# Patient Record
Sex: Female | Born: 1935 | Race: White | Hispanic: No | Marital: Married | Smoking: Former smoker
Health system: Southern US, Community
[De-identification: ages and names within clinical notes are randomized; demographics above are authoritative.]

## PROBLEM LIST (undated history)

## (undated) DIAGNOSIS — M5137 Other intervertebral disc degeneration, lumbosacral region: Secondary | ICD-10-CM

## (undated) DIAGNOSIS — M810 Age-related osteoporosis without current pathological fracture: Secondary | ICD-10-CM

## (undated) DIAGNOSIS — K31819 Angiodysplasia of stomach and duodenum without bleeding: Secondary | ICD-10-CM

## (undated) DIAGNOSIS — J449 Chronic obstructive pulmonary disease, unspecified: Secondary | ICD-10-CM

## (undated) DIAGNOSIS — I82409 Acute embolism and thrombosis of unspecified deep veins of unspecified lower extremity: Secondary | ICD-10-CM

## (undated) DIAGNOSIS — Z8542 Personal history of malignant neoplasm of other parts of uterus: Secondary | ICD-10-CM

## (undated) DIAGNOSIS — E785 Hyperlipidemia, unspecified: Secondary | ICD-10-CM

## (undated) DIAGNOSIS — J45909 Unspecified asthma, uncomplicated: Secondary | ICD-10-CM

## (undated) DIAGNOSIS — N6019 Diffuse cystic mastopathy of unspecified breast: Secondary | ICD-10-CM

## (undated) DIAGNOSIS — I10 Essential (primary) hypertension: Secondary | ICD-10-CM

## (undated) HISTORY — DX: Unspecified asthma, uncomplicated: J45.909

## (undated) HISTORY — DX: Personal history of malignant neoplasm of other parts of uterus: Z85.42

## (undated) HISTORY — DX: Other intervertebral disc degeneration, lumbosacral region: M51.37

## (undated) HISTORY — DX: Acute embolism and thrombosis of unspecified deep veins of unspecified lower extremity: I82.409

## (undated) HISTORY — DX: Angiodysplasia of stomach and duodenum without bleeding: K31.819

## (undated) HISTORY — PX: APPENDECTOMY: SHX54

## (undated) HISTORY — DX: Hyperlipidemia, unspecified: E78.5

## (undated) HISTORY — PX: CHOLECYSTECTOMY: SHX55

## (undated) HISTORY — DX: Age-related osteoporosis without current pathological fracture: M81.0

## (undated) HISTORY — DX: Chronic obstructive pulmonary disease, unspecified: J44.9

## (undated) HISTORY — DX: Diffuse cystic mastopathy of unspecified breast: N60.19

## (undated) HISTORY — DX: Essential (primary) hypertension: I10

---

## 2003-07-15 DIAGNOSIS — I82409 Acute embolism and thrombosis of unspecified deep veins of unspecified lower extremity: Secondary | ICD-10-CM

## 2003-07-15 HISTORY — DX: Acute embolism and thrombosis of unspecified deep veins of unspecified lower extremity: I82.409

## 2004-04-13 ENCOUNTER — Ambulatory Visit: Payer: Self-pay | Admitting: Internal Medicine

## 2004-04-15 ENCOUNTER — Ambulatory Visit: Payer: Self-pay | Admitting: Gastroenterology

## 2004-05-14 ENCOUNTER — Ambulatory Visit: Payer: Self-pay | Admitting: Internal Medicine

## 2004-06-13 ENCOUNTER — Ambulatory Visit: Payer: Self-pay | Admitting: Internal Medicine

## 2004-07-14 ENCOUNTER — Ambulatory Visit: Payer: Self-pay | Admitting: Internal Medicine

## 2004-08-14 ENCOUNTER — Ambulatory Visit: Payer: Self-pay | Admitting: Internal Medicine

## 2004-09-11 ENCOUNTER — Ambulatory Visit: Payer: Self-pay | Admitting: Internal Medicine

## 2004-10-12 ENCOUNTER — Ambulatory Visit: Payer: Self-pay | Admitting: Internal Medicine

## 2004-11-11 ENCOUNTER — Ambulatory Visit: Payer: Self-pay | Admitting: Internal Medicine

## 2004-12-02 ENCOUNTER — Ambulatory Visit: Payer: Self-pay | Admitting: Pain Medicine

## 2004-12-04 ENCOUNTER — Ambulatory Visit: Payer: Self-pay | Admitting: Pain Medicine

## 2004-12-12 ENCOUNTER — Ambulatory Visit: Payer: Self-pay | Admitting: Internal Medicine

## 2004-12-23 ENCOUNTER — Ambulatory Visit: Payer: Self-pay | Admitting: Pain Medicine

## 2004-12-24 ENCOUNTER — Ambulatory Visit: Payer: Self-pay | Admitting: Pain Medicine

## 2005-01-08 ENCOUNTER — Ambulatory Visit: Payer: Self-pay | Admitting: Pain Medicine

## 2005-01-11 ENCOUNTER — Ambulatory Visit: Payer: Self-pay | Admitting: Internal Medicine

## 2005-01-16 ENCOUNTER — Ambulatory Visit: Payer: Self-pay | Admitting: Pain Medicine

## 2005-01-30 ENCOUNTER — Ambulatory Visit: Payer: Self-pay | Admitting: Pain Medicine

## 2005-02-11 ENCOUNTER — Ambulatory Visit: Payer: Self-pay | Admitting: Internal Medicine

## 2005-02-18 ENCOUNTER — Ambulatory Visit: Payer: Self-pay | Admitting: Physician Assistant

## 2005-03-14 ENCOUNTER — Ambulatory Visit: Payer: Self-pay | Admitting: Internal Medicine

## 2005-04-13 ENCOUNTER — Ambulatory Visit: Payer: Self-pay | Admitting: Internal Medicine

## 2005-04-24 ENCOUNTER — Ambulatory Visit: Payer: Self-pay | Admitting: Internal Medicine

## 2005-05-14 ENCOUNTER — Ambulatory Visit: Payer: Self-pay | Admitting: Physician Assistant

## 2005-05-20 ENCOUNTER — Ambulatory Visit: Payer: Self-pay | Admitting: Internal Medicine

## 2005-05-26 ENCOUNTER — Ambulatory Visit: Payer: Self-pay | Admitting: Physician Assistant

## 2005-05-27 ENCOUNTER — Ambulatory Visit: Payer: Self-pay | Admitting: Pain Medicine

## 2005-06-12 ENCOUNTER — Ambulatory Visit: Payer: Self-pay | Admitting: Physician Assistant

## 2005-06-13 ENCOUNTER — Ambulatory Visit: Payer: Self-pay | Admitting: Internal Medicine

## 2005-07-14 ENCOUNTER — Ambulatory Visit: Payer: Self-pay | Admitting: Internal Medicine

## 2005-08-07 ENCOUNTER — Ambulatory Visit: Payer: Self-pay | Admitting: Internal Medicine

## 2005-08-14 ENCOUNTER — Ambulatory Visit: Payer: Self-pay | Admitting: Internal Medicine

## 2005-08-19 ENCOUNTER — Ambulatory Visit: Payer: Self-pay | Admitting: Internal Medicine

## 2005-09-11 ENCOUNTER — Ambulatory Visit: Payer: Self-pay | Admitting: Internal Medicine

## 2005-11-04 ENCOUNTER — Ambulatory Visit: Payer: Self-pay | Admitting: Internal Medicine

## 2005-11-11 ENCOUNTER — Ambulatory Visit: Payer: Self-pay | Admitting: Internal Medicine

## 2005-12-12 ENCOUNTER — Ambulatory Visit: Payer: Self-pay | Admitting: Internal Medicine

## 2006-01-11 ENCOUNTER — Ambulatory Visit: Payer: Self-pay | Admitting: Internal Medicine

## 2006-02-11 ENCOUNTER — Ambulatory Visit: Payer: Self-pay | Admitting: Internal Medicine

## 2006-03-14 ENCOUNTER — Ambulatory Visit: Payer: Self-pay | Admitting: Internal Medicine

## 2006-04-13 ENCOUNTER — Ambulatory Visit: Payer: Self-pay | Admitting: Internal Medicine

## 2006-04-30 ENCOUNTER — Ambulatory Visit: Payer: Self-pay | Admitting: Internal Medicine

## 2006-05-14 ENCOUNTER — Ambulatory Visit: Payer: Self-pay | Admitting: Internal Medicine

## 2006-07-21 ENCOUNTER — Ambulatory Visit: Payer: Self-pay | Admitting: Internal Medicine

## 2006-08-14 ENCOUNTER — Ambulatory Visit: Payer: Self-pay | Admitting: Internal Medicine

## 2006-10-20 ENCOUNTER — Ambulatory Visit: Payer: Self-pay | Admitting: Internal Medicine

## 2006-11-12 ENCOUNTER — Ambulatory Visit: Payer: Self-pay | Admitting: Internal Medicine

## 2006-12-02 ENCOUNTER — Ambulatory Visit: Payer: Self-pay | Admitting: Pain Medicine

## 2006-12-15 ENCOUNTER — Ambulatory Visit: Payer: Self-pay | Admitting: Pain Medicine

## 2006-12-29 ENCOUNTER — Ambulatory Visit: Payer: Self-pay | Admitting: Physician Assistant

## 2007-01-21 ENCOUNTER — Ambulatory Visit: Payer: Self-pay | Admitting: Internal Medicine

## 2007-02-12 ENCOUNTER — Ambulatory Visit: Payer: Self-pay | Admitting: Internal Medicine

## 2007-04-14 ENCOUNTER — Ambulatory Visit: Payer: Self-pay | Admitting: Internal Medicine

## 2007-04-20 ENCOUNTER — Ambulatory Visit: Payer: Self-pay | Admitting: Internal Medicine

## 2007-05-06 ENCOUNTER — Ambulatory Visit: Payer: Self-pay | Admitting: Internal Medicine

## 2007-05-15 ENCOUNTER — Ambulatory Visit: Payer: Self-pay | Admitting: Internal Medicine

## 2007-08-24 ENCOUNTER — Ambulatory Visit: Payer: Self-pay | Admitting: Oncology

## 2007-09-12 ENCOUNTER — Ambulatory Visit: Payer: Self-pay | Admitting: Oncology

## 2007-12-14 ENCOUNTER — Ambulatory Visit: Payer: Self-pay | Admitting: Internal Medicine

## 2008-01-12 ENCOUNTER — Ambulatory Visit: Payer: Self-pay | Admitting: Internal Medicine

## 2008-04-11 ENCOUNTER — Ambulatory Visit: Payer: Self-pay | Admitting: Internal Medicine

## 2008-04-13 ENCOUNTER — Ambulatory Visit: Payer: Self-pay | Admitting: Internal Medicine

## 2008-05-15 ENCOUNTER — Ambulatory Visit: Payer: Self-pay | Admitting: Internal Medicine

## 2008-05-16 ENCOUNTER — Inpatient Hospital Stay: Payer: Self-pay | Admitting: Surgery

## 2008-09-18 ENCOUNTER — Inpatient Hospital Stay: Payer: Self-pay | Admitting: Internal Medicine

## 2008-10-11 ENCOUNTER — Ambulatory Visit: Payer: Self-pay | Admitting: Internal Medicine

## 2008-10-12 ENCOUNTER — Ambulatory Visit: Payer: Self-pay | Admitting: Internal Medicine

## 2008-10-19 ENCOUNTER — Ambulatory Visit: Payer: Self-pay | Admitting: Internal Medicine

## 2008-11-11 ENCOUNTER — Ambulatory Visit: Payer: Self-pay | Admitting: Internal Medicine

## 2008-12-12 ENCOUNTER — Ambulatory Visit: Payer: Self-pay | Admitting: Internal Medicine

## 2008-12-19 ENCOUNTER — Ambulatory Visit: Payer: Self-pay | Admitting: Internal Medicine

## 2009-01-11 ENCOUNTER — Ambulatory Visit: Payer: Self-pay | Admitting: Internal Medicine

## 2009-02-11 ENCOUNTER — Ambulatory Visit: Payer: Self-pay | Admitting: Internal Medicine

## 2009-03-14 ENCOUNTER — Ambulatory Visit: Payer: Self-pay | Admitting: Internal Medicine

## 2009-04-13 ENCOUNTER — Ambulatory Visit: Payer: Self-pay | Admitting: Internal Medicine

## 2009-05-14 ENCOUNTER — Ambulatory Visit: Payer: Self-pay | Admitting: Internal Medicine

## 2009-06-13 ENCOUNTER — Ambulatory Visit: Payer: Self-pay | Admitting: Internal Medicine

## 2009-07-14 ENCOUNTER — Ambulatory Visit: Payer: Self-pay | Admitting: Internal Medicine

## 2009-08-06 ENCOUNTER — Ambulatory Visit: Payer: Self-pay | Admitting: Internal Medicine

## 2009-08-14 ENCOUNTER — Ambulatory Visit: Payer: Self-pay | Admitting: Internal Medicine

## 2009-09-11 ENCOUNTER — Ambulatory Visit: Payer: Self-pay | Admitting: Internal Medicine

## 2009-10-08 ENCOUNTER — Ambulatory Visit: Payer: Self-pay | Admitting: Internal Medicine

## 2009-10-12 ENCOUNTER — Ambulatory Visit: Payer: Self-pay | Admitting: Internal Medicine

## 2009-11-11 ENCOUNTER — Ambulatory Visit: Payer: Self-pay | Admitting: Internal Medicine

## 2009-11-29 ENCOUNTER — Ambulatory Visit: Payer: Self-pay | Admitting: Internal Medicine

## 2009-12-12 ENCOUNTER — Ambulatory Visit: Payer: Self-pay | Admitting: Internal Medicine

## 2010-01-11 ENCOUNTER — Ambulatory Visit: Payer: Self-pay | Admitting: Internal Medicine

## 2010-02-11 ENCOUNTER — Ambulatory Visit: Payer: Self-pay | Admitting: Internal Medicine

## 2010-03-14 ENCOUNTER — Ambulatory Visit: Payer: Self-pay | Admitting: Internal Medicine

## 2010-04-13 ENCOUNTER — Ambulatory Visit: Payer: Self-pay | Admitting: Internal Medicine

## 2010-05-14 ENCOUNTER — Ambulatory Visit: Payer: Self-pay | Admitting: Internal Medicine

## 2010-06-13 ENCOUNTER — Ambulatory Visit: Payer: Self-pay | Admitting: Internal Medicine

## 2010-07-14 ENCOUNTER — Ambulatory Visit: Payer: Self-pay | Admitting: Internal Medicine

## 2010-08-14 ENCOUNTER — Ambulatory Visit: Payer: Self-pay | Admitting: Internal Medicine

## 2010-09-12 ENCOUNTER — Ambulatory Visit: Payer: Self-pay | Admitting: Internal Medicine

## 2010-10-13 ENCOUNTER — Ambulatory Visit: Payer: Self-pay | Admitting: Internal Medicine

## 2010-11-12 ENCOUNTER — Ambulatory Visit: Payer: Self-pay | Admitting: Internal Medicine

## 2010-12-13 ENCOUNTER — Ambulatory Visit: Payer: Self-pay | Admitting: Internal Medicine

## 2011-01-12 ENCOUNTER — Ambulatory Visit: Payer: Self-pay | Admitting: Internal Medicine

## 2011-01-23 ENCOUNTER — Ambulatory Visit: Payer: Self-pay | Admitting: Internal Medicine

## 2011-02-12 ENCOUNTER — Ambulatory Visit: Payer: Self-pay | Admitting: Internal Medicine

## 2011-03-06 ENCOUNTER — Ambulatory Visit: Payer: Self-pay | Admitting: Otolaryngology

## 2011-03-15 ENCOUNTER — Ambulatory Visit: Payer: Self-pay | Admitting: Internal Medicine

## 2011-05-20 ENCOUNTER — Ambulatory Visit: Payer: Self-pay | Admitting: Internal Medicine

## 2011-05-21 ENCOUNTER — Ambulatory Visit: Payer: Self-pay | Admitting: Internal Medicine

## 2011-05-30 ENCOUNTER — Inpatient Hospital Stay: Payer: Self-pay | Admitting: Internal Medicine

## 2011-06-14 ENCOUNTER — Ambulatory Visit: Payer: Self-pay | Admitting: Internal Medicine

## 2011-08-12 ENCOUNTER — Ambulatory Visit: Payer: Self-pay | Admitting: Internal Medicine

## 2011-08-12 LAB — CBC CANCER CENTER
Basophil #: 0 x10 3/mm (ref 0.0–0.1)
Eosinophil %: 1.8 %
Eosinophil: 1 %
HCT: 32.9 % — ABNORMAL LOW (ref 35.0–47.0)
Lymphocyte %: 23.3 %
Monocyte %: 35.9 %
Monocytes: 30 %
Neutrophil #: 3.4 x10 3/mm (ref 1.4–6.5)
Platelet: 186 x10 3/mm (ref 150–440)
RBC: 3.78 10*6/uL — ABNORMAL LOW (ref 3.80–5.20)
RDW: 15.9 % — ABNORMAL HIGH (ref 11.5–14.5)
Segmented Neutrophils: 44 %
WBC: 8.9 x10 3/mm (ref 3.6–11.0)

## 2011-08-12 LAB — IRON AND TIBC
Iron Bind.Cap.(Total): 396 ug/dL (ref 250–450)
Iron Saturation: 16 %
Iron: 62 ug/dL (ref 50–170)

## 2011-08-15 ENCOUNTER — Ambulatory Visit: Payer: Self-pay | Admitting: Internal Medicine

## 2011-08-22 ENCOUNTER — Ambulatory Visit: Payer: Self-pay | Admitting: Internal Medicine

## 2011-09-30 ENCOUNTER — Ambulatory Visit: Payer: Self-pay | Admitting: Internal Medicine

## 2011-09-30 LAB — CANCER CENTER HEMOGLOBIN: HGB: 10.5 g/dL — ABNORMAL LOW (ref 12.0–16.0)

## 2011-10-13 ENCOUNTER — Ambulatory Visit: Payer: Self-pay | Admitting: Internal Medicine

## 2011-11-04 LAB — IRON AND TIBC
Iron Bind.Cap.(Total): 435 ug/dL (ref 250–450)
Iron: 27 ug/dL — ABNORMAL LOW (ref 50–170)
Unbound Iron-Bind.Cap.: 408 ug/dL

## 2011-11-04 LAB — CANCER CENTER HEMOGLOBIN: HGB: 10.2 g/dL — ABNORMAL LOW (ref 12.0–16.0)

## 2011-11-04 LAB — FERRITIN: Ferritin (ARMC): 11 ng/mL (ref 8–388)

## 2011-11-12 ENCOUNTER — Ambulatory Visit: Payer: Self-pay | Admitting: Internal Medicine

## 2011-12-13 ENCOUNTER — Ambulatory Visit: Payer: Self-pay | Admitting: Internal Medicine

## 2011-12-16 LAB — CANCER CENTER HEMOGLOBIN: HGB: 10.6 g/dL — ABNORMAL LOW (ref 12.0–16.0)

## 2012-01-12 ENCOUNTER — Ambulatory Visit: Payer: Self-pay | Admitting: Internal Medicine

## 2012-01-27 LAB — CBC CANCER CENTER
Eosinophil: 2 %
HCT: 34.2 % — ABNORMAL LOW (ref 35.0–47.0)
HGB: 10.9 g/dL — ABNORMAL LOW (ref 12.0–16.0)
Lymphocytes: 18 %
MCHC: 31.9 g/dL — ABNORMAL LOW (ref 32.0–36.0)
Monocytes: 35 %
RBC: 3.97 10*6/uL (ref 3.80–5.20)
RDW: 16.9 % — ABNORMAL HIGH (ref 11.5–14.5)
Segmented Neutrophils: 45 %
WBC: 7.4 x10 3/mm (ref 3.6–11.0)

## 2012-01-27 LAB — IRON AND TIBC
Iron Saturation: 24 %
Iron: 76 ug/dL (ref 50–170)
Unbound Iron-Bind.Cap.: 247 ug/dL

## 2012-01-27 LAB — RETICULOCYTES: Absolute Retic Count: 0.0662 10*6/uL (ref 0.024–0.084)

## 2012-01-27 LAB — FERRITIN: Ferritin (ARMC): 116 ng/mL (ref 8–388)

## 2012-02-12 ENCOUNTER — Ambulatory Visit: Payer: Self-pay | Admitting: Internal Medicine

## 2012-02-24 LAB — CANCER CENTER HEMOGLOBIN: HGB: 10.7 g/dL — ABNORMAL LOW (ref 12.0–16.0)

## 2012-03-14 ENCOUNTER — Ambulatory Visit: Payer: Self-pay | Admitting: Internal Medicine

## 2012-03-23 LAB — CANCER CENTER HEMOGLOBIN: HGB: 10.9 g/dL — ABNORMAL LOW (ref 12.0–16.0)

## 2012-04-13 ENCOUNTER — Ambulatory Visit: Payer: Self-pay | Admitting: Internal Medicine

## 2012-04-20 LAB — IRON AND TIBC
Iron Saturation: 15 %
Unbound Iron-Bind.Cap.: 304 ug/dL

## 2012-04-20 LAB — CANCER CENTER HEMOGLOBIN: HGB: 11 g/dL — ABNORMAL LOW (ref 12.0–16.0)

## 2012-05-13 ENCOUNTER — Ambulatory Visit: Payer: Self-pay | Admitting: Internal Medicine

## 2012-05-14 ENCOUNTER — Ambulatory Visit: Payer: Self-pay | Admitting: Internal Medicine

## 2012-06-13 ENCOUNTER — Ambulatory Visit: Payer: Self-pay | Admitting: Internal Medicine

## 2012-06-15 LAB — CANCER CENTER HEMOGLOBIN: HGB: 10.9 g/dL — ABNORMAL LOW (ref 12.0–16.0)

## 2012-07-13 LAB — IRON AND TIBC
Iron Bind.Cap.(Total): 302 ug/dL (ref 250–450)
Iron: 76 ug/dL (ref 50–170)
Unbound Iron-Bind.Cap.: 226 ug/dL

## 2012-07-13 LAB — RETICULOCYTES
Absolute Retic Count: 0.0452 10*6/uL (ref 0.023–0.096)
Reticulocyte: 1.16 % (ref 0.5–2.2)

## 2012-07-13 LAB — CBC CANCER CENTER
Comment - H1-Com1: NORMAL
Eosinophil: 1 %
HCT: 32.5 % — ABNORMAL LOW (ref 35.0–47.0)
HGB: 10.9 g/dL — ABNORMAL LOW (ref 12.0–16.0)
MCH: 28 pg (ref 26.0–34.0)
MCHC: 33.6 g/dL (ref 32.0–36.0)
MCV: 83 fL (ref 80–100)
Monocytes: 27 %
Platelet: 185 x10 3/mm (ref 150–440)
RBC: 3.9 10*6/uL (ref 3.80–5.20)
Variant Lymphocyte: 7 %

## 2012-07-14 ENCOUNTER — Ambulatory Visit: Payer: Self-pay | Admitting: Internal Medicine

## 2012-08-16 ENCOUNTER — Ambulatory Visit: Payer: Self-pay | Admitting: Internal Medicine

## 2012-08-17 LAB — CANCER CENTER HEMOGLOBIN: HGB: 11.1 g/dL — ABNORMAL LOW (ref 12.0–16.0)

## 2012-09-07 LAB — CANCER CENTER HEMOGLOBIN: HGB: 11.8 g/dL — ABNORMAL LOW (ref 12.0–16.0)

## 2012-09-11 ENCOUNTER — Ambulatory Visit: Payer: Self-pay | Admitting: Internal Medicine

## 2012-10-05 LAB — CANCER CENTER HEMOGLOBIN: HGB: 11.4 g/dL — ABNORMAL LOW (ref 12.0–16.0)

## 2012-10-12 ENCOUNTER — Ambulatory Visit: Payer: Self-pay | Admitting: Internal Medicine

## 2012-11-02 LAB — CANCER CENTER HEMOGLOBIN: HGB: 10.9 g/dL — ABNORMAL LOW (ref 12.0–16.0)

## 2012-11-11 ENCOUNTER — Ambulatory Visit: Payer: Self-pay | Admitting: Internal Medicine

## 2012-11-30 LAB — IRON AND TIBC
Iron Bind.Cap.(Total): 335 ug/dL (ref 250–450)
Iron Saturation: 20 %
Iron: 66 ug/dL (ref 50–170)
Unbound Iron-Bind.Cap.: 269 ug/dL

## 2012-11-30 LAB — CANCER CENTER HEMOGLOBIN: HGB: 11 g/dL — ABNORMAL LOW (ref 12.0–16.0)

## 2012-11-30 LAB — FERRITIN: Ferritin (ARMC): 83 ng/mL (ref 8–388)

## 2012-12-12 ENCOUNTER — Ambulatory Visit: Payer: Self-pay | Admitting: Internal Medicine

## 2012-12-19 ENCOUNTER — Emergency Department: Payer: Self-pay | Admitting: Emergency Medicine

## 2012-12-19 LAB — CBC WITH DIFFERENTIAL/PLATELET
Basophil %: 0.4 %
Eosinophil #: 0.1 10*3/uL (ref 0.0–0.7)
HCT: 30.5 % — ABNORMAL LOW (ref 35.0–47.0)
HGB: 9.9 g/dL — ABNORMAL LOW (ref 12.0–16.0)
Lymphocyte #: 1.4 10*3/uL (ref 1.0–3.6)
Lymphocyte %: 11.7 %
MCH: 27 pg (ref 26.0–34.0)
MCHC: 32.5 g/dL (ref 32.0–36.0)
Monocyte #: 4.4 x10 3/mm — ABNORMAL HIGH (ref 0.2–0.9)
Monocyte %: 37.7 %
RBC: 3.66 10*6/uL — ABNORMAL LOW (ref 3.80–5.20)

## 2012-12-19 LAB — BASIC METABOLIC PANEL
Anion Gap: 7 (ref 7–16)
BUN: 16 mg/dL (ref 7–18)
Calcium, Total: 8.8 mg/dL (ref 8.5–10.1)
Glucose: 116 mg/dL — ABNORMAL HIGH (ref 65–99)
Potassium: 4 mmol/L (ref 3.5–5.1)
Sodium: 139 mmol/L (ref 136–145)

## 2012-12-19 LAB — URIC ACID: Uric Acid: 5.6 mg/dL (ref 2.6–6.0)

## 2012-12-19 LAB — SYNOVIAL FLUID, CRYSTAL: Crystals, Joint Fluid: NONE SEEN

## 2012-12-23 LAB — BODY FLUID CULTURE

## 2013-01-04 LAB — CBC CANCER CENTER
Eosinophil #: 0.1 x10 3/mm (ref 0.0–0.7)
Eosinophil %: 0.9 %
HCT: 30.4 % — ABNORMAL LOW (ref 35.0–47.0)
HGB: 10.3 g/dL — ABNORMAL LOW (ref 12.0–16.0)
MCH: 28.7 pg (ref 26.0–34.0)
MCHC: 33.8 g/dL (ref 32.0–36.0)
MCV: 85 fL (ref 80–100)
Monocyte %: 41.1 %
Neutrophil #: 3.4 x10 3/mm (ref 1.4–6.5)
Platelet: 163 x10 3/mm (ref 150–440)
RBC: 3.59 10*6/uL — ABNORMAL LOW (ref 3.80–5.20)
WBC: 9.1 x10 3/mm (ref 3.6–11.0)

## 2013-01-04 LAB — RETICULOCYTES: Absolute Retic Count: 0.0736 10*6/uL

## 2013-01-11 ENCOUNTER — Ambulatory Visit: Payer: Self-pay | Admitting: Internal Medicine

## 2013-02-01 LAB — CANCER CENTER HEMOGLOBIN: HGB: 10.5 g/dL — ABNORMAL LOW (ref 12.0–16.0)

## 2013-02-11 ENCOUNTER — Ambulatory Visit: Payer: Self-pay | Admitting: Internal Medicine

## 2013-03-14 ENCOUNTER — Ambulatory Visit: Payer: Self-pay | Admitting: Internal Medicine

## 2013-03-29 LAB — IRON AND TIBC
Iron Bind.Cap.(Total): 324 ug/dL (ref 250–450)
Iron Saturation: 12 %
Iron: 39 ug/dL — ABNORMAL LOW (ref 50–170)

## 2013-03-29 LAB — CANCER CENTER HEMOGLOBIN: HGB: 11.5 g/dL — ABNORMAL LOW (ref 12.0–16.0)

## 2013-03-29 LAB — FERRITIN: Ferritin (ARMC): 70 ng/mL (ref 8–388)

## 2013-04-13 ENCOUNTER — Ambulatory Visit: Payer: Self-pay | Admitting: Internal Medicine

## 2013-04-26 LAB — CANCER CENTER HEMOGLOBIN: HGB: 12 g/dL (ref 12.0–16.0)

## 2013-05-12 IMAGING — MG MM CAD SCREENING MAMMO
1 series · 6 of 6 positions shown · non-contrast
Comparison: none

REASON FOR EXAM: scr mammo no order
COMMENTS:

[R CC · right · 6 of 6 slices shown]
[im 1/6]
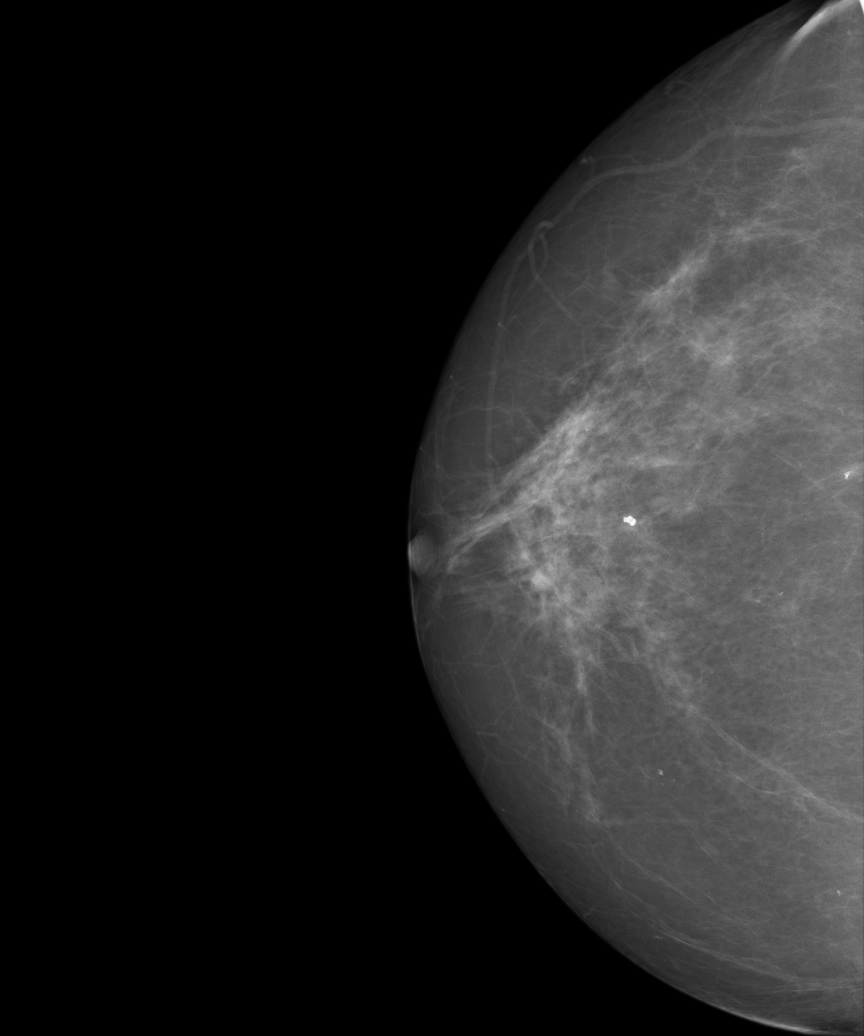
[im 2/6]
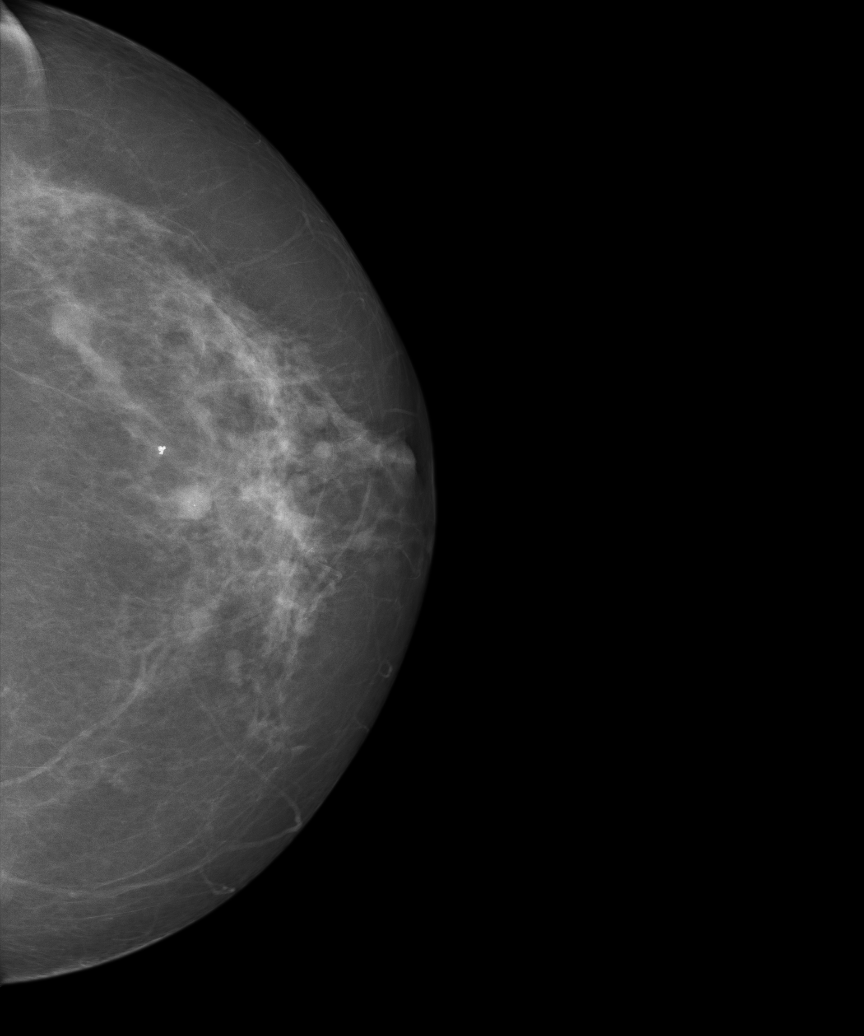
[im 3/6]
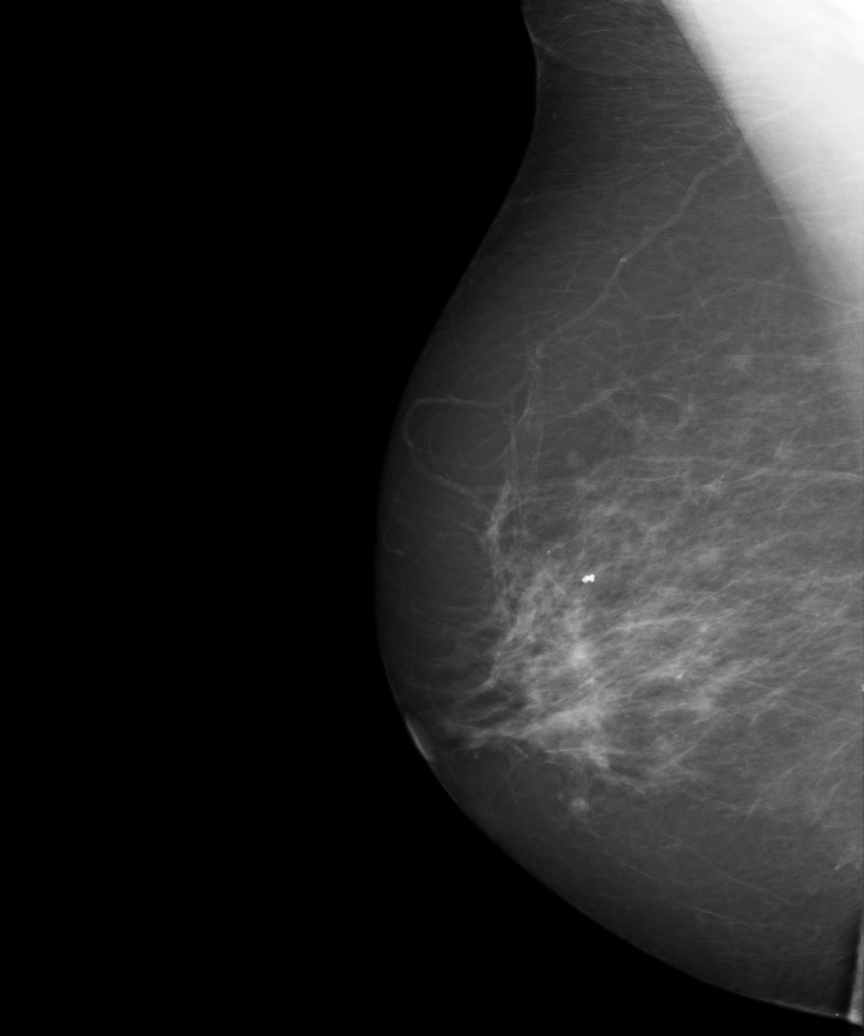
[im 4/6]
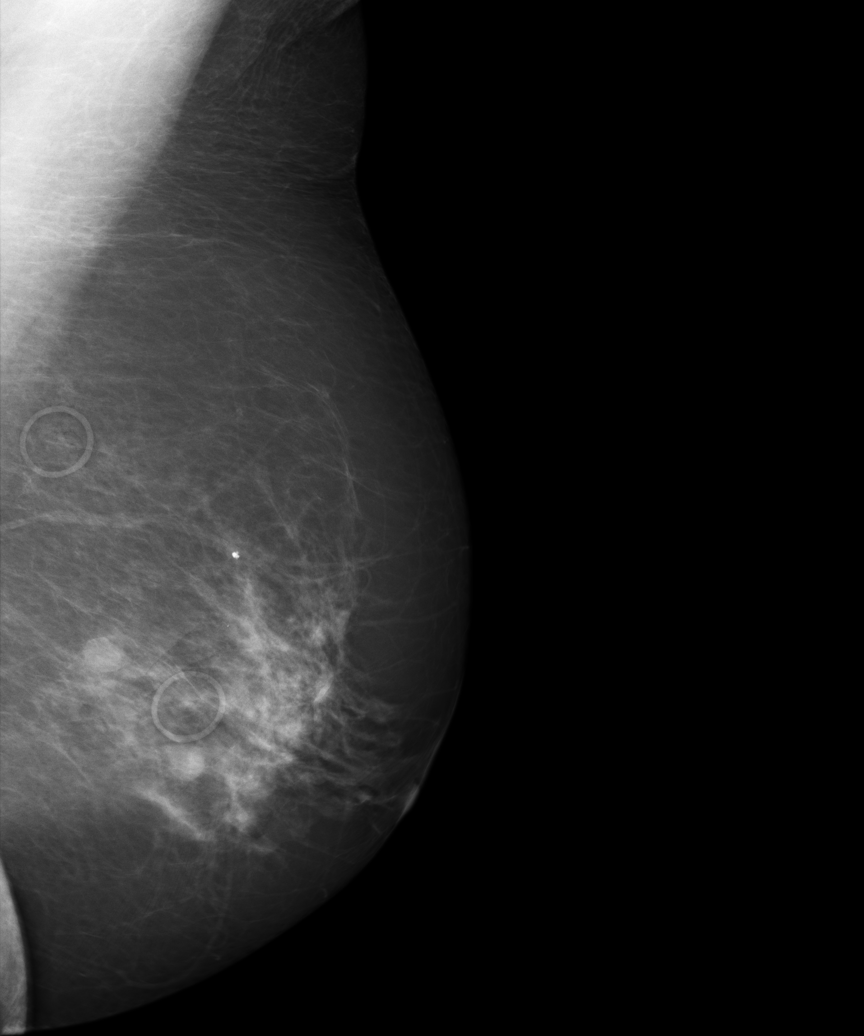
[im 5/6]
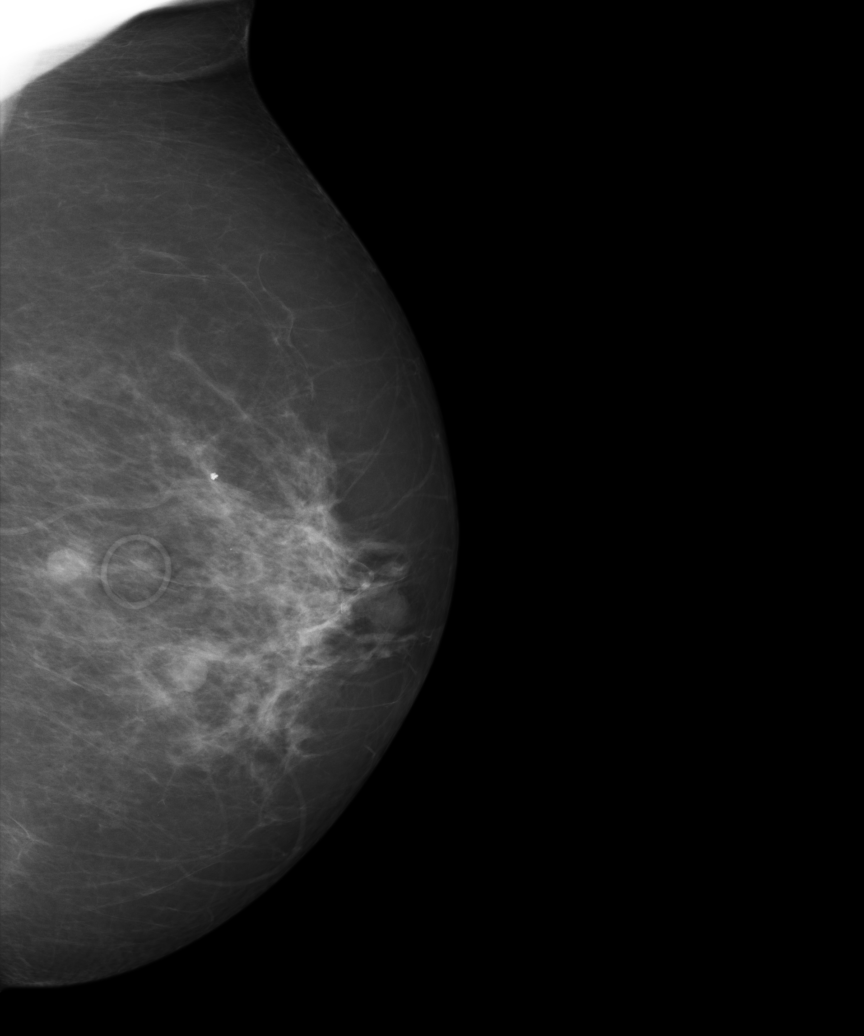
[im 6/6]
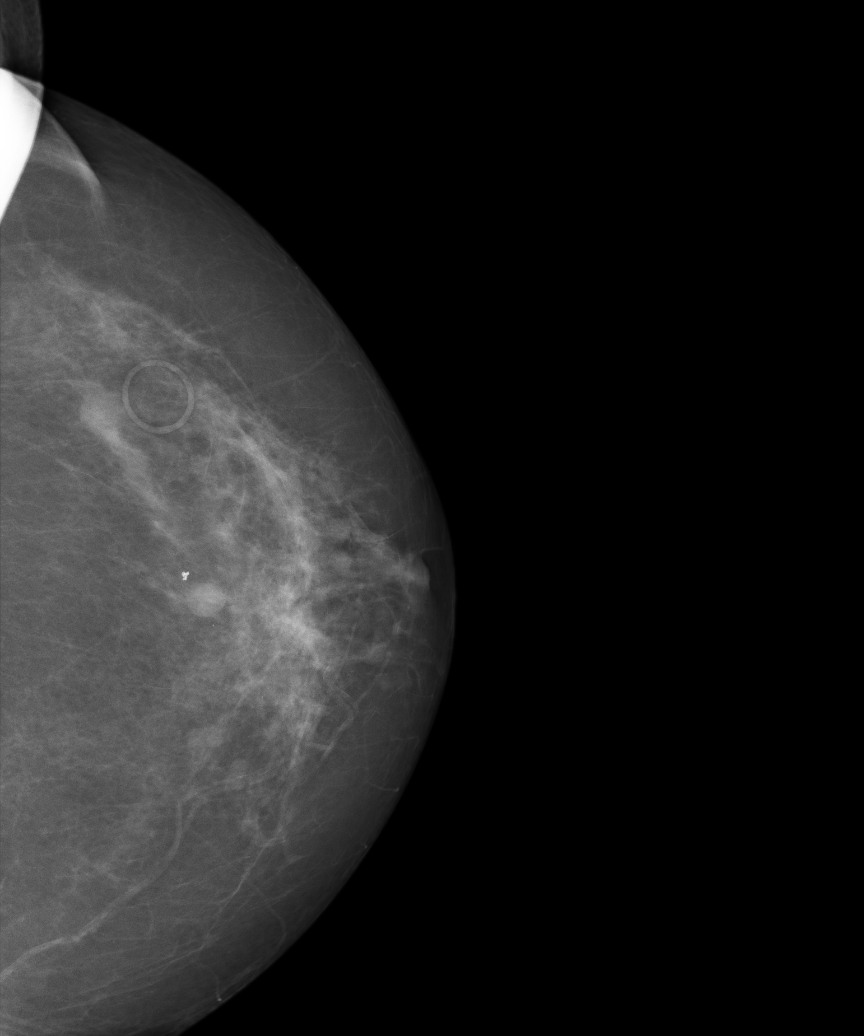

[6 of 6 positions shown; findings below may reference images not displayed]

PROCEDURE:     MAM - MAM DGTL SCRN MAM NO ORDER W/CAD  - May 13, 2012  [DATE]

RESULT:     Comparison is made to prior studies dated 01/23/2011, 11/29/2009.

The breasts are moderately dense demonstrating a heterogeneous parenchymal
pattern with a component of nodularity which, when compared to the previous
studies dating back to 12/08/2002, has a waxing and waning appearance. There
is no radiographic evidence to suggest malignancy. Benign appearing
calcifications are identified within the right and left breast.
IMPRESSION: BI-RADS: Category 2 - Benign Finding

Thank you for this opportunity to contribute to the care of your patient.

A NEGATIVE MAMMOGRAM REPORT DOES NOT PRECLUDE BIOPSY OR OTHER EVALUATION OF
A CLINICALLY PALPABLE OR OTHERWISE SUSPICIOUS MASS OR LESION. BREAST CANCER
MAY NOT BE DETECTED BY MAMMOGRAPHY IN UP TO 10% OF CASES.

## 2013-05-14 ENCOUNTER — Ambulatory Visit: Payer: Self-pay | Admitting: Internal Medicine

## 2013-06-13 ENCOUNTER — Ambulatory Visit: Payer: Self-pay | Admitting: Internal Medicine

## 2013-06-21 LAB — IRON AND TIBC
Iron Bind.Cap.(Total): 282 ug/dL (ref 250–450)
Iron Saturation: 23 %
Iron: 64 ug/dL (ref 50–170)
Unbound Iron-Bind.Cap.: 218 ug/dL

## 2013-06-21 LAB — CBC CANCER CENTER
Basophil %: 0.4 %
Eosinophil #: 0.1 x10 3/mm (ref 0.0–0.7)
Eosinophil %: 0.7 %
HCT: 38.4 % (ref 35.0–47.0)
HGB: 12.8 g/dL (ref 12.0–16.0)
Lymphocyte #: 2.3 x10 3/mm (ref 1.0–3.6)
Lymphocyte %: 25.4 %
MCH: 28.5 pg (ref 26.0–34.0)
MCHC: 33.2 g/dL (ref 32.0–36.0)
MCV: 86 fL (ref 80–100)
Monocyte #: 2.5 x10 3/mm — ABNORMAL HIGH (ref 0.2–0.9)
Neutrophil %: 44.7 %
Platelet: 169 x10 3/mm (ref 150–440)

## 2013-06-21 LAB — RETICULOCYTES: Reticulocyte: 1.21 % (ref 0.4–3.1)

## 2013-07-14 ENCOUNTER — Ambulatory Visit: Payer: Self-pay | Admitting: Internal Medicine

## 2013-08-25 ENCOUNTER — Ambulatory Visit: Payer: Self-pay | Admitting: Internal Medicine

## 2013-10-10 ENCOUNTER — Ambulatory Visit: Payer: Self-pay | Admitting: Internal Medicine

## 2013-10-11 LAB — FERRITIN: Ferritin (ARMC): 179 ng/mL (ref 8–388)

## 2013-10-11 LAB — IRON AND TIBC
IRON SATURATION: 22 %
IRON: 62 ug/dL (ref 50–170)
Iron Bind.Cap.(Total): 285 ug/dL (ref 250–450)
Unbound Iron-Bind.Cap.: 223 ug/dL

## 2013-10-11 LAB — HEMOGLOBIN: HGB: 11.4 g/dL — ABNORMAL LOW (ref 12.0–16.0)

## 2013-10-12 ENCOUNTER — Ambulatory Visit: Payer: Self-pay | Admitting: Internal Medicine

## 2013-11-11 DIAGNOSIS — I82409 Acute embolism and thrombosis of unspecified deep veins of unspecified lower extremity: Secondary | ICD-10-CM | POA: Insufficient documentation

## 2013-11-11 DIAGNOSIS — E785 Hyperlipidemia, unspecified: Secondary | ICD-10-CM | POA: Insufficient documentation

## 2013-11-11 DIAGNOSIS — K31819 Angiodysplasia of stomach and duodenum without bleeding: Secondary | ICD-10-CM | POA: Insufficient documentation

## 2013-11-11 DIAGNOSIS — M810 Age-related osteoporosis without current pathological fracture: Secondary | ICD-10-CM | POA: Insufficient documentation

## 2013-11-11 DIAGNOSIS — I10 Essential (primary) hypertension: Secondary | ICD-10-CM | POA: Insufficient documentation

## 2013-11-11 DIAGNOSIS — J449 Chronic obstructive pulmonary disease, unspecified: Secondary | ICD-10-CM | POA: Insufficient documentation

## 2014-01-31 ENCOUNTER — Ambulatory Visit: Payer: Self-pay | Admitting: Internal Medicine

## 2014-01-31 LAB — IRON AND TIBC
IRON SATURATION: 14 %
IRON: 39 ug/dL — AB (ref 50–170)
Iron Bind.Cap.(Total): 287 ug/dL (ref 250–450)
UNBOUND IRON-BIND. CAP.: 248 ug/dL

## 2014-01-31 LAB — HEMOGLOBIN: HGB: 11.2 g/dL — AB (ref 12.0–16.0)

## 2014-01-31 LAB — FERRITIN: Ferritin (ARMC): 92 ng/mL (ref 8–388)

## 2014-02-11 ENCOUNTER — Ambulatory Visit: Payer: Self-pay | Admitting: Internal Medicine

## 2014-03-14 ENCOUNTER — Ambulatory Visit: Payer: Self-pay | Admitting: Internal Medicine

## 2014-04-13 DIAGNOSIS — M51379 Other intervertebral disc degeneration, lumbosacral region without mention of lumbar back pain or lower extremity pain: Secondary | ICD-10-CM | POA: Insufficient documentation

## 2014-04-13 DIAGNOSIS — M5137 Other intervertebral disc degeneration, lumbosacral region: Secondary | ICD-10-CM | POA: Insufficient documentation

## 2014-05-23 ENCOUNTER — Ambulatory Visit: Payer: Self-pay | Admitting: Internal Medicine

## 2014-05-23 LAB — CBC CANCER CENTER
Bands: 3 %
Basophil: 3 %
HCT: 36.3 % (ref 35.0–47.0)
HGB: 11.8 g/dL — AB (ref 12.0–16.0)
Lymphocytes: 19 %
MCH: 28.5 pg (ref 26.0–34.0)
MCHC: 32.5 g/dL (ref 32.0–36.0)
MCV: 88 fL (ref 80–100)
Monocytes: 46 %
PLATELETS: 141 x10 3/mm — AB (ref 150–440)
RBC: 4.14 10*6/uL (ref 3.80–5.20)
RDW: 15.1 % — ABNORMAL HIGH (ref 11.5–14.5)
Segmented Neutrophils: 26 %
Variant Lymphocyte: 3 %
WBC: 6.9 x10 3/mm (ref 3.6–11.0)

## 2014-05-23 LAB — IRON AND TIBC
IRON BIND. CAP.(TOTAL): 281 ug/dL (ref 250–450)
IRON: 46 ug/dL — AB (ref 50–170)
Iron Saturation: 16 %
Unbound Iron-Bind.Cap.: 235 ug/dL

## 2014-05-23 LAB — RETICULOCYTES
ABSOLUTE RETIC COUNT: 0.0436 10*6/uL (ref 0.019–0.186)
RETICULOCYTE: 1.05 % (ref 0.4–3.1)

## 2014-05-23 LAB — FERRITIN: Ferritin (ARMC): 252 ng/mL (ref 8–388)

## 2014-06-13 ENCOUNTER — Ambulatory Visit: Payer: Self-pay | Admitting: Internal Medicine

## 2014-09-12 ENCOUNTER — Ambulatory Visit
Admit: 2014-09-12 | Disposition: A | Discharge status: Home / Self Care | Payer: Self-pay | Attending: Internal Medicine | Admitting: Internal Medicine

## 2014-09-21 ENCOUNTER — Ambulatory Visit: Payer: Self-pay | Admitting: Internal Medicine

## 2014-10-13 ENCOUNTER — Ambulatory Visit
Admit: 2014-10-13 | Disposition: A | Discharge status: Home / Self Care | Payer: Self-pay | Attending: Internal Medicine | Admitting: Internal Medicine

## 2015-01-01 ENCOUNTER — Other Ambulatory Visit: Payer: Self-pay

## 2015-01-01 DIAGNOSIS — D509 Iron deficiency anemia, unspecified: Secondary | ICD-10-CM

## 2015-01-02 ENCOUNTER — Inpatient Hospital Stay: Payer: Medicare Other

## 2015-01-02 ENCOUNTER — Inpatient Hospital Stay: Payer: Medicare Other | Attending: Internal Medicine

## 2015-01-02 VITALS — BP 136/71 | HR 79 | Temp 97.7°F | Resp 20

## 2015-01-02 DIAGNOSIS — D509 Iron deficiency anemia, unspecified: Secondary | ICD-10-CM | POA: Diagnosis not present

## 2015-01-02 DIAGNOSIS — Z79899 Other long term (current) drug therapy: Secondary | ICD-10-CM | POA: Diagnosis not present

## 2015-01-02 DIAGNOSIS — D500 Iron deficiency anemia secondary to blood loss (chronic): Secondary | ICD-10-CM | POA: Insufficient documentation

## 2015-01-02 LAB — IRON AND TIBC
Iron: 58 ug/dL (ref 28–170)
SATURATION RATIOS: 19 % (ref 10.4–31.8)
TIBC: 311 ug/dL (ref 250–450)
UIBC: 253 ug/dL

## 2015-01-02 LAB — HEMOGLOBIN: Hemoglobin: 10.4 g/dL — ABNORMAL LOW (ref 12.0–16.0)

## 2015-01-02 LAB — FERRITIN: Ferritin: 77 ng/mL (ref 11–307)

## 2015-01-02 LAB — VITAMIN B12: Vitamin B-12: 997 pg/mL — ABNORMAL HIGH (ref 180–914)

## 2015-01-02 LAB — SAMPLE TO BLOOD BANK

## 2015-01-02 LAB — FOLATE: Folate: 7.1 ng/mL (ref >5.9)

## 2015-01-02 MED ORDER — SODIUM CHLORIDE 0.9 % IV SOLN
100.0000 mg | Freq: Once | INTRAVENOUS | Status: AC
  Administered 2015-01-02: 100 mg via INTRAVENOUS
  Filled 2015-01-02: qty 5

## 2015-04-24 ENCOUNTER — Ambulatory Visit: Payer: Self-pay | Admitting: Internal Medicine

## 2015-04-24 ENCOUNTER — Other Ambulatory Visit: Payer: Self-pay

## 2015-04-24 ENCOUNTER — Ambulatory Visit: Payer: Self-pay

## 2015-04-30 ENCOUNTER — Ambulatory Visit: Payer: Self-pay

## 2015-04-30 ENCOUNTER — Ambulatory Visit: Payer: Self-pay | Admitting: Hematology and Oncology

## 2015-04-30 ENCOUNTER — Other Ambulatory Visit: Payer: Self-pay

## 2015-04-30 DIAGNOSIS — D509 Iron deficiency anemia, unspecified: Secondary | ICD-10-CM

## 2015-05-01 ENCOUNTER — Inpatient Hospital Stay: Payer: Medicare Other

## 2015-05-01 ENCOUNTER — Encounter: Payer: Self-pay | Admitting: Hematology and Oncology

## 2015-05-01 ENCOUNTER — Inpatient Hospital Stay: Payer: Medicare Other | Attending: Hematology and Oncology

## 2015-05-01 ENCOUNTER — Inpatient Hospital Stay (HOSPITAL_BASED_OUTPATIENT_CLINIC_OR_DEPARTMENT_OTHER): Payer: Medicare Other | Admitting: Hematology and Oncology

## 2015-05-01 VITALS — BP 153/72 | HR 81 | Temp 98.0°F | Resp 18 | Ht 60.0 in | Wt 138.9 lb

## 2015-05-01 VITALS — BP 151/68 | HR 76 | Resp 18

## 2015-05-01 DIAGNOSIS — Z86718 Personal history of other venous thrombosis and embolism: Secondary | ICD-10-CM | POA: Diagnosis not present

## 2015-05-01 DIAGNOSIS — K31819 Angiodysplasia of stomach and duodenum without bleeding: Secondary | ICD-10-CM

## 2015-05-01 DIAGNOSIS — I10 Essential (primary) hypertension: Secondary | ICD-10-CM | POA: Insufficient documentation

## 2015-05-01 DIAGNOSIS — Z87891 Personal history of nicotine dependence: Secondary | ICD-10-CM | POA: Insufficient documentation

## 2015-05-01 DIAGNOSIS — E785 Hyperlipidemia, unspecified: Secondary | ICD-10-CM | POA: Diagnosis not present

## 2015-05-01 DIAGNOSIS — J449 Chronic obstructive pulmonary disease, unspecified: Secondary | ICD-10-CM | POA: Diagnosis not present

## 2015-05-01 DIAGNOSIS — Z8542 Personal history of malignant neoplasm of other parts of uterus: Secondary | ICD-10-CM | POA: Diagnosis not present

## 2015-05-01 DIAGNOSIS — M1990 Unspecified osteoarthritis, unspecified site: Secondary | ICD-10-CM

## 2015-05-01 DIAGNOSIS — R7989 Other specified abnormal findings of blood chemistry: Secondary | ICD-10-CM | POA: Diagnosis not present

## 2015-05-01 DIAGNOSIS — M810 Age-related osteoporosis without current pathological fracture: Secondary | ICD-10-CM | POA: Insufficient documentation

## 2015-05-01 DIAGNOSIS — D509 Iron deficiency anemia, unspecified: Secondary | ICD-10-CM

## 2015-05-01 DIAGNOSIS — D500 Iron deficiency anemia secondary to blood loss (chronic): Secondary | ICD-10-CM

## 2015-05-01 DIAGNOSIS — Z79899 Other long term (current) drug therapy: Secondary | ICD-10-CM | POA: Insufficient documentation

## 2015-05-01 LAB — CBC WITH DIFFERENTIAL/PLATELET
Band Neutrophils: 3 %
Basophils Absolute: 0.2 10*3/uL — ABNORMAL HIGH (ref 0–0.1)
Basophils Relative: 2 %
Blasts: 0 %
Eosinophils Absolute: 0.2 10*3/uL (ref 0–0.7)
Eosinophils Relative: 2 %
HCT: 32.3 % — ABNORMAL LOW (ref 35.0–47.0)
Hemoglobin: 10.6 g/dL — ABNORMAL LOW (ref 12.0–16.0)
Lymphocytes Relative: 24 %
Lymphs Abs: 1.8 10*3/uL (ref 1.0–3.6)
MCH: 27.4 pg (ref 26.0–34.0)
MCHC: 32.7 g/dL (ref 32.0–36.0)
MCV: 83.6 fL (ref 80.0–100.0)
Metamyelocytes Relative: 0 %
Monocytes Absolute: 2.4 10*3/uL — ABNORMAL HIGH (ref 0.2–0.9)
Monocytes Relative: 32 %
Myelocytes: 0 %
Neutro Abs: 3 10*3/uL (ref 1.4–6.5)
Neutrophils Relative %: 37 %
Other: 0 %
Platelets: 242 10*3/uL (ref 150–440)
Promyelocytes Absolute: 0 %
RBC: 3.86 MIL/uL (ref 3.80–5.20)
RDW: 15.9 % — ABNORMAL HIGH (ref 11.5–14.5)
Smear Review: ADEQUATE
WBC: 7.6 10*3/uL (ref 3.6–11.0)
nRBC: 0 /100 WBC

## 2015-05-01 LAB — IRON AND TIBC
Iron: 42 ug/dL (ref 28–170)
Saturation Ratios: 13 % (ref 10.4–31.8)
TIBC: 330 ug/dL (ref 250–450)
UIBC: 288 ug/dL

## 2015-05-01 LAB — RETICULOCYTES
RBC.: 3.86 MIL/uL (ref 3.80–5.20)
Retic Count, Absolute: 38.6 10*3/uL (ref 19.0–183.0)
Retic Ct Pct: 1 % (ref 0.4–3.1)

## 2015-05-01 LAB — FERRITIN: Ferritin: 60 ng/mL (ref 11–307)

## 2015-05-01 MED ORDER — SODIUM CHLORIDE 0.9 % IV SOLN
INTRAVENOUS | Status: DC
  Administered 2015-05-01: 16:00:00 via INTRAVENOUS
  Filled 2015-05-01: qty 1000

## 2015-05-01 MED ORDER — SODIUM CHLORIDE 0.9 % IV SOLN
100.0000 mg | Freq: Once | INTRAVENOUS | Status: AC
  Administered 2015-05-01: 100 mg via INTRAVENOUS
  Filled 2015-05-01: qty 5

## 2015-05-01 NOTE — Progress Notes (Signed)
[facility] day:  05/01/2015  Chief Complaint: Margaret [name] is a 81 y.o. female with iron deficiency anemia who is seen for reassessment.  HPI:  She notes a history of anemia since her blood clots in 2005.  She notes a hemoglobin of 8 requiring transfusion.  She previously underwent colonoscopy and barium enema.  By report, studies were negative.  EGD revealed ectasias in stomach and duodenum.  She is felt to have anemia secondary to chronic GI blood loss most likely secondary to AV malformations.  She received a course of 1 gm of Venofer in 01/2014 an 02/2014.  She states that she gets a bag of iron for 15 minutes every 3 months.  Notes from Dr. Ma Hillock indicate maintenance low dose Venofer 100 gm IV every 16 weeks.  The patient was last seen by Dr. Ma Hillock on 06/02/2014.  Labs on 01/02/2015 revealed a ferritin was 77.  Iron studies were normal with a saturation of 19% and a TIBC of 311.  Hematocrit was 32.3 with a hemoglobin of 10.6.  She received Venofer 100 mg on 01/02/2015.  Additional labs included a B12 of 997 and a folate of 7.1.  She previously received Procrit 20,000 units every week then every other week to maintain her hemoglobin.  Diet is good.  She eats meat or chicken 3 times a week.  She eats green leafy vegetables.  She denies pica.  She has a history of recurrent right lower extremity deep venous thrombosis in 10/2003 and 072005.  She was on Coumadin.  She has not been on blood thinners for 8 years.  Per Dr Ma Hillock, DVT was unprovoked.  Hypercoagulable work-up noted a mildly elevated anticardiolipin IgM of 32.   Symptomatically, she notes chronic shortness of breath due to her COPD.  She stopped smoking 17 years ago.  She notes a pneumothorax in 05/2009.  She is on oxygen.  Past Medical History  Diagnosis Date  . Disc disease, degenerative, lumbar or lumbosacral   . COPD, severe ([place])   . HTN (hypertension)   . Hyperlipidemia   .  Gastric antral vascular ectasia   . Osteoporosis   . DVT (deep venous thrombosis) ([place]) 2005  . Asthma without status asthmaticus   . History of uterine cancer   . Fibrocystic breast disease     Past Surgical History  Procedure Laterality Date  . Appendectomy    . Cholecystectomy      Family History  Problem Relation Age of Onset  . Lung cancer Father     Social History:  reports that she quit smoking about 26 years ago. Her smoking use included Cigarettes. She quit after 25 years of use. She does not have any smokeless tobacco history on file. Her alcohol and drug histories are not on file.  Patient notes husband is "almost on dialysis".  The patient is alone today.  Allergies:  Allergies  Allergen Reactions  . Codeine   . Penicillins   . Sulfa Antibiotics Rash    Current Medications: Current Outpatient Prescriptions  Medication Sig Dispense Refill  . albuterol (PROAIR HFA) 108 (90 BASE) MCG/ACT inhaler Inhale into the lungs.    . ALPRAZolam (XANAX) 0.5 MG tablet     . clobetasol ointment (TEMOVATE) 0.05 % Apply topically.    Marland Kitchen esomeprazole (NEXIUM) 20 MG capsule Take by mouth.    . fluticasone (FLONASE) 50 MCG/ACT nasal spray Place into the nose.    Marland Kitchen HYDROcodone-acetaminophen (NORCO/VICODIN)  5-325 MG tablet Take by mouth.    . lovastatin (MEVACOR) 40 MG tablet Take by mouth.    . montelukast (SINGULAIR) 10 MG tablet Take by mouth.    . sertraline (ZOLOFT) 50 MG tablet Take by mouth.    . tiotropium (SPIRIVA) 18 MCG inhalation capsule Place into inhaler and inhale.    . traMADol (ULTRAM) 50 MG tablet Take by mouth.     No current facility-administered medications for this visit.   Facility-Administered Medications Ordered in Other Visits  Medication Dose Route Frequency Provider Last Rate Last Dose  . 0.9 %  sodium chloride infusion   Intravenous Continuous Lequita Asal, MD   Stopped at 05/01/15 1625    Review of Systems:  GENERAL:  No energy.  No fevers,  sweats or weight loss. PERFORMANCE STATUS (ECOG):  1-2 HEENT:  No visual changes, runny nose, sore throat, mouth sores or tenderness. Lungs: Chronic shortness of breath.  No cough.  No hemoptysis. Cardiac:  No chest pain, palpitations, orthopnea, or PND. GI:  No nausea, vomiting, diarrhea, constipation, melena or hematochezia. GU:  No urgency, frequency, dysuria, or hematuria. Musculoskeletal:  Osteoporosis.  Arthritis.  No back pain.  No muscle tenderness. Extremities:  No pain or swelling. Skin:  No rashes or skin changes. Neuro:  No headache, numbness or weakness, balance or coordination issues. Endocrine:  No diabetes, thyroid issues, hot flashes or night sweats. Psych:  No mood changes, depression or anxiety. Pain:  No focal pain. Review of systems:  All other systems reviewed and found to be negative.  Physical Exam: Blood pressure 153/72, pulse 81, temperature 98 F (36.7 C), temperature source Tympanic, resp. rate 18, height 5' (1.524 m), weight 138 lb 14.2 oz (63 kg), SpO2 94 %. GENERAL:  Well developed, well nourished, sitting comfortably in the exam room in no acute distress. MENTAL STATUS:  Alert and oriented to person, place and time. HEAD:  [place] styled hair.  Normocephalic, atraumatic, face symmetric, no Cushingoid features. EYES:  Red rimmed glasses.  Brown eyes.  Pupils equal round and reactive to light and accomodation.  No conjunctivitis or scleral icterus. ENT:  [place] in place.  Oropharynx clear without lesion.  Tongue normal. Mucous membranes moist.  RESPIRATORY:  Clear to auscultation without rales, wheezes or rhonchi. CARDIOVASCULAR:  Regular rate and rhythm without murmur, rub or gallop. ABDOMEN:  Wearing a back brace.  Soft, non-tender, with active bowel sounds, and no hepatosplenomegaly.  No masses. SKIN:  No rashes, ulcers or lesions. EXTREMITIES:  Thin arms and legs.  Arthritic changes.  No edema, no skin discoloration or tenderness.  No palpable cords. LYMPH  NODES: No palpable cervical, supraclavicular, axillary or inguinal adenopathy  NEUROLOGICAL: Unremarkable. PSYCH:  Appropriate.  Appointment on 05/01/2015  Component Date Value Ref Range Status  . WBC 05/01/2015 7.6  3.6 - 11.0 K/uL Final  . RBC 05/01/2015 3.86  3.80 - 5.20 MIL/uL Final  . Hemoglobin 05/01/2015 10.6* 12.0 - 16.0 g/dL Final  . HCT 05/01/2015 32.3* 35.0 - 47.0 % Final  . MCV 05/01/2015 83.6  80.0 - 100.0 fL Final  . MCH 05/01/2015 27.4  26.0 - 34.0 pg Final  . MCHC 05/01/2015 32.7  32.0 - 36.0 g/dL Final  . RDW 05/01/2015 15.9* 11.5 - 14.5 % Final  . Platelets 05/01/2015 242  150 - 440 K/uL Final  . Neutrophils Relative % 05/01/2015 37   Final  . Lymphocytes Relative 05/01/2015 24   Final  . Monocytes Relative 05/01/2015 32  Final  . Eosinophils Relative 05/01/2015 2   Final  . Basophils Relative 05/01/2015 2   Final  . Band Neutrophils 05/01/2015 3   Final  . Metamyelocytes Relative 05/01/2015 0   Final  . Myelocytes 05/01/2015 0   Final  . Promyelocytes Absolute 05/01/2015 0   Final  . Blasts 05/01/2015 0   Final  . nRBC 05/01/2015 0  0 /100 WBC Final  . Other 05/01/2015 0   Final  . Neutro Abs 05/01/2015 3.0  1.4 - 6.5 K/uL Final  . Lymphs Abs 05/01/2015 1.8  1.0 - 3.6 K/uL Final  . Monocytes Absolute 05/01/2015 2.4* 0.2 - 0.9 K/uL Final  . Eosinophils Absolute 05/01/2015 0.2  0 - 0.7 K/uL Final  . Basophils Absolute 05/01/2015 0.2* 0 - 0.1 K/uL Final  . Smear Review 05/01/2015 PLATELETS APPEAR ADEQUATE   Final   MORPHOLOGY UNREMARKABLE  . Ferritin 05/01/2015 60  11 - 307 ng/mL Final  . Iron 05/01/2015 42  28 - 170 ug/dL Final  . TIBC 05/01/2015 330  250 - 450 ug/dL Final  . Saturation Ratios 05/01/2015 13  10.4 - 31.8 % Final  . UIBC 05/01/2015 288   Final  . Retic Ct Pct 05/01/2015 1.0  0.4 - 3.1 % Final  . RBC. 05/01/2015 3.86  3.80 - 5.20 MIL/uL Final  . Retic Count, Manual 05/01/2015 38.6  19.0 - 183.0 K/uL Final    Assessment:  Margaret [name] is a  81 y.o. female  with iron deficiency anemia felt secondary to chronic blood loss from AV malformations.  In 2008, she required transfusion for a hemoglobin of 8.  Colonoscopy and barium enema by report were negative.  EGD revealed ectasias in stomach and duodenum.  She received a course of 1 gm of Venofer in 01/2014 an 02/2014.  She receives maintenance low dose Venofer 100 gm IV every 16 weeks (last 01/02/2015).   Labs on 01/02/2015 revealed a ferritin was 77.  Iron studies were normal with a saturation of 19% and a TIBC of 311.  B12 was 997 and folate was 7.1.Hematocrit was 32.3 with a hemoglobin of 10.6.  She previously received Procrit 20,000 units every week then every other week to maintain her hemoglobin.  Diet is good.  She has a history of recurrent right lower extremity deep venous thrombosis in 10/2003 and 01/2004.  She was on Coumadin.  Hypercoagulable work-up noted a mildly elevated anticardiolipin IgM of 32.   Symptomatically, she notes chronic shortness of breath due to her COPD.  She stopped smoking 17 years ago. She is on oxygen.  Plan: 1. Review entire medical history, diagnosis and management of iron deficiency anemia, and history of DVT. 2. Labs today:  CBC with diff, ferritin, iron studies, retic. 3. Venofer 100 mg IV today 4. RTC in 3 months for labs (CBC with diff, ferritin) +/- Venofer. 5. RTC in 6 months for MD assessment, labs (CBC with diff +/- others) +/- Venofer.  Addendum:  Review of CBCs reveals a persistent monocytosis since 2013 (? CMML).  This will be reviewed with patient at next follow-up regarding evaluation.   Lequita Asal, MD  05/01/2015, 4:56 PM

## 2015-05-01 NOTE — Progress Notes (Signed)
Patient is here for follow-up of IDA and venofer treatment. Patient states that her energy level has been very low. She states that if she does house work, she has to do a little at a time and then rest.

## 2015-05-02 ENCOUNTER — Telehealth: Payer: Self-pay

## 2015-05-02 NOTE — Telephone Encounter (Signed)
Called pt per Dr. Mike Gip to make sure she still wanted to change MD's.  No answer left message for pt to return my phone call.

## 2015-05-25 ENCOUNTER — Ambulatory Visit: Payer: Medicare Other | Admitting: Internal Medicine

## 2015-06-01 ENCOUNTER — Inpatient Hospital Stay: Payer: Medicare Other | Attending: Hematology and Oncology | Admitting: Internal Medicine

## 2015-06-01 ENCOUNTER — Encounter: Payer: Self-pay | Admitting: *Deleted

## 2015-06-01 ENCOUNTER — Inpatient Hospital Stay: Payer: Medicare Other | Attending: Internal Medicine | Admitting: Internal Medicine

## 2015-06-01 VITALS — BP 144/77 | HR 103 | Temp 97.6°F | Resp 18 | Ht 60.0 in | Wt 142.2 lb

## 2015-06-01 DIAGNOSIS — M810 Age-related osteoporosis without current pathological fracture: Secondary | ICD-10-CM | POA: Diagnosis not present

## 2015-06-01 DIAGNOSIS — J449 Chronic obstructive pulmonary disease, unspecified: Secondary | ICD-10-CM | POA: Insufficient documentation

## 2015-06-01 DIAGNOSIS — D509 Iron deficiency anemia, unspecified: Secondary | ICD-10-CM | POA: Diagnosis not present

## 2015-06-01 DIAGNOSIS — Z7901 Long term (current) use of anticoagulants: Secondary | ICD-10-CM | POA: Diagnosis not present

## 2015-06-01 DIAGNOSIS — N6019 Diffuse cystic mastopathy of unspecified breast: Secondary | ICD-10-CM | POA: Insufficient documentation

## 2015-06-01 DIAGNOSIS — M5137 Other intervertebral disc degeneration, lumbosacral region: Secondary | ICD-10-CM | POA: Diagnosis not present

## 2015-06-01 DIAGNOSIS — J45902 Unspecified asthma with status asthmaticus: Secondary | ICD-10-CM | POA: Insufficient documentation

## 2015-06-01 DIAGNOSIS — Z87891 Personal history of nicotine dependence: Secondary | ICD-10-CM | POA: Insufficient documentation

## 2015-06-01 DIAGNOSIS — Z79899 Other long term (current) drug therapy: Secondary | ICD-10-CM | POA: Diagnosis not present

## 2015-06-01 DIAGNOSIS — Z801 Family history of malignant neoplasm of trachea, bronchus and lung: Secondary | ICD-10-CM | POA: Diagnosis not present

## 2015-06-01 DIAGNOSIS — I10 Essential (primary) hypertension: Secondary | ICD-10-CM | POA: Diagnosis not present

## 2015-06-01 DIAGNOSIS — Z86718 Personal history of other venous thrombosis and embolism: Secondary | ICD-10-CM | POA: Diagnosis not present

## 2015-06-01 DIAGNOSIS — Z9981 Dependence on supplemental oxygen: Secondary | ICD-10-CM | POA: Diagnosis not present

## 2015-06-01 DIAGNOSIS — J45909 Unspecified asthma, uncomplicated: Secondary | ICD-10-CM | POA: Insufficient documentation

## 2015-06-01 DIAGNOSIS — Z8542 Personal history of malignant neoplasm of other parts of uterus: Secondary | ICD-10-CM | POA: Insufficient documentation

## 2015-06-01 NOTE — Progress Notes (Signed)
Patient is here for follow-up of IDA. She states that her energy level is still low.

## 2015-06-01 NOTE — Progress Notes (Signed)
[facility] OFFICE PROGRESS NOTE  Patient Care Team: Rusty Aus, MD as PCP - General (Internal Medicine)   SUMMARY OF ONCOLOGIC HISTORY:  # 2005 [atleast] ANEMIA- Iron def; ;?  Other reasons; W/Platelet- N; mild monocytosis [~2.5]; NO BMbx;Hx of Procrit in past;  IV iron q 3 M;   # Unprovoked DVT [2005] currently not on anticoagulation; COPD on 02 [place]  INTERVAL HISTORY:  This is my first interaction with the patient since I joined the practice September 2016. I reviewed the patient's prior charts/pertinent labs/imaging in detail; findings are summarized above.   81 year old female patient with above history of chronic anemia likely attributed to iron deficiency/AV malformation is here for follow-up. Patient last received IV iron in October 2016.  Patient denies any blood in stools black stools. Her appetite is fair. No weight loss. She has chronic fatigue- which seems to be getting worse. No abdominal pain constipation or diarrhea.   REVIEW OF SYSTEMS:  A complete 10 point review of system is done which is negative except mentioned above/history of present illness.   PAST MEDICAL HISTORY :  Past Medical History  Diagnosis Date  . Disc disease, degenerative, lumbar or lumbosacral   . COPD, severe ([place])   . HTN (hypertension)   . Hyperlipidemia   . Gastric antral vascular ectasia   . Osteoporosis   . DVT (deep venous thrombosis) ([place]) 2005  . Asthma without status asthmaticus   . History of uterine cancer   . Fibrocystic breast disease     PAST SURGICAL HISTORY :   Past Surgical History  Procedure Laterality Date  . Appendectomy    . Cholecystectomy      FAMILY HISTORY :   Family History  Problem Relation Age of Onset  . Lung cancer Father     SOCIAL HISTORY:   Social History  Substance Use Topics  . Smoking status: Former Smoker -- 25 years    Types: Cigarettes    Quit date: 12/05/1988  . Smokeless tobacco: Not on file  . Alcohol Use: Not on  file    ALLERGIES:  is allergic to codeine; penicillins; and sulfa antibiotics.  MEDICATIONS:  Current Outpatient Prescriptions  Medication Sig Dispense Refill  . albuterol (PROAIR HFA) 108 (90 BASE) MCG/ACT inhaler Inhale into the lungs.    . ALPRAZolam (XANAX) 0.5 MG tablet     . clobetasol ointment (TEMOVATE) 0.05 % Apply topically.    Marland Kitchen esomeprazole (NEXIUM) 20 MG capsule Take by mouth.    . fluticasone (FLONASE) 50 MCG/ACT nasal spray Place into the nose.    Marland Kitchen Fluticasone-Salmeterol (ADVAIR) 250-50 MCG/DOSE AEPB Inhale 1 puff into the lungs 2 (two) times daily.    Marland Kitchen HYDROcodone-acetaminophen (NORCO/VICODIN) 5-325 MG tablet Take by mouth.    . lovastatin (MEVACOR) 40 MG tablet Take by mouth.    . montelukast (SINGULAIR) 10 MG tablet Take by mouth.    . sertraline (ZOLOFT) 50 MG tablet Take by mouth.    . tiotropium (SPIRIVA) 18 MCG inhalation capsule Place into inhaler and inhale.    . traMADol (ULTRAM) 50 MG tablet Take by mouth.     No current facility-administered medications for this visit.    PHYSICAL EXAMINATION: ECOG PERFORMANCE STATUS: 1 - Symptomatic but completely ambulatory  BP 144/77 mmHg  Pulse 103  Temp(Src) 97.6 F (36.4 C) (Tympanic)  Resp 18  Ht 5' (1.524 m)  Wt 142 lb 3.2 oz (64.5 kg)  BMI 27.77 kg/m2  SpO2 93%  Filed Weights   06/01/15 1511  Weight: 142 lb 3.2 oz (64.5 kg)    GENERAL: Elderly Caucasian female patient; walking herself Alert, no distress and comfortable. Alone. 2 L nasal cannula. EYES: no pallor or icterus OROPHARYNX: no thrush or ulceration; dentures. NECK: supple, no masses felt LYMPH:  no palpable lymphadenopathy in the cervical, axillary or inguinal regions LUNGS: Decreased breath sounds bilaterally No wheeze or crackles HEART/CVS: regular rate & rhythm and no murmurs; No lower extremity edema ABDOMEN:abdomen soft, non-tender and normal bowel sounds Musculoskeletal:no cyanosis of digits and no clubbing  PSYCH: alert &  oriented x 3 with fluent speech NEURO: no focal motor/sensory deficits SKIN:  no rashes or significant lesions  LABORATORY DATA:  I have reviewed the data as listed    Component Value Date/Time   NA 139 12/19/2012 0850   K 4.0 12/19/2012 0850   CL 103 12/19/2012 0850   CO2 29 12/19/2012 0850   GLUCOSE 116* 12/19/2012 0850   BUN 16 12/19/2012 0850   CREATININE 1.16 12/19/2012 0850   CALCIUM 8.8 12/19/2012 0850   GFRNONAA 45* 12/19/2012 0850   GFRAA 52* 12/19/2012 0850    No results found for: SPEP, UPEP  Lab Results  Component Value Date   WBC 7.6 05/01/2015   NEUTROABS 3.0 05/01/2015   HGB 10.6* 05/01/2015   HCT 32.3* 05/01/2015   MCV 83.6 05/01/2015   PLT 242 05/01/2015      Chemistry      Component Value Date/Time   NA 139 12/19/2012 0850   K 4.0 12/19/2012 0850   CL 103 12/19/2012 0850   CO2 29 12/19/2012 0850   BUN 16 12/19/2012 0850   CREATININE 1.16 12/19/2012 0850      Component Value Date/Time   CALCIUM 8.8 12/19/2012 0850       RADIOGRAPHIC STUDIES: I have personally reviewed the radiological images as listed and agreed with the findings in the report. No results found.   ASSESSMENT & PLAN:  # Chronic anemia- at least from 2005 as per patient. Iron deficiency Anemia to be due to AV malformation.  patient has been receiving intermittent IV iron infusions most recently every 3 months. Patient's last IV iron infusion was in October 2016.  # Patient's most recent hemoglobin from October is 10.8/ferritin 75; I had  long discussion the patient regarding the possibility of some other cause of her anemia- as her hemoglobin has not corrected with IV iron infusion completely. Discussed about a bone marrow biopsy; discuss the procedure/potential complications including pain in detail. Understandably patient is not taking on having a bone marrow biopsy.  # For now plan CBC in 6 weeks; labs/M.D. Visit/possible IV iron in 3 months.  All questions were answered.  The patient knows to call the clinic with any problems, questions or concerns. No barriers to learning was detected.      Cammie Sickle, MD 06/01/2015 3:29 PM

## 2015-06-04 ENCOUNTER — Other Ambulatory Visit: Payer: Self-pay | Admitting: Internal Medicine

## 2015-06-04 ENCOUNTER — Telehealth: Payer: Self-pay | Admitting: *Deleted

## 2015-06-04 NOTE — Telephone Encounter (Signed)
Patient called to question why she was not scheduled for IV iron in 6 weeks.  I explained to the patient that Dr. Rogue Bussing would like to draw her labs in 6 weeks only. She will be notified at that time whether further IV iron is needed.

## 2015-06-18 ENCOUNTER — Ambulatory Visit: Payer: Medicare Other | Admitting: Internal Medicine

## 2015-07-13 ENCOUNTER — Inpatient Hospital Stay: Payer: Medicare Other | Attending: Internal Medicine

## 2015-07-13 DIAGNOSIS — D509 Iron deficiency anemia, unspecified: Secondary | ICD-10-CM | POA: Diagnosis present

## 2015-07-13 LAB — CBC WITH DIFFERENTIAL/PLATELET
BASOS PCT: 2 %
Band Neutrophils: 3 %
EOS PCT: 0 %
HCT: 32.2 % — ABNORMAL LOW (ref 35.0–47.0)
HEMOGLOBIN: 10.6 g/dL — AB (ref 12.0–16.0)
Lymphocytes Relative: 13 %
MCH: 27.8 pg (ref 26.0–34.0)
MCHC: 33.1 g/dL (ref 32.0–36.0)
MCV: 84 fL (ref 80.0–100.0)
MONOS PCT: 37 %
Neutrophils Relative %: 45 %
PLATELETS: 179 10*3/uL (ref 150–440)
RBC: 3.83 MIL/uL (ref 3.80–5.20)
RDW: 15.3 % — ABNORMAL HIGH (ref 11.5–14.5)
WBC: 6.8 10*3/uL (ref 3.6–11.0)

## 2015-07-13 LAB — RETICULOCYTES
RBC.: 3.83 MIL/uL (ref 3.80–5.20)
RETIC CT PCT: 0.9 % (ref 0.4–3.1)
Retic Count, Absolute: 34.5 10*3/uL (ref 19.0–183.0)

## 2015-07-13 LAB — C-REACTIVE PROTEIN: CRP: 1.2 mg/dL — AB (ref <1.0)

## 2015-07-13 LAB — LACTATE DEHYDROGENASE: LDH: 153 U/L (ref 98–192)

## 2015-07-13 LAB — FERRITIN: FERRITIN: 38 ng/mL (ref 11–307)

## 2015-07-15 LAB — HAPTOGLOBIN: Haptoglobin: 148 mg/dL (ref 34–200)

## 2015-07-15 LAB — KAPPA/LAMBDA LIGHT CHAINS
KAPPA, LAMDA LIGHT CHAIN RATIO: 1.35 (ref 0.26–1.65)
Kappa free light chain: 21.39 mg/L — ABNORMAL HIGH (ref 3.30–19.40)
LAMDA FREE LIGHT CHAINS: 15.86 mg/L (ref 5.71–26.30)

## 2015-07-15 LAB — ERYTHROPOIETIN: Erythropoietin: 29.5 m[IU]/mL — ABNORMAL HIGH (ref 2.6–18.5)

## 2015-07-17 LAB — MULTIPLE MYELOMA PANEL, SERUM
ALBUMIN SERPL ELPH-MCNC: 3.7 g/dL (ref 2.9–4.4)
ALPHA2 GLOB SERPL ELPH-MCNC: 0.8 g/dL (ref 0.4–1.0)
Albumin/Glob SerPl: 1.2 (ref 0.7–1.7)
Alpha 1: 0.3 g/dL (ref 0.0–0.4)
B-GLOBULIN SERPL ELPH-MCNC: 1 g/dL (ref 0.7–1.3)
Gamma Glob SerPl Elph-Mcnc: 1 g/dL (ref 0.4–1.8)
Globulin, Total: 3.1 g/dL (ref 2.2–3.9)
IGG (IMMUNOGLOBIN G), SERUM: 782 mg/dL (ref 700–1600)
IGM, SERUM: 150 mg/dL (ref 26–217)
IgA: 154 mg/dL (ref 64–422)
TOTAL PROTEIN ELP: 6.8 g/dL (ref 6.0–8.5)

## 2015-07-17 LAB — IMMUNOFIXATION ELECTROPHORESIS
IGG (IMMUNOGLOBIN G), SERUM: 859 mg/dL (ref 700–1600)
IGM, SERUM: 157 mg/dL (ref 26–217)
IgA: 160 mg/dL (ref 64–422)
TOTAL PROTEIN ELP: 6.8 g/dL (ref 6.0–8.5)

## 2015-08-01 ENCOUNTER — Ambulatory Visit: Payer: Medicare Other

## 2015-08-01 ENCOUNTER — Other Ambulatory Visit: Payer: Medicare Other

## 2015-08-13 ENCOUNTER — Encounter: Payer: Self-pay | Admitting: Hematology and Oncology

## 2015-08-15 ENCOUNTER — Other Ambulatory Visit: Payer: Self-pay | Admitting: Internal Medicine

## 2015-08-15 DIAGNOSIS — R911 Solitary pulmonary nodule: Secondary | ICD-10-CM

## 2015-08-17 ENCOUNTER — Ambulatory Visit
Admission: RE | Admit: 2015-08-17 | Discharge: 2015-08-17 | Disposition: A | Discharge status: Home / Self Care | Payer: Medicare Other | Source: Ambulatory Visit | Attending: Internal Medicine | Admitting: Internal Medicine

## 2015-08-17 DIAGNOSIS — R590 Localized enlarged lymph nodes: Secondary | ICD-10-CM | POA: Diagnosis not present

## 2015-08-17 DIAGNOSIS — M4856XA Collapsed vertebra, not elsewhere classified, lumbar region, initial encounter for fracture: Secondary | ICD-10-CM | POA: Diagnosis not present

## 2015-08-17 DIAGNOSIS — J439 Emphysema, unspecified: Secondary | ICD-10-CM | POA: Diagnosis not present

## 2015-08-17 DIAGNOSIS — R911 Solitary pulmonary nodule: Secondary | ICD-10-CM

## 2015-08-17 LAB — POCT I-STAT CREATININE: Creatinine, Ser: 1 mg/dL (ref 0.44–1.00)

## 2015-08-17 MED ORDER — IOHEXOL 300 MG/ML  SOLN
75.0000 mL | Freq: Once | INTRAMUSCULAR | Status: AC | PRN
  Administered 2015-08-17: 75 mL via INTRAVENOUS

## 2015-09-03 ENCOUNTER — Inpatient Hospital Stay: Payer: Medicare Other | Attending: Internal Medicine | Admitting: Internal Medicine

## 2015-09-03 ENCOUNTER — Inpatient Hospital Stay: Payer: Medicare Other

## 2015-09-03 VITALS — BP 132/68 | HR 99 | Temp 98.0°F | Resp 18 | Ht 60.0 in | Wt 141.9 lb

## 2015-09-03 DIAGNOSIS — M810 Age-related osteoporosis without current pathological fracture: Secondary | ICD-10-CM | POA: Insufficient documentation

## 2015-09-03 DIAGNOSIS — E785 Hyperlipidemia, unspecified: Secondary | ICD-10-CM | POA: Diagnosis not present

## 2015-09-03 DIAGNOSIS — I10 Essential (primary) hypertension: Secondary | ICD-10-CM | POA: Diagnosis not present

## 2015-09-03 DIAGNOSIS — Q2730 Arteriovenous malformation, site unspecified: Secondary | ICD-10-CM | POA: Insufficient documentation

## 2015-09-03 DIAGNOSIS — Z79899 Other long term (current) drug therapy: Secondary | ICD-10-CM | POA: Insufficient documentation

## 2015-09-03 DIAGNOSIS — M5136 Other intervertebral disc degeneration, lumbar region: Secondary | ICD-10-CM

## 2015-09-03 DIAGNOSIS — D509 Iron deficiency anemia, unspecified: Secondary | ICD-10-CM

## 2015-09-03 DIAGNOSIS — J449 Chronic obstructive pulmonary disease, unspecified: Secondary | ICD-10-CM

## 2015-09-03 DIAGNOSIS — Z8542 Personal history of malignant neoplasm of other parts of uterus: Secondary | ICD-10-CM | POA: Diagnosis not present

## 2015-09-03 DIAGNOSIS — Z87891 Personal history of nicotine dependence: Secondary | ICD-10-CM

## 2015-09-03 DIAGNOSIS — R5382 Chronic fatigue, unspecified: Secondary | ICD-10-CM | POA: Diagnosis not present

## 2015-09-03 DIAGNOSIS — Z86718 Personal history of other venous thrombosis and embolism: Secondary | ICD-10-CM | POA: Insufficient documentation

## 2015-09-03 LAB — CBC WITH DIFFERENTIAL/PLATELET
BASOS PCT: 2 %
Band Neutrophils: 1 %
Eosinophils Relative: 0 %
HEMATOCRIT: 32.9 % — AB (ref 35.0–47.0)
HEMOGLOBIN: 10.7 g/dL — AB (ref 12.0–16.0)
Lymphocytes Relative: 14 %
MCH: 27.7 pg (ref 26.0–34.0)
MCHC: 32.7 g/dL (ref 32.0–36.0)
MCV: 84.6 fL (ref 80.0–100.0)
Monocytes Relative: 33 %
Neutrophils Relative %: 50 %
Platelets: 199 10*3/uL (ref 150–440)
RBC: 3.88 MIL/uL (ref 3.80–5.20)
RDW: 16.1 % — ABNORMAL HIGH (ref 11.5–14.5)
WBC: 13.3 10*3/uL — ABNORMAL HIGH (ref 3.6–11.0)

## 2015-09-03 LAB — COMPREHENSIVE METABOLIC PANEL
ALBUMIN: 4.4 g/dL (ref 3.5–5.0)
ALK PHOS: 75 U/L (ref 38–126)
ALT: 15 U/L (ref 14–54)
ANION GAP: 5 (ref 5–15)
AST: 20 U/L (ref 15–41)
BILIRUBIN TOTAL: 0.4 mg/dL (ref 0.3–1.2)
BUN: 21 mg/dL — AB (ref 6–20)
CALCIUM: 8.9 mg/dL (ref 8.9–10.3)
CO2: 28 mmol/L (ref 22–32)
Chloride: 104 mmol/L (ref 101–111)
Creatinine, Ser: 1.16 mg/dL — ABNORMAL HIGH (ref 0.44–1.00)
GFR calc Af Amer: 49 mL/min — ABNORMAL LOW (ref >60)
GFR calc non Af Amer: 43 mL/min — ABNORMAL LOW (ref >60)
GLUCOSE: 158 mg/dL — AB (ref 65–99)
Potassium: 3.6 mmol/L (ref 3.5–5.1)
SODIUM: 137 mmol/L (ref 135–145)
TOTAL PROTEIN: 7.4 g/dL (ref 6.5–8.1)

## 2015-09-03 MED ORDER — SODIUM CHLORIDE 0.9 % IV SOLN
200.0000 mg | Freq: Once | INTRAVENOUS | Status: AC
  Administered 2015-09-03: 200 mg via INTRAVENOUS
  Filled 2015-09-03: qty 10

## 2015-09-03 MED ORDER — SODIUM CHLORIDE 0.9 % IV SOLN
INTRAVENOUS | Status: DC
  Administered 2015-09-03: 15:00:00 via INTRAVENOUS
  Filled 2015-09-03: qty 1000

## 2015-09-03 NOTE — Progress Notes (Signed)
[facility] OFFICE PROGRESS NOTE  Patient Care Team: Rusty Aus, MD as PCP - General (Internal Medicine)   SUMMARY OF ONCOLOGIC HISTORY:  # 2005 [atleast] ANEMIA- Iron def; ;?  Other reasons; W/Platelet- N; mild monocytosis [~2.5]; NO BMbx;Hx of Procrit in past;  IV iron q 3 M;   # Unprovoked DVT [2005] currently not on anticoagulation; COPD on 02 [place]  INTERVAL HISTORY:   82 year-old female patient with above history chronic anemia likely attributed to iron deficiency/AV malformation is here for follow-up. Patient last received IV iron in October 2016.  Patient interviewed had a CT scan of the chest- negative for any lung nodules.  Patient continues to complain of chronic fatigue; however denies any blood in stools black stools. Her appetite is fair. No weight loss.   REVIEW OF SYSTEMS:  A complete 10 point review of system is done which is negative except mentioned above/history of present illness.   PAST MEDICAL HISTORY :  Past Medical History  Diagnosis Date  . Disc disease, degenerative, lumbar or lumbosacral   . COPD, severe ([place])   . HTN (hypertension)   . Hyperlipidemia   . Gastric antral vascular ectasia   . Osteoporosis   . DVT (deep venous thrombosis) ([place]) 2005  . Asthma without status asthmaticus   . Fibrocystic breast disease   . History of uterine cancer     PAST SURGICAL HISTORY :   Past Surgical History  Procedure Laterality Date  . Appendectomy    . Cholecystectomy      FAMILY HISTORY :   Family History  Problem Relation Age of Onset  . Lung cancer Father     SOCIAL HISTORY:   Social History  Substance Use Topics  . Smoking status: Former Smoker -- 25 years    Types: Cigarettes    Quit date: 12/05/1988  . Smokeless tobacco: Not on file  . Alcohol Use: Not on file    ALLERGIES:  is allergic to codeine; penicillins; and sulfa antibiotics.  MEDICATIONS:  Current Outpatient Prescriptions  Medication Sig Dispense Refill  .  albuterol (PROAIR HFA) 108 (90 BASE) MCG/ACT inhaler Inhale into the lungs.    . ALPRAZolam (XANAX) 0.5 MG tablet     . clobetasol ointment (TEMOVATE) 0.05 % Apply topically.    Marland Kitchen esomeprazole (NEXIUM) 20 MG capsule Take by mouth.    . fluticasone (FLONASE) 50 MCG/ACT nasal spray Place into the nose.    Marland Kitchen Fluticasone-Salmeterol (ADVAIR) 250-50 MCG/DOSE AEPB Inhale 1 puff into the lungs 2 (two) times daily.    Marland Kitchen HYDROcodone-acetaminophen (NORCO/VICODIN) 5-325 MG tablet Take by mouth.    . lovastatin (MEVACOR) 40 MG tablet Take by mouth.    . montelukast (SINGULAIR) 10 MG tablet Take by mouth.    . sertraline (ZOLOFT) 50 MG tablet Take by mouth.    . tiotropium (SPIRIVA) 18 MCG inhalation capsule Place into inhaler and inhale.    . traMADol (ULTRAM) 50 MG tablet Take by mouth.     No current facility-administered medications for this visit.    PHYSICAL EXAMINATION: ECOG PERFORMANCE STATUS: 1 - Symptomatic but completely ambulatory  BP 132/68 mmHg  Pulse 99  Temp(Src) 98 F (36.7 C) (Tympanic)  Resp 18  Ht 5' (1.524 m)  Wt 141 lb 13.9 oz (64.35 kg)  BMI 27.71 kg/m2  SpO2 93%  Filed Weights   09/03/15 1420  Weight: 141 lb 13.9 oz (64.35 kg)    GENERAL: Elderly Caucasian female patient; walking  herself Alert, no distress and comfortable. Alone. 2 L nasal cannula. EYES: no pallor or icterus OROPHARYNX: no thrush or ulceration; dentures. NECK: supple, no masses felt LYMPH:  no palpable lymphadenopathy in the cervical, axillary or inguinal regions LUNGS: Decreased breath sounds bilaterally No wheeze or crackles HEART/CVS: regular rate & rhythm and no murmurs; No lower extremity edema ABDOMEN:abdomen soft, non-tender and normal bowel sounds Musculoskeletal:no cyanosis of digits and no clubbing; she wears a back brace. PSYCH: alert & oriented x 3 with fluent speech NEURO: no focal motor/sensory deficits SKIN:  no rashes or significant lesions  LABORATORY DATA:  I have reviewed  the data as listed    Component Value Date/Time   NA 137 09/03/2015 1351   NA 139 12/19/2012 0850   K 3.6 09/03/2015 1351   K 4.0 12/19/2012 0850   CL 104 09/03/2015 1351   CL 103 12/19/2012 0850   CO2 28 09/03/2015 1351   CO2 29 12/19/2012 0850   GLUCOSE 158* 09/03/2015 1351   GLUCOSE 116* 12/19/2012 0850   BUN 21* 09/03/2015 1351   BUN 16 12/19/2012 0850   CREATININE 1.16* 09/03/2015 1351   CREATININE 1.16 12/19/2012 0850   CALCIUM 8.9 09/03/2015 1351   CALCIUM 8.8 12/19/2012 0850   PROT 7.4 09/03/2015 1351   ALBUMIN 4.4 09/03/2015 1351   AST 20 09/03/2015 1351   ALT 15 09/03/2015 1351   ALKPHOS 75 09/03/2015 1351   BILITOT 0.4 09/03/2015 1351   GFRNONAA 43* 09/03/2015 1351   GFRNONAA 45* 12/19/2012 0850   GFRAA 49* 09/03/2015 1351   GFRAA 52* 12/19/2012 0850    No results found for: SPEP, UPEP  Lab Results  Component Value Date   WBC 13.3* 09/03/2015   NEUTROABS PENDING 09/03/2015   HGB 10.7* 09/03/2015   HCT 32.9* 09/03/2015   MCV 84.6 09/03/2015   PLT 199 09/03/2015      Chemistry      Component Value Date/Time   NA 137 09/03/2015 1351   NA 139 12/19/2012 0850   K 3.6 09/03/2015 1351   K 4.0 12/19/2012 0850   CL 104 09/03/2015 1351   CL 103 12/19/2012 0850   CO2 28 09/03/2015 1351   CO2 29 12/19/2012 0850   BUN 21* 09/03/2015 1351   BUN 16 12/19/2012 0850   CREATININE 1.16* 09/03/2015 1351   CREATININE 1.16 12/19/2012 0850      Component Value Date/Time   CALCIUM 8.9 09/03/2015 1351   CALCIUM 8.8 12/19/2012 0850   ALKPHOS 75 09/03/2015 1351   AST 20 09/03/2015 1351   ALT 15 09/03/2015 1351   BILITOT 0.4 09/03/2015 1351       RADIOGRAPHIC STUDIES: I have personally reviewed the radiological images as listed and agreed with the findings in the report. No results found.   ASSESSMENT & PLAN:  # Chronic anemia- at least from 2005 as per patient. Iron deficiency Anemia to be due to AV malformation.  patient has been receiving intermittent IV  iron infusions most recently every 3 months. Patient's last IV iron infusion was in October 2016.  # on Dec 30thf December 2016 ferritin was 38/ hemoglobin today is 10.7. Recommend IV Venofer 200 mg.  # Recommend labs in 1 week prior to next visit-CBC BMP/iron studies/ ferritin; also recommend soluble transferrin receptor.   # follow-up in 3 months with me/possible Venofer at that time.    Cammie Sickle, MD 09/03/2015 2:40 PM

## 2015-10-29 ENCOUNTER — Ambulatory Visit: Payer: Medicare Other | Admitting: Internal Medicine

## 2015-10-30 ENCOUNTER — Ambulatory Visit: Payer: Medicare Other | Admitting: Hematology and Oncology

## 2015-11-28 ENCOUNTER — Other Ambulatory Visit: Payer: Medicare Other

## 2015-11-30 ENCOUNTER — Ambulatory Visit: Payer: Medicare Other | Admitting: Internal Medicine

## 2015-11-30 ENCOUNTER — Ambulatory Visit: Payer: Medicare Other

## 2015-12-03 ENCOUNTER — Inpatient Hospital Stay: Payer: Medicare Other | Attending: Internal Medicine

## 2015-12-03 DIAGNOSIS — Z79899 Other long term (current) drug therapy: Secondary | ICD-10-CM | POA: Diagnosis not present

## 2015-12-03 DIAGNOSIS — R5383 Other fatigue: Secondary | ICD-10-CM | POA: Diagnosis not present

## 2015-12-03 DIAGNOSIS — Z86718 Personal history of other venous thrombosis and embolism: Secondary | ICD-10-CM | POA: Insufficient documentation

## 2015-12-03 DIAGNOSIS — Q2730 Arteriovenous malformation, site unspecified: Secondary | ICD-10-CM | POA: Diagnosis not present

## 2015-12-03 DIAGNOSIS — Z8542 Personal history of malignant neoplasm of other parts of uterus: Secondary | ICD-10-CM | POA: Insufficient documentation

## 2015-12-03 DIAGNOSIS — M810 Age-related osteoporosis without current pathological fracture: Secondary | ICD-10-CM | POA: Diagnosis not present

## 2015-12-03 DIAGNOSIS — D509 Iron deficiency anemia, unspecified: Secondary | ICD-10-CM

## 2015-12-03 DIAGNOSIS — E785 Hyperlipidemia, unspecified: Secondary | ICD-10-CM | POA: Diagnosis not present

## 2015-12-03 DIAGNOSIS — I10 Essential (primary) hypertension: Secondary | ICD-10-CM | POA: Diagnosis not present

## 2015-12-03 DIAGNOSIS — Z87891 Personal history of nicotine dependence: Secondary | ICD-10-CM | POA: Diagnosis not present

## 2015-12-03 DIAGNOSIS — J449 Chronic obstructive pulmonary disease, unspecified: Secondary | ICD-10-CM | POA: Diagnosis not present

## 2015-12-03 LAB — IRON AND TIBC
Iron: 30 ug/dL (ref 28–170)
Saturation Ratios: 9 % — ABNORMAL LOW (ref 10.4–31.8)
TIBC: 328 ug/dL (ref 250–450)
UIBC: 298 ug/dL

## 2015-12-03 LAB — CBC WITH DIFFERENTIAL/PLATELET
BASOS ABS: 0.1 10*3/uL (ref 0–0.1)
BASOS PCT: 1 %
Band Neutrophils: 1 %
Eosinophils Absolute: 0.1 10*3/uL (ref 0–0.7)
Eosinophils Relative: 1 %
HEMATOCRIT: 27.9 % — AB (ref 35.0–47.0)
Hemoglobin: 9.4 g/dL — ABNORMAL LOW (ref 12.0–16.0)
LYMPHS ABS: 1.5 10*3/uL (ref 1.0–3.6)
Lymphocytes Relative: 23 %
MCH: 28.5 pg (ref 26.0–34.0)
MCHC: 33.8 g/dL (ref 32.0–36.0)
MCV: 84.2 fL (ref 80.0–100.0)
MONO ABS: 1.2 10*3/uL — AB (ref 0.2–0.9)
MONOS PCT: 18 %
Metamyelocytes Relative: 1 %
Myelocytes: 2 %
Neutro Abs: 3.7 10*3/uL (ref 1.4–6.5)
Neutrophils Relative %: 53 %
PLATELETS: 197 10*3/uL (ref 150–440)
RBC: 3.31 MIL/uL — ABNORMAL LOW (ref 3.80–5.20)
RDW: 17.8 % — AB (ref 11.5–14.5)
WBC: 6.6 10*3/uL (ref 3.6–11.0)

## 2015-12-03 LAB — BASIC METABOLIC PANEL
ANION GAP: 5 (ref 5–15)
BUN: 15 mg/dL (ref 6–20)
CALCIUM: 9 mg/dL (ref 8.9–10.3)
CO2: 32 mmol/L (ref 22–32)
CREATININE: 0.88 mg/dL (ref 0.44–1.00)
Chloride: 101 mmol/L (ref 101–111)
GFR calc Af Amer: >60 mL/min (ref >60)
GFR, EST NON AFRICAN AMERICAN: 60 mL/min — AB (ref >60)
GLUCOSE: 132 mg/dL — AB (ref 65–99)
Potassium: 3.7 mmol/L (ref 3.5–5.1)
Sodium: 138 mmol/L (ref 135–145)

## 2015-12-03 LAB — FERRITIN: Ferritin: 28 ng/mL (ref 11–307)

## 2015-12-04 ENCOUNTER — Other Ambulatory Visit: Payer: Self-pay | Admitting: Internal Medicine

## 2015-12-04 LAB — SOLUBLE TRANSFERRIN RECEPTOR: Transferrin Receptor: 23.1 nmol/L (ref 12.2–27.3)

## 2015-12-05 ENCOUNTER — Inpatient Hospital Stay (HOSPITAL_BASED_OUTPATIENT_CLINIC_OR_DEPARTMENT_OTHER): Payer: Medicare Other | Admitting: Internal Medicine

## 2015-12-05 ENCOUNTER — Inpatient Hospital Stay: Payer: Medicare Other

## 2015-12-05 VITALS — BP 126/72 | HR 98 | Temp 97.6°F | Resp 22 | Wt 138.2 lb

## 2015-12-05 DIAGNOSIS — Q2730 Arteriovenous malformation, site unspecified: Secondary | ICD-10-CM | POA: Diagnosis not present

## 2015-12-05 DIAGNOSIS — Z86718 Personal history of other venous thrombosis and embolism: Secondary | ICD-10-CM

## 2015-12-05 DIAGNOSIS — Z87891 Personal history of nicotine dependence: Secondary | ICD-10-CM

## 2015-12-05 DIAGNOSIS — Z8542 Personal history of malignant neoplasm of other parts of uterus: Secondary | ICD-10-CM

## 2015-12-05 DIAGNOSIS — J449 Chronic obstructive pulmonary disease, unspecified: Secondary | ICD-10-CM

## 2015-12-05 DIAGNOSIS — R5383 Other fatigue: Secondary | ICD-10-CM

## 2015-12-05 DIAGNOSIS — E785 Hyperlipidemia, unspecified: Secondary | ICD-10-CM

## 2015-12-05 DIAGNOSIS — D509 Iron deficiency anemia, unspecified: Secondary | ICD-10-CM

## 2015-12-05 DIAGNOSIS — Z79899 Other long term (current) drug therapy: Secondary | ICD-10-CM

## 2015-12-05 DIAGNOSIS — M810 Age-related osteoporosis without current pathological fracture: Secondary | ICD-10-CM

## 2015-12-05 DIAGNOSIS — I10 Essential (primary) hypertension: Secondary | ICD-10-CM

## 2015-12-05 MED ORDER — SODIUM CHLORIDE 0.9 % IV SOLN
Freq: Once | INTRAVENOUS | Status: AC
  Administered 2015-12-05: 15:00:00 via INTRAVENOUS
  Filled 2015-12-05: qty 1000

## 2015-12-05 MED ORDER — SODIUM CHLORIDE 0.9 % IV SOLN
200.0000 mg | Freq: Once | INTRAVENOUS | Status: AC
  Administered 2015-12-05: 200 mg via INTRAVENOUS
  Filled 2015-12-05: qty 10

## 2015-12-05 NOTE — Progress Notes (Signed)
Patient ambulates without assistance, brought to exam room 5.  Patient O2 91% on 6L nasal cannula.  Patient denies pain or discomfort, vitals documented.  Medication record updated, information provided by patient.

## 2015-12-05 NOTE — Progress Notes (Signed)
[facility] OFFICE PROGRESS NOTE  Patient Care Team: Rusty Aus, MD as PCP - General (Internal Medicine)   SUMMARY OF ONCOLOGIC HISTORY:  # 2005 [atleast] ANEMIA- Iron def; ;?  Other reasons; W/Platelet- N; mild monocytosis [~2.5]; NO BMbx;Hx of Procrit in past;  IV Venofer q 3 M;   # Unprovoked DVT [2005] currently not on anticoagulation; COPD on 0 2 [place]  INTERVAL HISTORY:   82 year-old female patient with above history chronic anemia likely attributed to iron deficiency/AV malformation is here for follow-up. Patient last received IV iron in Feb 2017.   Patient continues to complain of chronic fatigue. Continues to deny any blood in stools or black stools. Her appetite is fair. No weight loss.   REVIEW OF SYSTEMS:  A complete 10 point review of system is done which is negative except mentioned above/history of present illness.   PAST MEDICAL HISTORY :  Past Medical History  Diagnosis Date  . Disc disease, degenerative, lumbar or lumbosacral   . COPD, severe ([place])   . HTN (hypertension)   . Hyperlipidemia   . Gastric antral vascular ectasia   . Osteoporosis   . DVT (deep venous thrombosis) ([place]) 2005  . Asthma without status asthmaticus   . Fibrocystic breast disease   . History of uterine cancer     PAST SURGICAL HISTORY :   Past Surgical History  Procedure Laterality Date  . Appendectomy    . Cholecystectomy      FAMILY HISTORY :   Family History  Problem Relation Age of Onset  . Lung cancer Father     SOCIAL HISTORY:   Social History  Substance Use Topics  . Smoking status: Former Smoker -- 25 years    Types: Cigarettes    Quit date: 12/05/1988  . Smokeless tobacco: Not on file  . Alcohol Use: Not on file    ALLERGIES:  is allergic to codeine; penicillins; and sulfa antibiotics.  MEDICATIONS:  Current Outpatient Prescriptions  Medication Sig Dispense Refill  . albuterol (PROAIR HFA) 108 (90 BASE) MCG/ACT inhaler Inhale into the lungs.     . ALPRAZolam (XANAX) 0.5 MG tablet     . clobetasol ointment (TEMOVATE) 0.05 % Apply topically.    Marland Kitchen esomeprazole (NEXIUM) 20 MG capsule Take by mouth.    Marland Kitchen HYDROcodone-acetaminophen (NORCO/VICODIN) 5-325 MG tablet Take by mouth.    . lovastatin (MEVACOR) 40 MG tablet Take by mouth.    . montelukast (SINGULAIR) 10 MG tablet Take by mouth.    . sertraline (ZOLOFT) 50 MG tablet Take by mouth.    . tiotropium (SPIRIVA) 18 MCG inhalation capsule Place into inhaler and inhale.    . traMADol (ULTRAM) 50 MG tablet Take by mouth.    . fluticasone (FLONASE) 50 MCG/ACT nasal spray Place into the nose. Reported on 12/05/2015     No current facility-administered medications for this visit.    PHYSICAL EXAMINATION: ECOG PERFORMANCE STATUS: 1 - Symptomatic but completely ambulatory  BP 126/72 mmHg  Pulse 98  Temp(Src) 97.6 F (36.4 C) (Tympanic)  Resp 22  Wt 138 lb 3.7 oz (62.7 kg)  SpO2 91%  Filed Weights   12/05/15 1445  Weight: 138 lb 3.7 oz (62.7 kg)    GENERAL: Elderly Caucasian female patient; walking herself Alert, no distress and comfortable. Alone. 2 L nasal cannula. EYES: no pallor or icterus OROPHARYNX: no thrush or ulceration; dentures. NECK: supple, no masses felt LYMPH:  no palpable lymphadenopathy in the cervical, axillary  or inguinal regions LUNGS: Decreased breath sounds bilaterally No wheeze or crackles HEART/CVS: regular rate & rhythm and no murmurs; No lower extremity edema ABDOMEN:abdomen soft, non-tender and normal bowel sounds Musculoskeletal:no cyanosis of digits and no clubbing; she wears a back brace. PSYCH: alert & oriented x 3 with fluent speech NEURO: no focal motor/sensory deficits SKIN:  no rashes or significant lesions  LABORATORY DATA:  I have reviewed the data as listed    Component Value Date/Time   NA 138 12/03/2015 0848   NA 139 12/19/2012 0850   K 3.7 12/03/2015 0848   K 4.0 12/19/2012 0850   CL 101 12/03/2015 0848   CL 103 12/19/2012  0850   CO2 32 12/03/2015 0848   CO2 29 12/19/2012 0850   GLUCOSE 132* 12/03/2015 0848   GLUCOSE 116* 12/19/2012 0850   BUN 15 12/03/2015 0848   BUN 16 12/19/2012 0850   CREATININE 0.88 12/03/2015 0848   CREATININE 1.16 12/19/2012 0850   CALCIUM 9.0 12/03/2015 0848   CALCIUM 8.8 12/19/2012 0850   PROT 7.4 09/03/2015 1351   ALBUMIN 4.4 09/03/2015 1351   AST 20 09/03/2015 1351   ALT 15 09/03/2015 1351   ALKPHOS 75 09/03/2015 1351   BILITOT 0.4 09/03/2015 1351   GFRNONAA 60* 12/03/2015 0848   GFRNONAA 45* 12/19/2012 0850   GFRAA >60 12/03/2015 0848   GFRAA 52* 12/19/2012 0850    No results found for: SPEP, UPEP  Lab Results  Component Value Date   WBC 6.6 12/03/2015   NEUTROABS 3.7 12/03/2015   HGB 9.4* 12/03/2015   HCT 27.9* 12/03/2015   MCV 84.2 12/03/2015   PLT 197 12/03/2015      Chemistry      Component Value Date/Time   NA 138 12/03/2015 0848   NA 139 12/19/2012 0850   K 3.7 12/03/2015 0848   K 4.0 12/19/2012 0850   CL 101 12/03/2015 0848   CL 103 12/19/2012 0850   CO2 32 12/03/2015 0848   CO2 29 12/19/2012 0850   BUN 15 12/03/2015 0848   BUN 16 12/19/2012 0850   CREATININE 0.88 12/03/2015 0848   CREATININE 1.16 12/19/2012 0850      Component Value Date/Time   CALCIUM 9.0 12/03/2015 0848   CALCIUM 8.8 12/19/2012 0850   ALKPHOS 75 09/03/2015 1351   AST 20 09/03/2015 1351   ALT 15 09/03/2015 1351   BILITOT 0.4 09/03/2015 1351       ASSESSMENT & PLAN:  # Chronic anemia- at least from 2005 as per patient. Iron deficiency Anemia to be due to AV malformation.  Patient has been receiving intermittent IV iron infusions most recently every 3 months. Patient's last IV iron infusion was in February 2017. Hemoglobin is 9.7. Ferritin-28. Continue IV Venofer every 3 months. Also recommended PO Iron once a day.    # Patient has financial issues/continue Venofer at this time.  # IV Venofer labs in 3 months; follow-up with me in 6 months with labs IV Venofer.      Cammie Sickle, MD 12/05/2015 3:05 PM

## 2016-01-15 ENCOUNTER — Other Ambulatory Visit: Payer: Self-pay | Admitting: Nurse Practitioner

## 2016-03-11 ENCOUNTER — Inpatient Hospital Stay: Payer: Medicare Other | Attending: Internal Medicine

## 2016-03-11 ENCOUNTER — Inpatient Hospital Stay: Payer: Medicare Other

## 2016-03-11 VITALS — BP 144/81 | HR 68 | Temp 97.0°F | Resp 20

## 2016-03-11 DIAGNOSIS — Q2730 Arteriovenous malformation, site unspecified: Secondary | ICD-10-CM | POA: Insufficient documentation

## 2016-03-11 DIAGNOSIS — D509 Iron deficiency anemia, unspecified: Secondary | ICD-10-CM | POA: Diagnosis present

## 2016-03-11 LAB — CBC WITH DIFFERENTIAL/PLATELET
BAND NEUTROPHILS: 2 %
BASOS ABS: 0 10*3/uL (ref 0–0.1)
BASOS PCT: 0 %
EOS ABS: 0 10*3/uL (ref 0–0.7)
EOS PCT: 0 %
HCT: 28.5 % — ABNORMAL LOW (ref 35.0–47.0)
Hemoglobin: 9.4 g/dL — ABNORMAL LOW (ref 12.0–16.0)
LYMPHS PCT: 25 %
Lymphs Abs: 1.9 10*3/uL (ref 1.0–3.6)
MCH: 28 pg (ref 26.0–34.0)
MCHC: 33.1 g/dL (ref 32.0–36.0)
MCV: 84.5 fL (ref 80.0–100.0)
Monocytes Absolute: 2.5 10*3/uL — ABNORMAL HIGH (ref 0.2–0.9)
Monocytes Relative: 33 %
NEUTROS PCT: 40 %
Neutro Abs: 3.1 10*3/uL (ref 1.4–6.5)
PLATELETS: 206 10*3/uL (ref 150–440)
RBC: 3.38 MIL/uL — AB (ref 3.80–5.20)
RDW: 16.4 % — ABNORMAL HIGH (ref 11.5–14.5)
WBC: 7.5 10*3/uL (ref 3.6–11.0)

## 2016-03-11 LAB — IRON AND TIBC
IRON: 46 ug/dL (ref 28–170)
SATURATION RATIOS: 12 % (ref 10.4–31.8)
TIBC: 378 ug/dL (ref 250–450)
UIBC: 332 ug/dL

## 2016-03-11 LAB — BASIC METABOLIC PANEL
ANION GAP: 7 (ref 5–15)
BUN: 18 mg/dL (ref 6–20)
CO2: 28 mmol/L (ref 22–32)
Calcium: 8.9 mg/dL (ref 8.9–10.3)
Chloride: 104 mmol/L (ref 101–111)
Creatinine, Ser: 1.26 mg/dL — ABNORMAL HIGH (ref 0.44–1.00)
GFR, EST AFRICAN AMERICAN: 44 mL/min — AB (ref >60)
GFR, EST NON AFRICAN AMERICAN: 38 mL/min — AB (ref >60)
Glucose, Bld: 136 mg/dL — ABNORMAL HIGH (ref 65–99)
POTASSIUM: 3.5 mmol/L (ref 3.5–5.1)
SODIUM: 139 mmol/L (ref 135–145)

## 2016-03-11 LAB — FERRITIN: FERRITIN: 14 ng/mL (ref 11–307)

## 2016-03-11 MED ORDER — SODIUM CHLORIDE 0.9 % IV SOLN
200.0000 mg | Freq: Once | INTRAVENOUS | Status: AC
  Administered 2016-03-11: 200 mg via INTRAVENOUS
  Filled 2016-03-11: qty 10

## 2016-03-31 ENCOUNTER — Other Ambulatory Visit: Payer: Self-pay | Admitting: Internal Medicine

## 2016-06-06 ENCOUNTER — Other Ambulatory Visit: Payer: Medicare Other

## 2016-06-10 ENCOUNTER — Inpatient Hospital Stay: Payer: Medicare Other

## 2016-06-10 DIAGNOSIS — Z9981 Dependence on supplemental oxygen: Secondary | ICD-10-CM | POA: Diagnosis not present

## 2016-06-10 DIAGNOSIS — E785 Hyperlipidemia, unspecified: Secondary | ICD-10-CM | POA: Insufficient documentation

## 2016-06-10 DIAGNOSIS — Z86718 Personal history of other venous thrombosis and embolism: Secondary | ICD-10-CM | POA: Diagnosis not present

## 2016-06-10 DIAGNOSIS — D500 Iron deficiency anemia secondary to blood loss (chronic): Secondary | ICD-10-CM | POA: Insufficient documentation

## 2016-06-10 DIAGNOSIS — J449 Chronic obstructive pulmonary disease, unspecified: Secondary | ICD-10-CM | POA: Insufficient documentation

## 2016-06-10 DIAGNOSIS — Z8542 Personal history of malignant neoplasm of other parts of uterus: Secondary | ICD-10-CM | POA: Diagnosis not present

## 2016-06-10 DIAGNOSIS — I10 Essential (primary) hypertension: Secondary | ICD-10-CM | POA: Insufficient documentation

## 2016-06-10 DIAGNOSIS — R5382 Chronic fatigue, unspecified: Secondary | ICD-10-CM | POA: Insufficient documentation

## 2016-06-10 DIAGNOSIS — Z599 Problem related to housing and economic circumstances, unspecified: Secondary | ICD-10-CM | POA: Insufficient documentation

## 2016-06-10 DIAGNOSIS — M810 Age-related osteoporosis without current pathological fracture: Secondary | ICD-10-CM | POA: Diagnosis not present

## 2016-06-10 DIAGNOSIS — D509 Iron deficiency anemia, unspecified: Secondary | ICD-10-CM

## 2016-06-10 DIAGNOSIS — Q2730 Arteriovenous malformation, site unspecified: Secondary | ICD-10-CM | POA: Diagnosis not present

## 2016-06-10 DIAGNOSIS — Z87891 Personal history of nicotine dependence: Secondary | ICD-10-CM | POA: Diagnosis not present

## 2016-06-10 DIAGNOSIS — Z79899 Other long term (current) drug therapy: Secondary | ICD-10-CM | POA: Insufficient documentation

## 2016-06-10 LAB — BASIC METABOLIC PANEL
ANION GAP: 6 (ref 5–15)
BUN: 17 mg/dL (ref 6–20)
CALCIUM: 9.3 mg/dL (ref 8.9–10.3)
CHLORIDE: 104 mmol/L (ref 101–111)
CO2: 31 mmol/L (ref 22–32)
Creatinine, Ser: 1.15 mg/dL — ABNORMAL HIGH (ref 0.44–1.00)
GFR calc non Af Amer: 43 mL/min — ABNORMAL LOW (ref >60)
GFR, EST AFRICAN AMERICAN: 50 mL/min — AB (ref >60)
Glucose, Bld: 77 mg/dL (ref 65–99)
POTASSIUM: 4 mmol/L (ref 3.5–5.1)
Sodium: 141 mmol/L (ref 135–145)

## 2016-06-10 LAB — IRON AND TIBC
IRON: 68 ug/dL (ref 28–170)
Saturation Ratios: 17 % (ref 10.4–31.8)
TIBC: 397 ug/dL (ref 250–450)
UIBC: 329 ug/dL

## 2016-06-10 LAB — CBC WITH DIFFERENTIAL/PLATELET
BASOS ABS: 0 10*3/uL (ref 0–0.1)
Basophils Relative: 0 %
Eosinophils Absolute: 0.1 10*3/uL (ref 0–0.7)
Eosinophils Relative: 1 %
HEMATOCRIT: 30 % — AB (ref 35.0–47.0)
HEMOGLOBIN: 9.9 g/dL — AB (ref 12.0–16.0)
LYMPHS PCT: 30 %
Lymphs Abs: 2.3 10*3/uL (ref 1.0–3.6)
MCH: 27.5 pg (ref 26.0–34.0)
MCHC: 32.9 g/dL (ref 32.0–36.0)
MCV: 83.6 fL (ref 80.0–100.0)
MONOS PCT: 36 %
Monocytes Absolute: 2.6 10*3/uL — ABNORMAL HIGH (ref 0.2–0.9)
Neutro Abs: 2.5 10*3/uL (ref 1.4–6.5)
Neutrophils Relative %: 33 %
Platelets: 204 10*3/uL (ref 150–440)
RBC: 3.59 MIL/uL — AB (ref 3.80–5.20)
RDW: 18.7 % — ABNORMAL HIGH (ref 11.5–14.5)
WBC: 7.5 10*3/uL (ref 3.6–11.0)

## 2016-06-10 LAB — FERRITIN: Ferritin: 11 ng/mL (ref 11–307)

## 2016-06-11 ENCOUNTER — Inpatient Hospital Stay: Payer: Medicare Other

## 2016-06-11 ENCOUNTER — Inpatient Hospital Stay: Payer: Medicare Other | Attending: Internal Medicine | Admitting: Internal Medicine

## 2016-06-11 VITALS — BP 146/69 | HR 89 | Temp 97.4°F | Resp 20 | Wt 110.0 lb

## 2016-06-11 DIAGNOSIS — Q2730 Arteriovenous malformation, site unspecified: Secondary | ICD-10-CM

## 2016-06-11 DIAGNOSIS — D500 Iron deficiency anemia secondary to blood loss (chronic): Secondary | ICD-10-CM

## 2016-06-11 DIAGNOSIS — J449 Chronic obstructive pulmonary disease, unspecified: Secondary | ICD-10-CM | POA: Diagnosis not present

## 2016-06-11 DIAGNOSIS — Z9981 Dependence on supplemental oxygen: Secondary | ICD-10-CM

## 2016-06-11 DIAGNOSIS — Z87891 Personal history of nicotine dependence: Secondary | ICD-10-CM

## 2016-06-11 DIAGNOSIS — R5382 Chronic fatigue, unspecified: Secondary | ICD-10-CM

## 2016-06-11 DIAGNOSIS — Z86718 Personal history of other venous thrombosis and embolism: Secondary | ICD-10-CM

## 2016-06-11 DIAGNOSIS — Z599 Problem related to housing and economic circumstances, unspecified: Secondary | ICD-10-CM

## 2016-06-11 DIAGNOSIS — E785 Hyperlipidemia, unspecified: Secondary | ICD-10-CM

## 2016-06-11 DIAGNOSIS — Z8542 Personal history of malignant neoplasm of other parts of uterus: Secondary | ICD-10-CM

## 2016-06-11 DIAGNOSIS — Z79899 Other long term (current) drug therapy: Secondary | ICD-10-CM

## 2016-06-11 DIAGNOSIS — I10 Essential (primary) hypertension: Secondary | ICD-10-CM

## 2016-06-11 DIAGNOSIS — M810 Age-related osteoporosis without current pathological fracture: Secondary | ICD-10-CM

## 2016-06-11 MED ORDER — SODIUM CHLORIDE 0.9 % IV SOLN
INTRAVENOUS | Status: DC
  Administered 2016-06-11: 16:00:00 via INTRAVENOUS
  Filled 2016-06-11: qty 1000

## 2016-06-11 MED ORDER — IRON SUCROSE 20 MG/ML IV SOLN
200.0000 mg | Freq: Once | INTRAVENOUS | Status: AC
  Administered 2016-06-11: 200 mg via INTRAVENOUS
  Filled 2016-06-11: qty 10

## 2016-06-11 MED ORDER — SODIUM CHLORIDE 0.9 % IV SOLN: 200.0000 mg | Freq: Once | INTRAVENOUS | Status: DC

## 2016-06-11 NOTE — Assessment & Plan Note (Signed)
#   Chronic anemia- at least from 2005 as per patient. Iron deficiency Anemia to be due to AV malformation.  Patient has been receiving intermittent IV iron infusions most recently every 3 months.    Hemoglobin is 9.7. Ferritin-22. Proceed with IV venofer today;  Also recommended PO Iron once a day.   # Patient has financial issues/continue Venofer at this time.  # IV Venofer labs in 3 months; follow-up with me in 6 months with labs IV Venofer.

## 2016-06-11 NOTE — Progress Notes (Signed)
Pt is here for follow up. C/o on going back pain. Right leg has some raw areas where pt stated she was told at [facility] that it was seborrhea.

## 2016-06-11 NOTE — Progress Notes (Signed)
[name] OFFICE PROGRESS NOTE  [name] Care Team: Rusty Aus, MD as PCP - General (Internal Medicine)   SUMMARY OF ONCOLOGIC HISTORY:  # 2005 [atleast] ANEMIA- Iron def; ;?  Other reasons; W/Platelet- N; mild monocytosis [~2.5]; NO BMbx;Hx of Procrit in past;  IV Venofer q 3 M;   # Unprovoked DVT [2005] currently not on anticoagulation; COPD on 0 2 [place]  INTERVAL HISTORY:   82 year-old female [name] with above history chronic anemia likely attributed to iron deficiency/AV malformation is here for follow-up. [name] last received IV iron in August 2017.  [name] continues to complain of chronic fatigue. She is chronic shortness of breath on O2/COPD.  Continues to deny any blood in stools or black stools. Her appetite is fair. No weight loss. No difficulty swallowing or pain with swallowing  REVIEW OF SYSTEMS:  A complete 10 point review of system is done which is negative except mentioned above/history of present illness.   PAST MEDICAL HISTORY :  Past Medical History:  Diagnosis Date  . Asthma without status asthmaticus   . COPD, severe ([place])   . Disc disease, degenerative, lumbar or lumbosacral   . DVT (deep venous thrombosis) ([place]) 2005  . Fibrocystic breast disease   . Gastric antral vascular ectasia   . History of uterine cancer   . HTN (hypertension)   . Hyperlipidemia   . Osteoporosis     PAST SURGICAL HISTORY :   Past Surgical History:  Procedure Laterality Date  . APPENDECTOMY    . CHOLECYSTECTOMY      FAMILY HISTORY :   Family History  Problem Relation Age of Onset  . Lung cancer Father     SOCIAL HISTORY:   Social History  Substance Use Topics  . Smoking status: Former Smoker    Years: 25.00    Types: Cigarettes    Quit date: 12/05/1988  . Smokeless tobacco: Not on file  . Alcohol use Not on file    ALLERGIES:  is allergic to codeine; penicillins; and sulfa antibiotics.  MEDICATIONS:  Current Outpatient Prescriptions   Medication Sig Dispense Refill  . albuterol (PROAIR HFA) 108 (90 BASE) MCG/ACT inhaler Inhale into the lungs.    . ALPRAZolam (XANAX) 0.5 MG tablet     . clobetasol ointment (TEMOVATE) 0.05 % Apply topically.    Marland Kitchen EPINEPHrine 0.3 mg/0.3 mL IJ SOAJ injection     . esomeprazole (NEXIUM) 20 MG capsule Take by mouth.    Marland Kitchen HYDROcodone-acetaminophen (NORCO/VICODIN) 5-325 MG tablet Take by mouth.    . hydrocortisone (PROCTOZONE-HC) 2.5 % rectal cream APPLY TO THE AFFECTED AREA(S) 3 TIMES A DAY AS DIRECTED.    Marland Kitchen ipratropium-albuterol (DUONEB) 0.5-2.5 (3) MG/3ML SOLN PLACE 1 VIAL IN NEBULIZER AND INHALE 3 TIMES A DAY    . lovastatin (MEVACOR) 40 MG tablet Take by mouth.    . sertraline (ZOLOFT) 50 MG tablet Take by mouth.    . tiotropium (SPIRIVA) 18 MCG inhalation capsule Place into inhaler and inhale.    . traMADol (ULTRAM) 50 MG tablet Take by mouth.    . fluticasone (FLONASE) 50 MCG/ACT nasal spray Place into the nose. Reported on 12/05/2015    . montelukast (SINGULAIR) 10 MG tablet Take by mouth.     No current facility-administered medications for this visit.    Facility-Administered Medications Ordered in Other Visits  Medication Dose Route Frequency Provider Last Rate Last Dose  . 0.9 %  sodium chloride infusion   Intravenous Continuous Lenetta Quaker  Ann Lions, MD   Stopped at 06/11/16 1611    PHYSICAL EXAMINATION: ECOG PERFORMANCE STATUS: 1 - Symptomatic but completely ambulatory  BP (!) 146/69 (BP Location: Left Arm, [name] Position: Sitting)   Pulse 89   Temp 97.4 F (36.3 C) (Tympanic)   Resp 20   Wt 110 lb (49.9 kg)   SpO2 90%   BMI 21.48 kg/m   Filed Weights   06/11/16 1440  Weight: 110 lb (49.9 kg)    GENERAL: Elderly Caucasian female [name]; walking herself Alert, no distress and comfortable. Alone. 2 L nasal cannula. EYES: no pallor or icterus OROPHARYNX: no thrush or ulceration; dentures. NECK: supple, no masses felt LYMPH:  no palpable lymphadenopathy in the  cervical, axillary or inguinal regions LUNGS: Decreased breath sounds bilaterally No wheeze or crackles HEART/CVS: regular rate & rhythm and no murmurs; No lower extremity edema ABDOMEN:abdomen soft, non-tender and normal bowel sounds Musculoskeletal:no cyanosis of digits and no clubbing; she wears a back brace. PSYCH: alert & oriented x 3 with fluent speech NEURO: no focal motor/sensory deficits SKIN:  no rashes or significant lesions  LABORATORY DATA:  I have reviewed the data as listed    Component Value Date/Time   NA 141 06/10/2016 1419   NA 139 12/19/2012 0850   K 4.0 06/10/2016 1419   K 4.0 12/19/2012 0850   CL 104 06/10/2016 1419   CL 103 12/19/2012 0850   CO2 31 06/10/2016 1419   CO2 29 12/19/2012 0850   GLUCOSE 77 06/10/2016 1419   GLUCOSE 116 (H) 12/19/2012 0850   BUN 17 06/10/2016 1419   BUN 16 12/19/2012 0850   CREATININE 1.15 (H) 06/10/2016 1419   CREATININE 1.16 12/19/2012 0850   CALCIUM 9.3 06/10/2016 1419   CALCIUM 8.8 12/19/2012 0850   PROT 7.4 09/03/2015 1351   ALBUMIN 4.4 09/03/2015 1351   AST 20 09/03/2015 1351   ALT 15 09/03/2015 1351   ALKPHOS 75 09/03/2015 1351   BILITOT 0.4 09/03/2015 1351   GFRNONAA 43 (L) 06/10/2016 1419   GFRNONAA 45 (L) 12/19/2012 0850   GFRAA 50 (L) 06/10/2016 1419   GFRAA 52 (L) 12/19/2012 0850    No results found for: SPEP, UPEP  Lab Results  Component Value Date   WBC 7.5 06/10/2016   NEUTROABS 2.5 06/10/2016   HGB 9.9 (L) 06/10/2016   HCT 30.0 (L) 06/10/2016   MCV Margaret.6 06/10/2016   PLT 204 06/10/2016      Chemistry      Component Value Date/Time   NA 141 06/10/2016 1419   NA 139 12/19/2012 0850   K 4.0 06/10/2016 1419   K 4.0 12/19/2012 0850   CL 104 06/10/2016 1419   CL 103 12/19/2012 0850   CO2 31 06/10/2016 1419   CO2 29 12/19/2012 0850   BUN 17 06/10/2016 1419   BUN 16 12/19/2012 0850   CREATININE 1.15 (H) 06/10/2016 1419   CREATININE 1.16 12/19/2012 0850      Component Value Date/Time    CALCIUM 9.3 06/10/2016 1419   CALCIUM 8.8 12/19/2012 0850   ALKPHOS 75 09/03/2015 1351   AST 20 09/03/2015 1351   ALT 15 09/03/2015 1351   BILITOT 0.4 09/03/2015 1351       ASSESSMENT & PLAN:  Iron deficiency anemia due to chronic blood loss # Chronic anemia- at least from 2005 as per [name]. Iron deficiency Anemia to be due to AV malformation.  [name] has been receiving intermittent IV iron infusions most recently every 3 months.  Hemoglobin is 9.7. Ferritin-22. Proceed with IV venofer today;  Also recommended PO Iron once a day.   # [name] has financial issues/continue Venofer at this time.  # IV Venofer labs in 3 months; follow-up with me in 6 months with labs IV Venofer.       Cammie Sickle, MD 06/11/2016 4:34 PM

## 2016-09-10 ENCOUNTER — Other Ambulatory Visit: Payer: Medicare Other

## 2016-09-10 ENCOUNTER — Ambulatory Visit: Payer: Medicare Other

## 2016-09-11 ENCOUNTER — Inpatient Hospital Stay: Payer: Medicare Other | Attending: Internal Medicine

## 2016-09-11 ENCOUNTER — Inpatient Hospital Stay: Payer: Medicare Other

## 2016-09-11 VITALS — BP 120/67 | HR 95 | Resp 20

## 2016-09-11 DIAGNOSIS — D500 Iron deficiency anemia secondary to blood loss (chronic): Secondary | ICD-10-CM

## 2016-09-11 DIAGNOSIS — Q2730 Arteriovenous malformation, site unspecified: Secondary | ICD-10-CM | POA: Diagnosis not present

## 2016-09-11 DIAGNOSIS — D509 Iron deficiency anemia, unspecified: Secondary | ICD-10-CM

## 2016-09-11 LAB — CBC WITH DIFFERENTIAL/PLATELET
BASOS PCT: 1 %
Basophils Absolute: 0.1 10*3/uL (ref 0–0.1)
EOS ABS: 0 10*3/uL (ref 0–0.7)
Eosinophils Relative: 0 %
HCT: 29.4 % — ABNORMAL LOW (ref 35.0–47.0)
Hemoglobin: 9.8 g/dL — ABNORMAL LOW (ref 12.0–16.0)
LYMPHS ABS: 1.5 10*3/uL (ref 1.0–3.6)
Lymphocytes Relative: 19 %
MCH: 27.9 pg (ref 26.0–34.0)
MCHC: 33.3 g/dL (ref 32.0–36.0)
MCV: 83.9 fL (ref 80.0–100.0)
MONO ABS: 3.1 10*3/uL — AB (ref 0.2–0.9)
Monocytes Relative: 38 %
NEUTROS PCT: 42 %
Neutro Abs: 3.4 10*3/uL (ref 1.4–6.5)
PLATELETS: 208 10*3/uL (ref 150–440)
RBC: 3.5 MIL/uL — ABNORMAL LOW (ref 3.80–5.20)
RDW: 19 % — AB (ref 11.5–14.5)
WBC: 8.1 10*3/uL (ref 3.6–11.0)

## 2016-09-11 LAB — IRON AND TIBC
Iron: 48 ug/dL (ref 28–170)
SATURATION RATIOS: 14 % (ref 10.4–31.8)
TIBC: 342 ug/dL (ref 250–450)
UIBC: 294 ug/dL

## 2016-09-11 LAB — FERRITIN: Ferritin: 25 ng/mL (ref 11–307)

## 2016-09-11 MED ORDER — SODIUM CHLORIDE 0.9 % IV SOLN
INTRAVENOUS | Status: DC
  Administered 2016-09-11: 15:00:00 via INTRAVENOUS
  Filled 2016-09-11: qty 1000

## 2016-09-11 MED ORDER — IRON SUCROSE 20 MG/ML IV SOLN
200.0000 mg | Freq: Once | INTRAVENOUS | Status: AC
  Administered 2016-09-11: 200 mg via INTRAVENOUS
  Filled 2016-09-11: qty 10

## 2016-09-11 MED ORDER — IRON SUCROSE 20 MG/ML IV SOLN: 200.0000 mg | Freq: Once | INTRAVENOUS | Status: DC

## 2016-09-12 ENCOUNTER — Telehealth: Payer: Self-pay | Admitting: *Deleted

## 2016-09-12 ENCOUNTER — Other Ambulatory Visit: Payer: Self-pay | Admitting: Internal Medicine

## 2016-09-12 NOTE — Telephone Encounter (Signed)
RN Spoke with patient. She had iron infusion yesterday as planned. Pt already aware of results.

## 2016-09-12 NOTE — Telephone Encounter (Signed)
-----   Message from Cammie Sickle, MD sent at 09/12/2016  7:46 AM EST ----- Please inform pt that her Iron studies are slightly low; and that she might benefit from IV iron; if interested;please schedule her for Iv iron. Thx

## 2016-12-04 ENCOUNTER — Inpatient Hospital Stay: Payer: Medicare Other | Attending: Hematology and Oncology

## 2016-12-04 DIAGNOSIS — Q2730 Arteriovenous malformation, site unspecified: Secondary | ICD-10-CM | POA: Diagnosis not present

## 2016-12-04 DIAGNOSIS — D500 Iron deficiency anemia secondary to blood loss (chronic): Secondary | ICD-10-CM | POA: Insufficient documentation

## 2016-12-04 LAB — IRON AND TIBC
Iron: 34 ug/dL (ref 28–170)
SATURATION RATIOS: 10 % — AB (ref 10.4–31.8)
TIBC: 343 ug/dL (ref 250–450)
UIBC: 309 ug/dL

## 2016-12-04 LAB — FERRITIN: Ferritin: 24 ng/mL (ref 11–307)

## 2016-12-05 ENCOUNTER — Other Ambulatory Visit: Payer: Medicare Other

## 2016-12-05 LAB — CBC WITH DIFFERENTIAL/PLATELET
BAND NEUTROPHILS: 7 %
Basophils Absolute: 0 10*3/uL (ref 0–0.1)
Basophils Relative: 0 %
EOS PCT: 0 %
Eosinophils Absolute: 0 10*3/uL (ref 0–0.7)
HEMATOCRIT: 30.6 % — AB (ref 35.0–47.0)
HEMOGLOBIN: 10.2 g/dL — AB (ref 12.0–16.0)
Lymphocytes Relative: 14 %
Lymphs Abs: 2.4 10*3/uL (ref 1.0–3.6)
MCH: 28.4 pg (ref 26.0–34.0)
MCHC: 33.3 g/dL (ref 32.0–36.0)
MCV: 85.3 fL (ref 80.0–100.0)
METAMYELOCYTES PCT: 1 %
MYELOCYTES: 1 %
Monocytes Absolute: 5.7 10*3/uL — ABNORMAL HIGH (ref 0.2–0.9)
Monocytes Relative: 34 %
NEUTROS ABS: 8.7 10*3/uL — AB (ref 1.4–6.5)
Neutrophils Relative %: 43 %
Platelets: 225 10*3/uL (ref 150–440)
RBC: 3.59 MIL/uL — ABNORMAL LOW (ref 3.80–5.20)
RDW: 17.9 % — ABNORMAL HIGH (ref 11.5–14.5)
SMEAR REVIEW: ADEQUATE
WBC: 16.8 10*3/uL — ABNORMAL HIGH (ref 3.6–11.0)

## 2016-12-12 ENCOUNTER — Ambulatory Visit: Payer: Medicare Other

## 2016-12-12 ENCOUNTER — Ambulatory Visit: Payer: Medicare Other | Admitting: Internal Medicine

## 2016-12-14 NOTE — Progress Notes (Signed)
[place] OFFICE PROGRESS NOTE  Patient Care Team: Rusty Aus, MD as PCP - General (Internal Medicine)   SUMMARY OF ONCOLOGIC HISTORY:  # 2005 [atleast] ANEMIA- Iron def; ;?  Other reasons; W/Platelet- N; mild monocytosis [~2.5]; NO BMbx;Hx of Procrit in past;  IV Venofer q 3 M;   # Unprovoked DVT [2005] currently not on anticoagulation; COPD on 0 2 [place]  INTERVAL HISTORY:   83 year-old female patient with above history chronic anemia likely attributed to iron deficiency/AV malformation is here for follow-up. Patient last received IV iron in August 2017.  Symptomatically, she notes fatigue. She has chronic shortness of breath due to COPD. She states that she hasn't smoked for 18 years up. She is on oxygen 3-1/2 L via nasal cannula. She last received Venofer on 09/11/2016. Typically she has received Venofer every 3 months.  She denies any melena or hematochezia.  REVIEW OF SYSTEMS:  A complete 10 point review of system is done which is negative except mentioned above/history of present illness.   PAST MEDICAL HISTORY :  Past Medical History:  Diagnosis Date  . Asthma without status asthmaticus   . COPD, severe ([place])   . Disc disease, degenerative, lumbar or lumbosacral   . DVT (deep venous thrombosis) ([place]) 2005  . Fibrocystic breast disease   . Gastric antral vascular ectasia   . History of uterine cancer   . HTN (hypertension)   . Hyperlipidemia   . Osteoporosis     PAST SURGICAL HISTORY :   Past Surgical History:  Procedure Laterality Date  . APPENDECTOMY    . CHOLECYSTECTOMY      FAMILY HISTORY :   Family History  Problem Relation Age of Onset  . Lung cancer Father     SOCIAL HISTORY:   Social History  Substance Use Topics  . Smoking status: Former Smoker    Years: 25.00    Types: Cigarettes    Quit date: 12/05/1988  . Smokeless tobacco: Not on file  . Alcohol use Not on file    ALLERGIES:  is allergic to codeine; penicillins; and sulfa  antibiotics.  MEDICATIONS:  Current Outpatient Prescriptions  Medication Sig Dispense Refill  . albuterol (PROAIR HFA) 108 (90 BASE) MCG/ACT inhaler Inhale into the lungs.    . ALPRAZolam (XANAX) 0.5 MG tablet     . clobetasol ointment (TEMOVATE) 0.05 % Apply topically.    Marland Kitchen EPINEPHrine 0.3 mg/0.3 mL IJ SOAJ injection     . esomeprazole (NEXIUM) 20 MG capsule Take by mouth.    Marland Kitchen HYDROcodone-acetaminophen (NORCO/VICODIN) 5-325 MG tablet Take by mouth.    . hydrocortisone (PROCTOZONE-HC) 2.5 % rectal cream APPLY TO THE AFFECTED AREA(S) 3 TIMES A DAY AS DIRECTED.    Marland Kitchen ipratropium-albuterol (DUONEB) 0.5-2.5 (3) MG/3ML SOLN PLACE 1 VIAL IN NEBULIZER AND INHALE 3 TIMES A DAY    . lovastatin (MEVACOR) 40 MG tablet Take by mouth.    . sertraline (ZOLOFT) 50 MG tablet Take by mouth.    . tiotropium (SPIRIVA) 18 MCG inhalation capsule Place into inhaler and inhale.    . traMADol (ULTRAM) 50 MG tablet Take by mouth.    . fluticasone (FLONASE) 50 MCG/ACT nasal spray Place into the nose. Reported on 12/05/2015    . montelukast (SINGULAIR) 10 MG tablet Take by mouth.    . predniSONE (DELTASONE) 20 MG tablet Take 20 mg by mouth daily.     No current facility-administered medications for this visit.  PHYSICAL EXAMINATION: ECOG PERFORMANCE STATUS: 1 - Symptomatic but completely ambulatory  BP (!) 142/56 (BP Location: Left Arm, Patient Position: Sitting)   Pulse 98   Temp 98.9 F (37.2 C) (Tympanic)   Resp 18   Wt 126 lb 2 oz (57.2 kg)   BMI 24.63 kg/m   Filed Weights   12/15/16 1429  Weight: 126 lb 2 oz (57.2 kg)   GENERAL:  Elderly woman sitting comfortably in the exam room in no acute distress.  She is alone today. MENTAL STATUS:  Alert and oriented to person, place and time. HEAD:  Curly brown hair.  Normocephalic, atraumatic, face symmetric, no Cushingoid features. EYES: Hazel/brown eyes.  Pupils equal round and reactive to light and accomodation.  No conjunctivitis or scleral  icterus. ENT:  Comunas in place.  Oropharynx clear without lesion.  Tongue normal. Mucous membranes moist.  RESPIRATORY:  Clear to auscultation without rales, wheezes or rhonchi. CARDIOVASCULAR:  Regular rate and rhythm without murmur, rub or gallop. ABDOMEN:  Soft, non-tender, with active bowel sounds, and no hepatosplenomegaly.  No masses. SKIN:  No rashes, ulcers or lesions. EXTREMITIES: Arthritic hands.  No edema, no skin discoloration or tenderness.  No palpable cords. LYMPH NODES: No palpable cervical, supraclavicular, axillary or inguinal adenopathy  NEUROLOGICAL: Unremarkable. PSYCH:  Appropriate.    LABORATORY DATA:  I have reviewed the data as listed    Component Value Date/Time   NA 141 06/10/2016 1419   NA 139 12/19/2012 0850   K 4.0 06/10/2016 1419   K 4.0 12/19/2012 0850   CL 104 06/10/2016 1419   CL 103 12/19/2012 0850   CO2 31 06/10/2016 1419   CO2 29 12/19/2012 0850   GLUCOSE 77 06/10/2016 1419   GLUCOSE 116 (H) 12/19/2012 0850   BUN 17 06/10/2016 1419   BUN 16 12/19/2012 0850   CREATININE 1.15 (H) 06/10/2016 1419   CREATININE 1.16 12/19/2012 0850   CALCIUM 9.3 06/10/2016 1419   CALCIUM 8.8 12/19/2012 0850   PROT 7.4 09/03/2015 1351   ALBUMIN 4.4 09/03/2015 1351   AST 20 09/03/2015 1351   ALT 15 09/03/2015 1351   ALKPHOS 75 09/03/2015 1351   BILITOT 0.4 09/03/2015 1351   GFRNONAA 43 (L) 06/10/2016 1419   GFRNONAA 45 (L) 12/19/2012 0850   GFRAA 50 (L) 06/10/2016 1419   GFRAA 52 (L) 12/19/2012 0850    No results found for: SPEP, UPEP  Lab Results  Component Value Date   WBC 16.8 (H) 12/04/2016   NEUTROABS 8.7 (H) 12/04/2016   HGB 10.2 (L) 12/04/2016   HCT 30.6 (L) 12/04/2016   MCV 85.3 12/04/2016   PLT 225 12/04/2016      Chemistry      Component Value Date/Time   NA 141 06/10/2016 1419   NA 139 12/19/2012 0850   K 4.0 06/10/2016 1419   K 4.0 12/19/2012 0850   CL 104 06/10/2016 1419   CL 103 12/19/2012 0850   CO2 31 06/10/2016 1419   CO2 29  12/19/2012 0850   BUN 17 06/10/2016 1419   BUN 16 12/19/2012 0850   CREATININE 1.15 (H) 06/10/2016 1419   CREATININE 1.16 12/19/2012 0850      Component Value Date/Time   CALCIUM 9.3 06/10/2016 1419   CALCIUM 8.8 12/19/2012 0850   ALKPHOS 75 09/03/2015 1351   AST 20 09/03/2015 1351   ALT 15 09/03/2015 1351   BILITOT 0.4 09/03/2015 1351       ASSESSMENT & PLAN:   Iron deficiency anemia  due to chronic blood loss # Chronic anemia- at least from 2005 as per patient. Iron deficiency Anemia to be due to AV malformation. Patient has been receiving intermittent IV iron infusions most recently every 3 months.    Hemoglobin is 10.2. Ferritin-24. Proceed with IV venofer today;  Also recommended PO Iron once a day.  # Patient has financial issues/continue Venofer at this time.  She does not wish to have weekly consecutive IV infusions to obtain a ferritin goal.  RTC in 3 months for labs (CBC with diff, ferritin, iron studies-day before) +/- Venofer. RTC in 6 months for MD (Dr Rogue Bussing), labs (CBC with diff, ferritin, iron studies- day before) +/- Venofer.    Lequita Asal, MD 12/15/2016 2:49 PM

## 2016-12-15 ENCOUNTER — Encounter: Payer: Self-pay | Admitting: Hematology and Oncology

## 2016-12-15 ENCOUNTER — Inpatient Hospital Stay: Payer: Medicare Other

## 2016-12-15 ENCOUNTER — Inpatient Hospital Stay: Payer: Medicare Other | Attending: Hematology and Oncology | Admitting: Hematology and Oncology

## 2016-12-15 VITALS — BP 142/56 | HR 98 | Temp 98.9°F | Resp 18 | Wt 126.1 lb

## 2016-12-15 DIAGNOSIS — Z599 Problem related to housing and economic circumstances, unspecified: Secondary | ICD-10-CM | POA: Diagnosis not present

## 2016-12-15 DIAGNOSIS — Z8542 Personal history of malignant neoplasm of other parts of uterus: Secondary | ICD-10-CM | POA: Insufficient documentation

## 2016-12-15 DIAGNOSIS — Q2730 Arteriovenous malformation, site unspecified: Secondary | ICD-10-CM | POA: Diagnosis not present

## 2016-12-15 DIAGNOSIS — I10 Essential (primary) hypertension: Secondary | ICD-10-CM | POA: Diagnosis not present

## 2016-12-15 DIAGNOSIS — D500 Iron deficiency anemia secondary to blood loss (chronic): Secondary | ICD-10-CM

## 2016-12-15 DIAGNOSIS — J449 Chronic obstructive pulmonary disease, unspecified: Secondary | ICD-10-CM | POA: Insufficient documentation

## 2016-12-15 DIAGNOSIS — E785 Hyperlipidemia, unspecified: Secondary | ICD-10-CM | POA: Insufficient documentation

## 2016-12-15 DIAGNOSIS — Z79899 Other long term (current) drug therapy: Secondary | ICD-10-CM | POA: Diagnosis not present

## 2016-12-15 DIAGNOSIS — D509 Iron deficiency anemia, unspecified: Secondary | ICD-10-CM

## 2016-12-15 DIAGNOSIS — Z87891 Personal history of nicotine dependence: Secondary | ICD-10-CM | POA: Diagnosis not present

## 2016-12-15 MED ORDER — IRON SUCROSE 20 MG/ML IV SOLN
200.0000 mg | Freq: Once | INTRAVENOUS | Status: AC
  Administered 2016-12-15: 200 mg via INTRAVENOUS
  Filled 2016-12-15: qty 10

## 2016-12-15 NOTE — Progress Notes (Signed)
Patient of Dr. Sharmaine Base.  Patient offers no complaints today.

## 2017-01-03 ENCOUNTER — Encounter: Payer: Self-pay | Admitting: Hematology and Oncology

## 2017-03-09 ENCOUNTER — Inpatient Hospital Stay: Payer: Medicare Other | Attending: Internal Medicine

## 2017-03-09 DIAGNOSIS — D500 Iron deficiency anemia secondary to blood loss (chronic): Secondary | ICD-10-CM | POA: Diagnosis present

## 2017-03-09 DIAGNOSIS — Q2730 Arteriovenous malformation, site unspecified: Secondary | ICD-10-CM | POA: Insufficient documentation

## 2017-03-09 LAB — CBC WITH DIFFERENTIAL/PLATELET
Basophils Absolute: 0.1 10*3/uL (ref 0–0.1)
Basophils Relative: 1 %
Eosinophils Absolute: 0 10*3/uL (ref 0–0.7)
Eosinophils Relative: 0 %
HCT: 31.3 % — ABNORMAL LOW (ref 35.0–47.0)
Hemoglobin: 10.4 g/dL — ABNORMAL LOW (ref 12.0–16.0)
Lymphocytes Relative: 17 %
Lymphs Abs: 1.2 10*3/uL (ref 1.0–3.6)
MCH: 28.9 pg (ref 26.0–34.0)
MCHC: 33.4 g/dL (ref 32.0–36.0)
MCV: 86.6 fL (ref 80.0–100.0)
Monocytes Absolute: 2.7 10*3/uL — ABNORMAL HIGH (ref 0.2–0.9)
Monocytes Relative: 37 %
Neutro Abs: 3.3 10*3/uL (ref 1.4–6.5)
Neutrophils Relative %: 45 %
Platelets: 194 10*3/uL (ref 150–440)
RBC: 3.61 MIL/uL — ABNORMAL LOW (ref 3.80–5.20)
RDW: 17.5 % — ABNORMAL HIGH (ref 11.5–14.5)
WBC: 7.3 10*3/uL (ref 3.6–11.0)

## 2017-03-09 LAB — FERRITIN: Ferritin: 15 ng/mL (ref 11–307)

## 2017-03-09 LAB — IRON AND TIBC
Iron: 59 ug/dL (ref 28–170)
Saturation Ratios: 16 % (ref 10.4–31.8)
TIBC: 378 ug/dL (ref 250–450)
UIBC: 319 ug/dL

## 2017-03-10 ENCOUNTER — Inpatient Hospital Stay: Payer: Medicare Other

## 2017-03-17 ENCOUNTER — Inpatient Hospital Stay: Payer: Medicare Other

## 2017-04-07 ENCOUNTER — Inpatient Hospital Stay: Payer: Medicare Other | Attending: Internal Medicine

## 2017-04-07 VITALS — BP 107/60 | HR 85 | Temp 97.0°F | Resp 18

## 2017-04-07 DIAGNOSIS — D500 Iron deficiency anemia secondary to blood loss (chronic): Secondary | ICD-10-CM | POA: Diagnosis present

## 2017-04-07 DIAGNOSIS — D509 Iron deficiency anemia, unspecified: Secondary | ICD-10-CM

## 2017-04-07 DIAGNOSIS — Q2730 Arteriovenous malformation, site unspecified: Secondary | ICD-10-CM | POA: Diagnosis not present

## 2017-04-07 MED ORDER — IRON SUCROSE 20 MG/ML IV SOLN
200.0000 mg | Freq: Once | INTRAVENOUS | Status: AC
  Administered 2017-04-07: 200 mg via INTRAVENOUS
  Filled 2017-04-07: qty 10

## 2017-06-10 ENCOUNTER — Inpatient Hospital Stay: Payer: Medicare Other | Attending: Internal Medicine

## 2017-06-10 DIAGNOSIS — D500 Iron deficiency anemia secondary to blood loss (chronic): Secondary | ICD-10-CM | POA: Diagnosis not present

## 2017-06-10 DIAGNOSIS — Q2730 Arteriovenous malformation, site unspecified: Secondary | ICD-10-CM | POA: Diagnosis not present

## 2017-06-10 LAB — CBC WITH DIFFERENTIAL/PLATELET
Basophils Absolute: 0.1 10*3/uL (ref 0–0.1)
Basophils Relative: 1 %
Eosinophils Absolute: 0.1 10*3/uL (ref 0–0.7)
Eosinophils Relative: 1 %
HCT: 29.1 % — ABNORMAL LOW (ref 35.0–47.0)
Hemoglobin: 9.4 g/dL — ABNORMAL LOW (ref 12.0–16.0)
Lymphocytes Relative: 25 %
Lymphs Abs: 1.6 10*3/uL (ref 1.0–3.6)
MCH: 28.4 pg (ref 26.0–34.0)
MCHC: 32.5 g/dL (ref 32.0–36.0)
MCV: 87.3 fL (ref 80.0–100.0)
Monocytes Absolute: 2.7 10*3/uL — ABNORMAL HIGH (ref 0.2–0.9)
Monocytes Relative: 42 %
Neutro Abs: 2 10*3/uL (ref 1.4–6.5)
Neutrophils Relative %: 31 %
Platelets: 186 10*3/uL (ref 150–440)
RBC: 3.33 MIL/uL — ABNORMAL LOW (ref 3.80–5.20)
RDW: 18.2 % — ABNORMAL HIGH (ref 11.5–14.5)
Smear Review: ADEQUATE
WBC: 6.5 10*3/uL (ref 3.6–11.0)

## 2017-06-10 LAB — IRON AND TIBC
Iron: 60 ug/dL (ref 28–170)
Saturation Ratios: 17 % (ref 10.4–31.8)
TIBC: 346 ug/dL (ref 250–450)
UIBC: 286 ug/dL

## 2017-06-10 LAB — FERRITIN: Ferritin: 28 ng/mL (ref 11–307)

## 2017-06-17 ENCOUNTER — Inpatient Hospital Stay: Payer: Medicare Other

## 2017-06-17 ENCOUNTER — Encounter: Payer: Self-pay | Admitting: Internal Medicine

## 2017-06-17 ENCOUNTER — Other Ambulatory Visit: Payer: Self-pay

## 2017-06-17 ENCOUNTER — Inpatient Hospital Stay: Payer: Medicare Other | Attending: Internal Medicine | Admitting: Internal Medicine

## 2017-06-17 VITALS — BP 125/68 | HR 102 | Temp 97.8°F | Resp 20

## 2017-06-17 DIAGNOSIS — D469 Myelodysplastic syndrome, unspecified: Secondary | ICD-10-CM | POA: Insufficient documentation

## 2017-06-17 DIAGNOSIS — Z8542 Personal history of malignant neoplasm of other parts of uterus: Secondary | ICD-10-CM | POA: Diagnosis not present

## 2017-06-17 DIAGNOSIS — D500 Iron deficiency anemia secondary to blood loss (chronic): Secondary | ICD-10-CM

## 2017-06-17 DIAGNOSIS — Z880 Allergy status to penicillin: Secondary | ICD-10-CM | POA: Diagnosis not present

## 2017-06-17 DIAGNOSIS — I10 Essential (primary) hypertension: Secondary | ICD-10-CM | POA: Insufficient documentation

## 2017-06-17 DIAGNOSIS — Z87891 Personal history of nicotine dependence: Secondary | ICD-10-CM | POA: Diagnosis not present

## 2017-06-17 DIAGNOSIS — J449 Chronic obstructive pulmonary disease, unspecified: Secondary | ICD-10-CM | POA: Insufficient documentation

## 2017-06-17 DIAGNOSIS — Z885 Allergy status to narcotic agent status: Secondary | ICD-10-CM | POA: Insufficient documentation

## 2017-06-17 DIAGNOSIS — M810 Age-related osteoporosis without current pathological fracture: Secondary | ICD-10-CM | POA: Diagnosis not present

## 2017-06-17 DIAGNOSIS — Z9981 Dependence on supplemental oxygen: Secondary | ICD-10-CM | POA: Diagnosis not present

## 2017-06-17 DIAGNOSIS — Z79899 Other long term (current) drug therapy: Secondary | ICD-10-CM | POA: Diagnosis not present

## 2017-06-17 DIAGNOSIS — Q2730 Arteriovenous malformation, site unspecified: Secondary | ICD-10-CM | POA: Insufficient documentation

## 2017-06-17 DIAGNOSIS — D649 Anemia, unspecified: Secondary | ICD-10-CM | POA: Diagnosis not present

## 2017-06-17 DIAGNOSIS — E785 Hyperlipidemia, unspecified: Secondary | ICD-10-CM | POA: Diagnosis not present

## 2017-06-17 MED ORDER — FERROUS FUM-IRON POLYSACCH-FA 162-115.2-1 MG PO CAPS: 90.0000 | ORAL_CAPSULE | Freq: Every day | ORAL | 0 refills | Status: DC

## 2017-06-17 NOTE — Progress Notes (Signed)
Patient here for follow-up for h/o IDA

## 2017-06-17 NOTE — Progress Notes (Signed)
[facility] OFFICE PROGRESS NOTE  Patient Care Team: Rusty Aus, MD as PCP - General (Internal Medicine)   SUMMARY OF ONCOLOGIC HISTORY:  # 2005 [atleast] ANEMIA- Iron def; ;?  Other reasons; W/Platelet- N; mild monocytosis [~2.5]; NO BMbx;Hx of Procrit in past;  IV Venofer q 3 M;   # Unprovoked DVT [2005] currently not on anticoagulation; COPD on 0 2 [place]  INTERVAL HISTORY:   83 year-old female patient with above history chronic anemia likely attributed to iron deficiency/AV malformation is here for follow-up. Patient last received IV iron in August 2017.  Patient is under a lot of "stress" as her husband is sick.  Patient continues to complain of chronic fatigue. She is chronic shortness of breath on O2/COPD.  Continues to deny any blood in stools or black stools. Her appetite is fair. No weight loss.  Patient is currently not on p.o. Iron.  She has been referred to endocrinology for bisphosphonate infusion by her PCP.  REVIEW OF SYSTEMS:  A complete 10 point review of system is done which is negative except mentioned above/history of present illness.   PAST MEDICAL HISTORY :  Past Medical History:  Diagnosis Date  . Asthma without status asthmaticus   . COPD, severe ([place])   . Disc disease, degenerative, lumbar or lumbosacral   . DVT (deep venous thrombosis) ([place]) 2005  . Fibrocystic breast disease   . Gastric antral vascular ectasia   . History of uterine cancer   . HTN (hypertension)   . Hyperlipidemia   . Osteoporosis     PAST SURGICAL HISTORY :   Past Surgical History:  Procedure Laterality Date  . APPENDECTOMY    . CHOLECYSTECTOMY      FAMILY HISTORY :   Family History  Problem Relation Age of Onset  . Lung cancer Father     SOCIAL HISTORY:   Social History   Tobacco Use  . Smoking status: Former Smoker    Years: 25.00    Types: Cigarettes    Last attempt to quit: 12/05/1988    Years since quitting: 28.5  . Smokeless tobacco: Never Used   Substance Use Topics  . Alcohol use: Not on file  . Drug use: Not on file    ALLERGIES:  is allergic to codeine; penicillins; and sulfa antibiotics.  MEDICATIONS:  Current Outpatient Medications  Medication Sig Dispense Refill  . albuterol (PROAIR HFA) 108 (90 BASE) MCG/ACT inhaler Inhale 1-2 puffs into the lungs every 6 (six) hours as needed for wheezing or shortness of breath.     . ALPRAZolam (XANAX) 0.5 MG tablet     . Biotin w/ Vitamins C & E (HAIR/SKIN/NAILS PO) Take 1 capsule by mouth daily.    Marland Kitchen esomeprazole (NEXIUM) 20 MG capsule Take 20 mg by mouth daily at 12 noon.     Marland Kitchen HYDROcodone-acetaminophen (NORCO/VICODIN) 5-325 MG tablet Take 1 tablet by mouth every 6 (six) hours as needed for moderate pain.     Marland Kitchen ipratropium-albuterol (DUONEB) 0.5-2.5 (3) MG/3ML SOLN PLACE 1 VIAL IN NEBULIZER AND INHALE 3 TIMES A DAY    . loratadine (CLARITIN) 10 MG tablet Take 10 mg by mouth daily.    Marland Kitchen lovastatin (MEVACOR) 40 MG tablet Take 40 mg by mouth at bedtime.     . NON FORMULARY Vein/vascular vitamin-1 tablet once daily    . sertraline (ZOLOFT) 50 MG tablet Take 50 mg by mouth daily.     Marland Kitchen tiotropium (SPIRIVA) 18 MCG inhalation capsule  Place 18 mcg into inhaler and inhale as needed.     . traMADol (ULTRAM) 50 MG tablet Take 50 mg by mouth.     . EPINEPHrine 0.3 mg/0.3 mL IJ SOAJ injection     . Ferrous Fum-Iron Polysacch-FA (TANDEM F) 162-115.2-1 MG CAPS Take 90 tablets by mouth daily. 30 each 0  . montelukast (SINGULAIR) 10 MG tablet Take 10 mg by mouth as needed (allergies).      No current facility-administered medications for this visit.     PHYSICAL EXAMINATION: ECOG PERFORMANCE STATUS: 1 - Symptomatic but completely ambulatory  BP 125/68   Pulse (!) 102   Temp 97.8 F (36.6 C) (Tympanic)   Resp 20   There were no vitals filed for this visit.  GENERAL: Elderly Caucasian female patient; walking herself Alert, no distress and comfortable. Alone. 2 L nasal cannula. EYES: no  pallor or icterus OROPHARYNX: no thrush or ulceration; dentures. NECK: supple, no masses felt LYMPH:  no palpable lymphadenopathy in the cervical, axillary or inguinal regions LUNGS: Decreased breath sounds bilaterally No wheeze or crackles HEART/CVS: regular rate & rhythm and no murmurs; No lower extremity edema ABDOMEN:abdomen soft, non-tender and normal bowel sounds Musculoskeletal:no cyanosis of digits and no clubbing; she wears a back brace. PSYCH: alert & oriented x 3 with fluent speech NEURO: no focal motor/sensory deficits SKIN:  no rashes or significant lesions  LABORATORY DATA:  I have reviewed the data as listed    Component Value Date/Time   NA 141 06/10/2016 1419   NA 139 12/19/2012 0850   K 4.0 06/10/2016 1419   K 4.0 12/19/2012 0850   CL 104 06/10/2016 1419   CL 103 12/19/2012 0850   CO2 31 06/10/2016 1419   CO2 29 12/19/2012 0850   GLUCOSE 77 06/10/2016 1419   GLUCOSE 116 (H) 12/19/2012 0850   BUN 17 06/10/2016 1419   BUN 16 12/19/2012 0850   CREATININE 1.15 (H) 06/10/2016 1419   CREATININE 1.16 12/19/2012 0850   CALCIUM 9.3 06/10/2016 1419   CALCIUM 8.8 12/19/2012 0850   PROT 7.4 09/03/2015 1351   ALBUMIN 4.4 09/03/2015 1351   AST 20 09/03/2015 1351   ALT 15 09/03/2015 1351   ALKPHOS 75 09/03/2015 1351   BILITOT 0.4 09/03/2015 1351   GFRNONAA 43 (L) 06/10/2016 1419   GFRNONAA 45 (L) 12/19/2012 0850   GFRAA 50 (L) 06/10/2016 1419   GFRAA 52 (L) 12/19/2012 0850    No results found for: SPEP, UPEP  Lab Results  Component Value Date   WBC 6.5 06/10/2017   NEUTROABS 2.0 06/10/2017   HGB 9.4 (L) 06/10/2017   HCT 29.1 (L) 06/10/2017   MCV 87.3 06/10/2017   PLT 186 06/10/2017      Chemistry      Component Value Date/Time   NA 141 06/10/2016 1419   NA 139 12/19/2012 0850   K 4.0 06/10/2016 1419   K 4.0 12/19/2012 0850   CL 104 06/10/2016 1419   CL 103 12/19/2012 0850   CO2 31 06/10/2016 1419   CO2 29 12/19/2012 0850   BUN 17 06/10/2016 1419    BUN 16 12/19/2012 0850   CREATININE 1.15 (H) 06/10/2016 1419   CREATININE 1.16 12/19/2012 0850      Component Value Date/Time   CALCIUM 9.3 06/10/2016 1419   CALCIUM 8.8 12/19/2012 0850   ALKPHOS 75 09/03/2015 1351   AST 20 09/03/2015 1351   ALT 15 09/03/2015 1351   BILITOT 0.4 09/03/2015 1351  ASSESSMENT & PLAN:  Iron deficiency anemia due to chronic blood loss # Chronic anemia- at least from 2005 as per patient. Iron deficiency Anemia to be due to AV malformation vs ? Low grade MDS [no bone marrow Bx].   # Today Hemoglobin is 9.7. Ferritin-28; iron sat-17%. HOLD IV venofer today- sec to financial issues;  Also recommended PO Iron once a day; Tandem script sent to pharmacy.  If hemoglobin continues to get worse-with adequate iron studies-MDS is a consideration.   # IV iron-venofer/labs- few days prior; in 4 months/MD.   Cammie Sickle, MD 06/17/2017 3:29 PM

## 2017-06-17 NOTE — Assessment & Plan Note (Addendum)
#   Chronic anemia- at least from 2005 as per patient. Iron deficiency Anemia to be due to AV malformation vs ? Low grade MDS [no bone marrow Bx].   # Today Hemoglobin is 9.7. Ferritin-28; iron sat-17%. HOLD IV venofer today- sec to financial issues;  Also recommended PO Iron once a day; Tandem script sent to pharmacy.  If hemoglobin continues to get worse-with adequate iron studies-MDS is a consideration.   # IV iron-venofer/labs- few days prior; in 4 months/MD.

## 2017-06-19 ENCOUNTER — Telehealth: Payer: Self-pay | Admitting: *Deleted

## 2017-06-19 MED ORDER — TANDEM PLUS 162-115.2-1 MG PO CAPS: 1.0000 | ORAL_CAPSULE | Freq: Every day | ORAL | 0 refills | Status: DC

## 2017-06-19 NOTE — Telephone Encounter (Signed)
Pharmacy called to report that Tandem F is no longer made and was replaced with Tandem Plus. Also question the direction of 90 caps daily.   New prescription sent for Tandem Plus with directions of 1 cap daily

## 2017-10-12 ENCOUNTER — Inpatient Hospital Stay: Payer: Medicare Other | Attending: Internal Medicine

## 2017-10-12 DIAGNOSIS — D500 Iron deficiency anemia secondary to blood loss (chronic): Secondary | ICD-10-CM | POA: Diagnosis not present

## 2017-10-12 LAB — COMPREHENSIVE METABOLIC PANEL
ALBUMIN: 4.5 g/dL (ref 3.5–5.0)
ALT: 14 U/L (ref 14–54)
ANION GAP: 8 (ref 5–15)
AST: 25 U/L (ref 15–41)
Alkaline Phosphatase: 63 U/L (ref 38–126)
BILIRUBIN TOTAL: 0.5 mg/dL (ref 0.3–1.2)
BUN: 20 mg/dL (ref 6–20)
CHLORIDE: 102 mmol/L (ref 101–111)
CO2: 29 mmol/L (ref 22–32)
Calcium: 9.2 mg/dL (ref 8.9–10.3)
Creatinine, Ser: 1.14 mg/dL — ABNORMAL HIGH (ref 0.44–1.00)
GFR calc Af Amer: 50 mL/min — ABNORMAL LOW (ref >60)
GFR calc non Af Amer: 43 mL/min — ABNORMAL LOW (ref >60)
GLUCOSE: 80 mg/dL (ref 65–99)
POTASSIUM: 4.5 mmol/L (ref 3.5–5.1)
SODIUM: 139 mmol/L (ref 135–145)
Total Protein: 7.6 g/dL (ref 6.5–8.1)

## 2017-10-12 LAB — CBC WITH DIFFERENTIAL/PLATELET
BASOS ABS: 0.1 10*3/uL (ref 0–0.1)
BASOS PCT: 1 %
EOS PCT: 0 %
Eosinophils Absolute: 0 10*3/uL (ref 0–0.7)
HEMATOCRIT: 26.2 % — AB (ref 35.0–47.0)
Hemoglobin: 8.7 g/dL — ABNORMAL LOW (ref 12.0–16.0)
LYMPHS ABS: 1.5 10*3/uL (ref 1.0–3.6)
LYMPHS PCT: 18 %
MCH: 27.4 pg (ref 26.0–34.0)
MCHC: 33.1 g/dL (ref 32.0–36.0)
MCV: 82.9 fL (ref 80.0–100.0)
MONOS PCT: 43 %
Monocytes Absolute: 3.7 10*3/uL — ABNORMAL HIGH (ref 0.2–0.9)
NEUTROS ABS: 3.2 10*3/uL (ref 1.4–6.5)
Neutrophils Relative %: 38 %
Platelets: 213 10*3/uL (ref 150–440)
RBC: 3.16 MIL/uL — ABNORMAL LOW (ref 3.80–5.20)
RDW: 19.1 % — AB (ref 11.5–14.5)
WBC: 8.5 10*3/uL (ref 3.6–11.0)

## 2017-10-12 LAB — IRON AND TIBC
IRON: 44 ug/dL (ref 28–170)
Saturation Ratios: 10 % — ABNORMAL LOW (ref 10.4–31.8)
TIBC: 453 ug/dL — AB (ref 250–450)
UIBC: 409 ug/dL

## 2017-10-12 LAB — FERRITIN: FERRITIN: 6 ng/mL — AB (ref 11–307)

## 2017-10-14 ENCOUNTER — Inpatient Hospital Stay: Payer: Medicare Other

## 2017-10-14 ENCOUNTER — Telehealth: Payer: Self-pay | Admitting: Internal Medicine

## 2017-10-14 ENCOUNTER — Inpatient Hospital Stay (HOSPITAL_BASED_OUTPATIENT_CLINIC_OR_DEPARTMENT_OTHER): Payer: Medicare Other | Admitting: Internal Medicine

## 2017-10-14 VITALS — BP 148/55 | HR 98 | Temp 97.4°F | Resp 18

## 2017-10-14 VITALS — BP 128/70 | HR 99 | Temp 98.9°F | Resp 22 | Ht 60.0 in | Wt 123.0 lb

## 2017-10-14 DIAGNOSIS — M810 Age-related osteoporosis without current pathological fracture: Secondary | ICD-10-CM

## 2017-10-14 DIAGNOSIS — Q2730 Arteriovenous malformation, site unspecified: Secondary | ICD-10-CM | POA: Diagnosis not present

## 2017-10-14 DIAGNOSIS — D500 Iron deficiency anemia secondary to blood loss (chronic): Secondary | ICD-10-CM | POA: Diagnosis not present

## 2017-10-14 DIAGNOSIS — I10 Essential (primary) hypertension: Secondary | ICD-10-CM

## 2017-10-14 DIAGNOSIS — Z86718 Personal history of other venous thrombosis and embolism: Secondary | ICD-10-CM

## 2017-10-14 DIAGNOSIS — Z87891 Personal history of nicotine dependence: Secondary | ICD-10-CM | POA: Diagnosis not present

## 2017-10-14 DIAGNOSIS — J449 Chronic obstructive pulmonary disease, unspecified: Secondary | ICD-10-CM

## 2017-10-14 MED ORDER — IRON SUCROSE 20 MG/ML IV SOLN
200.0000 mg | Freq: Once | INTRAVENOUS | Status: AC
  Administered 2017-10-14: 200 mg via INTRAVENOUS
  Filled 2017-10-14: qty 10

## 2017-10-14 MED ORDER — SODIUM CHLORIDE 0.9 % IV SOLN
Freq: Once | INTRAVENOUS | Status: AC
  Administered 2017-10-14: 15:00:00 via INTRAVENOUS
  Filled 2017-10-14: qty 1000

## 2017-10-14 NOTE — Telephone Encounter (Signed)
x

## 2017-10-14 NOTE — Progress Notes (Signed)
[facility] OFFICE PROGRESS NOTE  Patient Care Team: Rusty Aus, MD as PCP - General (Internal Medicine)   SUMMARY OF ONCOLOGIC HISTORY:  # 2005 [atleast] ANEMIA- Iron def; ;?  Other reasons; W/Platelet- N; mild monocytosis [~2.5]; NO BMbx;Hx of Procrit in past;  IV Venofer q 3 M;   # Unprovoked DVT [2005] currently not on anticoagulation; COPD on 0 2 [place]  INTERVAL HISTORY:   84 year-old female patient with above history chronic anemia likely attributed to iron deficiency/AV malformation is here for follow-up. Patient last received IV iron in August 2017.  Patient is extremely tired.  She denies any blood in stools or black colored stools.  No difficulty swallowing or painful swallowing  She is currently getting Reclast with endocrinology for osteoporosis.  No weight loss.  No nausea no vomiting.  REVIEW OF SYSTEMS:  A complete 10 point review of system is done which is negative except mentioned above/history of present illness.   PAST MEDICAL HISTORY :  Past Medical History:  Diagnosis Date  . Asthma without status asthmaticus   . COPD, severe ([place])   . Disc disease, degenerative, lumbar or lumbosacral   . DVT (deep venous thrombosis) ([place]) 2005  . Fibrocystic breast disease   . Gastric antral vascular ectasia   . History of uterine cancer   . HTN (hypertension)   . Hyperlipidemia   . Osteoporosis     PAST SURGICAL HISTORY :   Past Surgical History:  Procedure Laterality Date  . APPENDECTOMY    . CHOLECYSTECTOMY      FAMILY HISTORY :   Family History  Problem Relation Age of Onset  . Lung cancer Father     SOCIAL HISTORY:   Social History   Tobacco Use  . Smoking status: Former Smoker    Years: 25.00    Types: Cigarettes    Last attempt to quit: 12/05/1988    Years since quitting: 28.8  . Smokeless tobacco: Never Used  Substance Use Topics  . Alcohol use: Not on file  . Drug use: Not on file    ALLERGIES:  is allergic to codeine;  penicillins; and sulfa antibiotics.  MEDICATIONS:  Current Outpatient Medications  Medication Sig Dispense Refill  . albuterol (PROAIR HFA) 108 (90 BASE) MCG/ACT inhaler Inhale 1-2 puffs into the lungs every 6 (six) hours as needed for wheezing or shortness of breath.     . ALPRAZolam (XANAX) 0.5 MG tablet Take 0.5 mg by mouth 3 (three) times daily as needed for anxiety.     . Biotin w/ Vitamins C & E (HAIR/SKIN/NAILS PO) Take 1 capsule by mouth daily.    Marland Kitchen esomeprazole (NEXIUM) 20 MG capsule Take 20 mg by mouth daily at 12 noon.     Marland Kitchen HYDROcodone-acetaminophen (NORCO/VICODIN) 5-325 MG tablet Take 1 tablet by mouth every 6 (six) hours as needed for moderate pain.     Marland Kitchen ipratropium-albuterol (DUONEB) 0.5-2.5 (3) MG/3ML SOLN PLACE 1 VIAL IN NEBULIZER AND INHALE 3 TIMES A DAY    . loratadine (CLARITIN) 10 MG tablet Take 10 mg by mouth daily.    Marland Kitchen lovastatin (MEVACOR) 40 MG tablet Take 40 mg by mouth at bedtime.     . NON FORMULARY Vein/vascular vitamin-1 tablet once daily    . sertraline (ZOLOFT) 50 MG tablet Take 50 mg by mouth daily.     Marland Kitchen tiotropium (SPIRIVA) 18 MCG inhalation capsule Place 18 mcg into inhaler and inhale as needed.     Marland Kitchen  traMADol (ULTRAM) 50 MG tablet Take 50 mg by mouth.     . EPINEPHrine 0.3 mg/0.3 mL IJ SOAJ injection     . FeFum-FePo-FA-B Cmp-C-Zn-Mn-Cu (TANDEM PLUS) 162-115.2-1 MG CAPS Take 1 capsule by mouth daily. (Patient not taking: Reported on 10/14/2017) 90 each 0   No current facility-administered medications for this visit.     PHYSICAL EXAMINATION: ECOG PERFORMANCE STATUS: 1 - Symptomatic but completely ambulatory  BP 128/70   Pulse 99   Temp 98.9 F (37.2 C) (Tympanic)   Resp (!) 22   Ht 5' (1.524 m)   Wt 123 lb (55.8 kg)   BMI 24.02 kg/m   Filed Weights   10/14/17 1424  Weight: 123 lb (55.8 kg)    GENERAL: Elderly Caucasian female patient; walking herself Alert, no distress and comfortable. Alone. 2 L nasal cannula. EYES: no pallor or  icterus OROPHARYNX: no thrush or ulceration; dentures. NECK: supple, no masses felt LYMPH:  no palpable lymphadenopathy in the cervical, axillary or inguinal regions LUNGS: Decreased breath sounds bilaterally No wheeze or crackles HEART/CVS: regular rate & rhythm and no murmurs; No lower extremity edema ABDOMEN:abdomen soft, non-tender and normal bowel sounds Musculoskeletal:no cyanosis of digits and no clubbing; she wears a back brace. PSYCH: alert & oriented x 3 with fluent speech NEURO: no focal motor/sensory deficits SKIN:  no rashes or significant lesions  LABORATORY DATA:  I have reviewed the data as listed    Component Value Date/Time   NA 139 10/12/2017 1405   NA 139 12/19/2012 0850   K 4.5 10/12/2017 1405   K 4.0 12/19/2012 0850   CL 102 10/12/2017 1405   CL 103 12/19/2012 0850   CO2 29 10/12/2017 1405   CO2 29 12/19/2012 0850   GLUCOSE 80 10/12/2017 1405   GLUCOSE 116 (H) 12/19/2012 0850   BUN 20 10/12/2017 1405   BUN 16 12/19/2012 0850   CREATININE 1.14 (H) 10/12/2017 1405   CREATININE 1.16 12/19/2012 0850   CALCIUM 9.2 10/12/2017 1405   CALCIUM 8.8 12/19/2012 0850   PROT 7.6 10/12/2017 1405   ALBUMIN 4.5 10/12/2017 1405   AST 25 10/12/2017 1405   ALT 14 10/12/2017 1405   ALKPHOS 63 10/12/2017 1405   BILITOT 0.5 10/12/2017 1405   GFRNONAA 43 (L) 10/12/2017 1405   GFRNONAA 45 (L) 12/19/2012 0850   GFRAA 50 (L) 10/12/2017 1405   GFRAA 52 (L) 12/19/2012 0850    No results found for: SPEP, UPEP  Lab Results  Component Value Date   WBC 8.5 10/12/2017   NEUTROABS 3.2 10/12/2017   HGB 8.7 (L) 10/12/2017   HCT 26.2 (L) 10/12/2017   MCV 82.9 10/12/2017   PLT 213 10/12/2017      Chemistry      Component Value Date/Time   NA 139 10/12/2017 1405   NA 139 12/19/2012 0850   K 4.5 10/12/2017 1405   K 4.0 12/19/2012 0850   CL 102 10/12/2017 1405   CL 103 12/19/2012 0850   CO2 29 10/12/2017 1405   CO2 29 12/19/2012 0850   BUN 20 10/12/2017 1405   BUN 16  12/19/2012 0850   CREATININE 1.14 (H) 10/12/2017 1405   CREATININE 1.16 12/19/2012 0850      Component Value Date/Time   CALCIUM 9.2 10/12/2017 1405   CALCIUM 8.8 12/19/2012 0850   ALKPHOS 63 10/12/2017 1405   AST 25 10/12/2017 1405   ALT 14 10/12/2017 1405   BILITOT 0.5 10/12/2017 1405  ASSESSMENT & PLAN:  Iron deficiency anemia due to chronic blood loss # Chronic anemia- at least from 2005 as per patient. Iron deficiency Anemia to be due to AV malformation vs ? Low grade MDS [no bone marrow Bx].   # Today Hemoglobin is 8.7. Ferritin-6; iron sat-10%; recommend IV Venofer weekly x4. [financial issues]  # IV iron-venofer/labs- few days prior; in 6 months/MD.   Cammie Sickle, MD 10/14/2017 2:37 PM

## 2017-10-14 NOTE — Assessment & Plan Note (Addendum)
#   Chronic anemia- at least from 2005 as per patient. Iron deficiency Anemia to be due to AV malformation vs ? Low grade MDS [no bone marrow Bx].   # Today Hemoglobin is 8.7. Ferritin-6; iron sat-10%; recommend IV Venofer weekly x4. [financial issues]  # IV iron-venofer/labs- few days prior; in 6 months/MD.

## 2017-10-22 ENCOUNTER — Inpatient Hospital Stay: Payer: Medicare Other

## 2017-10-22 VITALS — BP 133/64 | HR 93 | Temp 96.5°F | Resp 18

## 2017-10-22 DIAGNOSIS — D509 Iron deficiency anemia, unspecified: Secondary | ICD-10-CM

## 2017-10-22 DIAGNOSIS — D500 Iron deficiency anemia secondary to blood loss (chronic): Secondary | ICD-10-CM | POA: Diagnosis not present

## 2017-10-22 MED ORDER — SODIUM CHLORIDE 0.9 % IV SOLN
Freq: Once | INTRAVENOUS | Status: AC
  Administered 2017-10-22: 15:00:00 via INTRAVENOUS
  Filled 2017-10-22: qty 1000

## 2017-10-22 MED ORDER — IRON SUCROSE 20 MG/ML IV SOLN
200.0000 mg | Freq: Once | INTRAVENOUS | Status: AC
  Administered 2017-10-22: 200 mg via INTRAVENOUS
  Filled 2017-10-22: qty 10

## 2017-10-29 ENCOUNTER — Inpatient Hospital Stay: Payer: Medicare Other

## 2017-10-29 VITALS — BP 129/62 | HR 83 | Temp 98.7°F | Resp 20

## 2017-10-29 DIAGNOSIS — D509 Iron deficiency anemia, unspecified: Secondary | ICD-10-CM

## 2017-10-29 DIAGNOSIS — D500 Iron deficiency anemia secondary to blood loss (chronic): Secondary | ICD-10-CM | POA: Diagnosis not present

## 2017-10-29 MED ORDER — SODIUM CHLORIDE 0.9 % IV SOLN
INTRAVENOUS | Status: DC
  Administered 2017-10-29: 15:00:00 via INTRAVENOUS
  Filled 2017-10-29: qty 1000

## 2017-10-29 MED ORDER — IRON SUCROSE 20 MG/ML IV SOLN
200.0000 mg | Freq: Once | INTRAVENOUS | Status: AC
  Administered 2017-10-29: 200 mg via INTRAVENOUS
  Filled 2017-10-29: qty 10

## 2017-11-05 ENCOUNTER — Inpatient Hospital Stay: Payer: Medicare Other

## 2017-11-05 VITALS — BP 120/62 | HR 91 | Temp 98.2°F | Resp 18

## 2017-11-05 DIAGNOSIS — D500 Iron deficiency anemia secondary to blood loss (chronic): Secondary | ICD-10-CM

## 2017-11-05 DIAGNOSIS — D509 Iron deficiency anemia, unspecified: Secondary | ICD-10-CM

## 2017-11-05 MED ORDER — IRON SUCROSE 20 MG/ML IV SOLN
200.0000 mg | Freq: Once | INTRAVENOUS | Status: AC
  Administered 2017-11-05: 200 mg via INTRAVENOUS
  Filled 2017-11-05: qty 10

## 2017-11-05 MED ORDER — SODIUM CHLORIDE 0.9 % IV SOLN
Freq: Once | INTRAVENOUS | Status: AC
  Administered 2017-11-05: 14:00:00 via INTRAVENOUS
  Filled 2017-11-05: qty 1000

## 2018-04-15 ENCOUNTER — Inpatient Hospital Stay: Payer: Medicare Other | Attending: Internal Medicine

## 2018-04-15 ENCOUNTER — Telehealth: Payer: Self-pay

## 2018-04-15 ENCOUNTER — Other Ambulatory Visit: Payer: Medicare Other

## 2018-04-15 DIAGNOSIS — Z86718 Personal history of other venous thrombosis and embolism: Secondary | ICD-10-CM | POA: Insufficient documentation

## 2018-04-15 DIAGNOSIS — I10 Essential (primary) hypertension: Secondary | ICD-10-CM | POA: Diagnosis not present

## 2018-04-15 DIAGNOSIS — Z992 Dependence on renal dialysis: Secondary | ICD-10-CM | POA: Diagnosis not present

## 2018-04-15 DIAGNOSIS — D500 Iron deficiency anemia secondary to blood loss (chronic): Secondary | ICD-10-CM | POA: Diagnosis present

## 2018-04-15 DIAGNOSIS — J449 Chronic obstructive pulmonary disease, unspecified: Secondary | ICD-10-CM | POA: Insufficient documentation

## 2018-04-15 DIAGNOSIS — Z7901 Long term (current) use of anticoagulants: Secondary | ICD-10-CM | POA: Diagnosis not present

## 2018-04-15 LAB — COMPREHENSIVE METABOLIC PANEL
ALBUMIN: 4.4 g/dL (ref 3.5–5.0)
ALK PHOS: 53 U/L (ref 38–126)
ALT: 11 U/L (ref 0–44)
ANION GAP: 7 (ref 5–15)
AST: 24 U/L (ref 15–41)
BUN: 16 mg/dL (ref 8–23)
CO2: 29 mmol/L (ref 22–32)
Calcium: 8.7 mg/dL — ABNORMAL LOW (ref 8.9–10.3)
Chloride: 106 mmol/L (ref 98–111)
Creatinine, Ser: 0.96 mg/dL (ref 0.44–1.00)
GFR calc Af Amer: >60 mL/min (ref >60)
GFR calc non Af Amer: 52 mL/min — ABNORMAL LOW (ref >60)
Glucose, Bld: 80 mg/dL (ref 70–99)
POTASSIUM: 3.9 mmol/L (ref 3.5–5.1)
SODIUM: 142 mmol/L (ref 135–145)
TOTAL PROTEIN: 7.2 g/dL (ref 6.5–8.1)
Total Bilirubin: 0.6 mg/dL (ref 0.3–1.2)

## 2018-04-15 LAB — CBC WITH DIFFERENTIAL/PLATELET
BASOS ABS: 0.1 10*3/uL (ref 0–0.1)
Basophils Relative: 1 %
Eosinophils Absolute: 0 10*3/uL (ref 0–0.7)
Eosinophils Relative: 0 %
HEMATOCRIT: 29.3 % — AB (ref 35.0–47.0)
HEMOGLOBIN: 9.8 g/dL — AB (ref 12.0–16.0)
LYMPHS PCT: 17 %
Lymphs Abs: 1.1 10*3/uL (ref 1.0–3.6)
MCH: 29.4 pg (ref 26.0–34.0)
MCHC: 33.2 g/dL (ref 32.0–36.0)
MCV: 88.3 fL (ref 80.0–100.0)
MONOS PCT: 38 %
Monocytes Absolute: 2.5 10*3/uL — ABNORMAL HIGH (ref 0.2–0.9)
NEUTROS PCT: 44 %
Neutro Abs: 3 10*3/uL (ref 1.4–6.5)
Platelets: 215 10*3/uL (ref 150–440)
RBC: 3.32 MIL/uL — AB (ref 3.80–5.20)
RDW: 17.7 % — ABNORMAL HIGH (ref 11.5–14.5)
WBC: 6.7 10*3/uL (ref 3.6–11.0)

## 2018-04-15 LAB — IRON AND TIBC
Iron: 69 ug/dL (ref 28–170)
SATURATION RATIOS: 20 % (ref 10.4–31.8)
TIBC: 354 ug/dL (ref 250–450)
UIBC: 285 ug/dL

## 2018-04-15 LAB — FERRITIN: Ferritin: 22 ng/mL (ref 11–307)

## 2018-04-15 NOTE — Telephone Encounter (Signed)
Patient came into cancer center today for a lab appointment, and Louise at Registration called and stated that patient's chart was marked deceased. It looks like patient was marked deceased on 2018/05/03 by Johnna Acosta, a patient account services rep.   After verification of patient's ID, I have marked the patient's chart as alive.  Lab encounter was also added on today.

## 2018-04-16 ENCOUNTER — Other Ambulatory Visit: Payer: Self-pay

## 2018-04-16 ENCOUNTER — Inpatient Hospital Stay (HOSPITAL_BASED_OUTPATIENT_CLINIC_OR_DEPARTMENT_OTHER): Payer: Medicare Other | Admitting: Internal Medicine

## 2018-04-16 ENCOUNTER — Encounter: Payer: Self-pay | Admitting: Internal Medicine

## 2018-04-16 ENCOUNTER — Inpatient Hospital Stay: Payer: Medicare Other

## 2018-04-16 ENCOUNTER — Ambulatory Visit: Payer: Medicare Other | Admitting: Internal Medicine

## 2018-04-16 ENCOUNTER — Ambulatory Visit: Payer: Medicare Other

## 2018-04-16 VITALS — BP 154/65 | HR 116 | Temp 97.6°F | Resp 22 | Ht 60.0 in | Wt 116.0 lb

## 2018-04-16 DIAGNOSIS — Z7901 Long term (current) use of anticoagulants: Secondary | ICD-10-CM

## 2018-04-16 DIAGNOSIS — D500 Iron deficiency anemia secondary to blood loss (chronic): Secondary | ICD-10-CM

## 2018-04-16 DIAGNOSIS — J449 Chronic obstructive pulmonary disease, unspecified: Secondary | ICD-10-CM

## 2018-04-16 DIAGNOSIS — Z86718 Personal history of other venous thrombosis and embolism: Secondary | ICD-10-CM

## 2018-04-16 DIAGNOSIS — I10 Essential (primary) hypertension: Secondary | ICD-10-CM

## 2018-04-16 DIAGNOSIS — Z992 Dependence on renal dialysis: Secondary | ICD-10-CM

## 2018-04-16 NOTE — Progress Notes (Signed)
[facility] OFFICE PROGRESS NOTE  Patient Care Team: Rusty Aus, MD as PCP - General (Internal Medicine)   SUMMARY OF ONCOLOGIC HISTORY:  # 2005 [atleast] ANEMIA- Iron def; ;?  Other reasons; W/Platelet- N; mild monocytosis [~2.5]; NO BMbx;Hx of Procrit in past;  IV Venofer q 3 M;   # Unprovoked DVT [2005] currently not on anticoagulation; COPD on 0 2 [place]  INTERVAL HISTORY:   84 year-old female patient with above history chronic anemia likely attributed to iron deficiency/AV malformation/ ? MDS is here for follow-up.  Patient complains of chronic fatigue.  However she attributes this to her chronic COPD.  She is on 2 to 3 L of oxygen.  No blood in stools black or stools.  She has not been taking iron pills on a regular basis because of stomach discomfort.  Review of Systems  Constitutional: Positive for malaise/fatigue and weight loss. Negative for chills, diaphoresis and fever.  HENT: Negative for nosebleeds and sore throat.   Eyes: Negative for double vision.  Respiratory: Positive for cough, sputum production and shortness of breath. Negative for hemoptysis and wheezing.   Cardiovascular: Negative for chest pain, palpitations, orthopnea and leg swelling.  Gastrointestinal: Negative for abdominal pain, blood in stool, constipation, diarrhea, heartburn, melena, nausea and vomiting.  Genitourinary: Negative for dysuria, frequency and urgency.  Musculoskeletal: Negative for back pain and joint pain.  Skin: Negative.  Negative for itching and rash.  Neurological: Negative for dizziness, tingling, focal weakness, weakness and headaches.  Endo/Heme/Allergies: Does not bruise/bleed easily.  Psychiatric/Behavioral: Negative for depression. The patient is not nervous/anxious and does not have insomnia.      PAST MEDICAL HISTORY :  Past Medical History:  Diagnosis Date  . Asthma without status asthmaticus   . COPD, severe ([place])   . Disc disease, degenerative, lumbar  or lumbosacral   . DVT (deep venous thrombosis) ([place]) 2005  . Fibrocystic breast disease   . Gastric antral vascular ectasia   . History of uterine cancer   . HTN (hypertension)   . Hyperlipidemia   . Osteoporosis     PAST SURGICAL HISTORY :   Past Surgical History:  Procedure Laterality Date  . APPENDECTOMY    . CHOLECYSTECTOMY      FAMILY HISTORY :   Family History  Problem Relation Age of Onset  . Lung cancer Father     SOCIAL HISTORY:   Social History   Tobacco Use  . Smoking status: Former Smoker    Years: 25.00    Types: Cigarettes    Last attempt to quit: 12/05/1988    Years since quitting: 29.3  . Smokeless tobacco: Never Used  Substance Use Topics  . Alcohol use: Not on file  . Drug use: Not on file    ALLERGIES:  is allergic to codeine; penicillins; and sulfa antibiotics.  MEDICATIONS:  Current Outpatient Medications  Medication Sig Dispense Refill  . albuterol (PROAIR HFA) 108 (90 BASE) MCG/ACT inhaler Inhale 1-2 puffs into the lungs every 6 (six) hours as needed for wheezing or shortness of breath.     . ALPRAZolam (XANAX) 0.5 MG tablet Take 0.5 mg by mouth 3 (three) times daily as needed for anxiety.     Marland Kitchen HYDROcodone-acetaminophen (NORCO/VICODIN) 5-325 MG tablet Take 1 tablet by mouth every 6 (six) hours as needed for moderate pain.     Marland Kitchen ipratropium-albuterol (DUONEB) 0.5-2.5 (3) MG/3ML SOLN PLACE 1 VIAL IN NEBULIZER AND INHALE 3 TIMES A DAY    .  loratadine (CLARITIN) 10 MG tablet Take 10 mg by mouth daily.    Marland Kitchen lovastatin (MEVACOR) 40 MG tablet Take 40 mg by mouth at bedtime.     . NON FORMULARY Vein/vascular vitamin-1 tablet once daily    . sertraline (ZOLOFT) 50 MG tablet Take 50 mg by mouth daily.     Marland Kitchen tiotropium (SPIRIVA HANDIHALER) 18 MCG inhalation capsule Take 1 Inhaler by mouth as needed for shortness of breath.    . traMADol (ULTRAM) 50 MG tablet Take 50 mg by mouth.     . Biotin w/ Vitamins C & E (HAIR/SKIN/NAILS PO) Take 1 capsule by  mouth daily.    Marland Kitchen EPINEPHrine 0.3 mg/0.3 mL IJ SOAJ injection     . esomeprazole (NEXIUM) 20 MG capsule Take 20 mg by mouth daily at 12 noon.     Marland Kitchen FeFum-FePo-FA-B Cmp-C-Zn-Mn-Cu (TANDEM PLUS) 162-115.2-1 MG CAPS Take 1 capsule by mouth daily. (Patient not taking: Reported on 10/14/2017) 90 each 0   No current facility-administered medications for this visit.     PHYSICAL EXAMINATION: ECOG PERFORMANCE STATUS: 1 - Symptomatic but completely ambulatory  BP (!) 154/65 (BP Location: Left Arm, Patient Position: Sitting)   Pulse (!) 116   Temp 97.6 F (36.4 C) (Tympanic)   Resp (!) 22   Ht 5' (1.524 m)   Wt 116 lb (52.6 kg)   BMI 22.65 kg/m   Filed Weights   04/16/18 1406  Weight: 116 lb (52.6 kg)    Physical Exam  Constitutional: She is oriented to person, place, and time.  Thin built frail appearing cachectic female patient.  She is walking herself.  She is on home O2.  HENT:  Head: Normocephalic and atraumatic.  Mouth/Throat: Oropharynx is clear and moist. No oropharyngeal exudate.  Eyes: Pupils are equal, round, and reactive to light.  Neck: Normal range of motion. Neck supple.  Cardiovascular: Normal rate and regular rhythm.  Pulmonary/Chest: No respiratory distress. She has no wheezes.  Decreased breath sounds bilaterally.    Abdominal: Soft. Bowel sounds are normal. She exhibits no distension and no mass. There is no tenderness. There is no rebound and no guarding.  Musculoskeletal: Normal range of motion. She exhibits no edema or tenderness.  Neurological: She is alert and oriented to person, place, and time.  Skin: Skin is warm.  Psychiatric: Affect normal.     LABORATORY DATA:  I have reviewed the data as listed    Component Value Date/Time   NA 142 04/15/2018 1438   NA 139 12/19/2012 0850   K 3.9 04/15/2018 1438   K 4.0 12/19/2012 0850   CL 106 04/15/2018 1438   CL 103 12/19/2012 0850   CO2 29 04/15/2018 1438   CO2 29 12/19/2012 0850   GLUCOSE 80 04/15/2018  1438   GLUCOSE 116 (H) 12/19/2012 0850   BUN 16 04/15/2018 1438   BUN 16 12/19/2012 0850   CREATININE 0.96 04/15/2018 1438   CREATININE 1.16 12/19/2012 0850   CALCIUM 8.7 (L) 04/15/2018 1438   CALCIUM 8.8 12/19/2012 0850   PROT 7.2 04/15/2018 1438   ALBUMIN 4.4 04/15/2018 1438   AST 24 04/15/2018 1438   ALT 11 04/15/2018 1438   ALKPHOS 53 04/15/2018 1438   BILITOT 0.6 04/15/2018 1438   GFRNONAA 52 (L) 04/15/2018 1438   GFRNONAA 45 (L) 12/19/2012 0850   GFRAA >60 04/15/2018 1438   GFRAA 52 (L) 12/19/2012 0850    No results found for: SPEP, UPEP  Lab Results  Component Value  Date   WBC 6.7 04/15/2018   NEUTROABS 3.0 04/15/2018   HGB 9.8 (L) 04/15/2018   HCT 29.3 (L) 04/15/2018   MCV 88.3 04/15/2018   PLT 215 04/15/2018      Chemistry      Component Value Date/Time   NA 142 04/15/2018 1438   NA 139 12/19/2012 0850   K 3.9 04/15/2018 1438   K 4.0 12/19/2012 0850   CL 106 04/15/2018 1438   CL 103 12/19/2012 0850   CO2 29 04/15/2018 1438   CO2 29 12/19/2012 0850   BUN 16 04/15/2018 1438   BUN 16 12/19/2012 0850   CREATININE 0.96 04/15/2018 1438   CREATININE 1.16 12/19/2012 0850      Component Value Date/Time   CALCIUM 8.7 (L) 04/15/2018 1438   CALCIUM 8.8 12/19/2012 0850   ALKPHOS 53 04/15/2018 1438   AST 24 04/15/2018 1438   ALT 11 04/15/2018 1438   BILITOT 0.6 04/15/2018 1438       ASSESSMENT & PLAN:  Iron deficiency anemia due to chronic blood loss # Chronic anemia- at least from 2005 as per patient. Iron deficiency Anemia to be due to AV malformation vs ? Low grade MDS [no bone marrow Bx].   # Today Hemoglobin is 9.8.iron sat-23%;  HOLD off IV venofer for now [financial issue].  Recommend PO iron every other day.   # COPD- on home O2 2-3 lit stable.  # IV iron-venofer/labs- few days prior; in 6 months/MD. to call us if needed sooner.  Cc; Dr.Miller.    Cammie Sickle, MD 04/16/2018 2:33 PM

## 2018-04-16 NOTE — Assessment & Plan Note (Addendum)
#   Chronic anemia- at least from 2005 as per patient. Iron deficiency Anemia to be due to AV malformation vs ? Low grade MDS [no bone marrow Bx].   # Today Hemoglobin is 9.8.iron sat-23%;  HOLD off IV venofer for now [financial issue].  Recommend PO iron every other day.   # COPD- on home O2 2-3 lit stable.  # IV iron-venofer/labs- few days prior; in 6 months/MD. to call us if needed sooner.  Cc; Dr.Miller.

## 2018-10-13 ENCOUNTER — Inpatient Hospital Stay: Payer: Medicare Other

## 2018-10-15 ENCOUNTER — Ambulatory Visit: Payer: Medicare Other

## 2018-10-15 ENCOUNTER — Ambulatory Visit: Payer: Medicare Other | Admitting: Internal Medicine

## 2018-12-15 ENCOUNTER — Other Ambulatory Visit: Payer: Medicare Other

## 2018-12-17 ENCOUNTER — Ambulatory Visit: Payer: Medicare Other | Admitting: Internal Medicine

## 2018-12-17 ENCOUNTER — Ambulatory Visit: Payer: Medicare Other

## 2019-02-22 ENCOUNTER — Other Ambulatory Visit: Payer: Medicare Other

## 2019-02-25 ENCOUNTER — Ambulatory Visit: Payer: Medicare Other | Admitting: Internal Medicine

## 2019-02-25 ENCOUNTER — Ambulatory Visit: Payer: Medicare Other

## 2019-07-11 ENCOUNTER — Other Ambulatory Visit: Payer: Self-pay

## 2019-07-11 ENCOUNTER — Inpatient Hospital Stay
Admission: EM | Admit: 2019-07-11 | Discharge: 2019-07-15 | DRG: 193 | Disposition: A | Discharge status: Home Health | Payer: Medicare Other | Attending: Internal Medicine | Admitting: Internal Medicine

## 2019-07-11 ENCOUNTER — Emergency Department: Payer: Medicare Other

## 2019-07-11 ENCOUNTER — Encounter: Payer: Self-pay | Admitting: *Deleted

## 2019-07-11 DIAGNOSIS — K5900 Constipation, unspecified: Secondary | ICD-10-CM | POA: Diagnosis present

## 2019-07-11 DIAGNOSIS — E872 Acidosis, unspecified: Secondary | ICD-10-CM

## 2019-07-11 DIAGNOSIS — L409 Psoriasis, unspecified: Secondary | ICD-10-CM | POA: Diagnosis present

## 2019-07-11 DIAGNOSIS — J181 Lobar pneumonia, unspecified organism: Principal | ICD-10-CM | POA: Diagnosis present

## 2019-07-11 DIAGNOSIS — J9621 Acute and chronic respiratory failure with hypoxia: Secondary | ICD-10-CM | POA: Diagnosis present

## 2019-07-11 DIAGNOSIS — D649 Anemia, unspecified: Secondary | ICD-10-CM

## 2019-07-11 DIAGNOSIS — J441 Chronic obstructive pulmonary disease with (acute) exacerbation: Secondary | ICD-10-CM | POA: Diagnosis present

## 2019-07-11 DIAGNOSIS — R7301 Impaired fasting glucose: Secondary | ICD-10-CM | POA: Diagnosis present

## 2019-07-11 DIAGNOSIS — Z20822 Contact with and (suspected) exposure to covid-19: Secondary | ICD-10-CM | POA: Diagnosis present

## 2019-07-11 DIAGNOSIS — Z8542 Personal history of malignant neoplasm of other parts of uterus: Secondary | ICD-10-CM

## 2019-07-11 DIAGNOSIS — E785 Hyperlipidemia, unspecified: Secondary | ICD-10-CM | POA: Diagnosis present

## 2019-07-11 DIAGNOSIS — E860 Dehydration: Secondary | ICD-10-CM | POA: Diagnosis present

## 2019-07-11 DIAGNOSIS — I248 Other forms of acute ischemic heart disease: Secondary | ICD-10-CM | POA: Diagnosis present

## 2019-07-11 DIAGNOSIS — F329 Major depressive disorder, single episode, unspecified: Secondary | ICD-10-CM | POA: Diagnosis present

## 2019-07-11 DIAGNOSIS — R739 Hyperglycemia, unspecified: Secondary | ICD-10-CM | POA: Diagnosis present

## 2019-07-11 DIAGNOSIS — R634 Abnormal weight loss: Secondary | ICD-10-CM | POA: Diagnosis present

## 2019-07-11 DIAGNOSIS — Z66 Do not resuscitate: Secondary | ICD-10-CM | POA: Diagnosis present

## 2019-07-11 DIAGNOSIS — I129 Hypertensive chronic kidney disease with stage 1 through stage 4 chronic kidney disease, or unspecified chronic kidney disease: Secondary | ICD-10-CM | POA: Diagnosis present

## 2019-07-11 DIAGNOSIS — N179 Acute kidney failure, unspecified: Secondary | ICD-10-CM | POA: Diagnosis present

## 2019-07-11 DIAGNOSIS — Z87891 Personal history of nicotine dependence: Secondary | ICD-10-CM

## 2019-07-11 DIAGNOSIS — J440 Chronic obstructive pulmonary disease with acute lower respiratory infection: Secondary | ICD-10-CM | POA: Diagnosis present

## 2019-07-11 DIAGNOSIS — Y92230 Patient room in hospital as the place of occurrence of the external cause: Secondary | ICD-10-CM | POA: Diagnosis not present

## 2019-07-11 DIAGNOSIS — R0602 Shortness of breath: Secondary | ICD-10-CM

## 2019-07-11 DIAGNOSIS — Z882 Allergy status to sulfonamides status: Secondary | ICD-10-CM

## 2019-07-11 DIAGNOSIS — D500 Iron deficiency anemia secondary to blood loss (chronic): Secondary | ICD-10-CM | POA: Diagnosis present

## 2019-07-11 DIAGNOSIS — R52 Pain, unspecified: Secondary | ICD-10-CM

## 2019-07-11 DIAGNOSIS — N1832 Chronic kidney disease, stage 3b: Secondary | ICD-10-CM | POA: Diagnosis present

## 2019-07-11 DIAGNOSIS — Z880 Allergy status to penicillin: Secondary | ICD-10-CM

## 2019-07-11 DIAGNOSIS — Z9981 Dependence on supplemental oxygen: Secondary | ICD-10-CM

## 2019-07-11 DIAGNOSIS — M810 Age-related osteoporosis without current pathological fracture: Secondary | ICD-10-CM | POA: Diagnosis present

## 2019-07-11 DIAGNOSIS — F419 Anxiety disorder, unspecified: Secondary | ICD-10-CM | POA: Diagnosis present

## 2019-07-11 DIAGNOSIS — T380X5A Adverse effect of glucocorticoids and synthetic analogues, initial encounter: Secondary | ICD-10-CM

## 2019-07-11 DIAGNOSIS — Z86718 Personal history of other venous thrombosis and embolism: Secondary | ICD-10-CM

## 2019-07-11 DIAGNOSIS — J189 Pneumonia, unspecified organism: Secondary | ICD-10-CM

## 2019-07-11 DIAGNOSIS — N189 Chronic kidney disease, unspecified: Secondary | ICD-10-CM | POA: Diagnosis not present

## 2019-07-11 DIAGNOSIS — B370 Candidal stomatitis: Secondary | ICD-10-CM | POA: Diagnosis not present

## 2019-07-11 DIAGNOSIS — Z801 Family history of malignant neoplasm of trachea, bronchus and lung: Secondary | ICD-10-CM

## 2019-07-11 DIAGNOSIS — Z79899 Other long term (current) drug therapy: Secondary | ICD-10-CM

## 2019-07-11 DIAGNOSIS — Z885 Allergy status to narcotic agent status: Secondary | ICD-10-CM

## 2019-07-11 LAB — TROPONIN I (HIGH SENSITIVITY)
Troponin I (High Sensitivity): 33 ng/L — ABNORMAL HIGH (ref <18)
Troponin I (High Sensitivity): 40 ng/L — ABNORMAL HIGH (ref <18)

## 2019-07-11 LAB — COMPREHENSIVE METABOLIC PANEL
ALT: 15 U/L (ref 0–44)
AST: 22 U/L (ref 15–41)
Albumin: 3.9 g/dL (ref 3.5–5.0)
Alkaline Phosphatase: 81 U/L (ref 38–126)
Anion gap: 13 (ref 5–15)
BUN: 34 mg/dL — ABNORMAL HIGH (ref 8–23)
CO2: 28 mmol/L (ref 22–32)
Calcium: 8.5 mg/dL — ABNORMAL LOW (ref 8.9–10.3)
Chloride: 100 mmol/L (ref 98–111)
Creatinine, Ser: 1.33 mg/dL — ABNORMAL HIGH (ref 0.44–1.00)
GFR calc Af Amer: 42 mL/min — ABNORMAL LOW (ref >60)
GFR calc non Af Amer: 36 mL/min — ABNORMAL LOW (ref >60)
Glucose, Bld: 138 mg/dL — ABNORMAL HIGH (ref 70–99)
Potassium: 3.7 mmol/L (ref 3.5–5.1)
Sodium: 141 mmol/L (ref 135–145)
Total Bilirubin: 0.7 mg/dL (ref 0.3–1.2)
Total Protein: 8 g/dL (ref 6.5–8.1)

## 2019-07-11 LAB — CBC
HCT: 32.3 % — ABNORMAL LOW (ref 36.0–46.0)
Hemoglobin: 9.9 g/dL — ABNORMAL LOW (ref 12.0–15.0)
MCH: 27.3 pg (ref 26.0–34.0)
MCHC: 30.7 g/dL (ref 30.0–36.0)
MCV: 89.2 fL (ref 80.0–100.0)
Platelets: 270 10*3/uL (ref 150–400)
RBC: 3.62 MIL/uL — ABNORMAL LOW (ref 3.87–5.11)
RDW: 17.7 % — ABNORMAL HIGH (ref 11.5–15.5)
WBC: 37.8 10*3/uL — ABNORMAL HIGH (ref 4.0–10.5)
nRBC: 0 % (ref 0.0–0.2)

## 2019-07-11 LAB — BASIC METABOLIC PANEL
Anion gap: 14 (ref 5–15)
BUN: 32 mg/dL — ABNORMAL HIGH (ref 8–23)
CO2: 25 mmol/L (ref 22–32)
Calcium: 8.2 mg/dL — ABNORMAL LOW (ref 8.9–10.3)
Chloride: 101 mmol/L (ref 98–111)
Creatinine, Ser: 1.18 mg/dL — ABNORMAL HIGH (ref 0.44–1.00)
GFR calc Af Amer: 48 mL/min — ABNORMAL LOW (ref >60)
GFR calc non Af Amer: 42 mL/min — ABNORMAL LOW (ref >60)
Glucose, Bld: 164 mg/dL — ABNORMAL HIGH (ref 70–99)
Potassium: 3.6 mmol/L (ref 3.5–5.1)
Sodium: 140 mmol/L (ref 135–145)

## 2019-07-11 LAB — LACTIC ACID, PLASMA: Lactic Acid, Venous: 1.1 mmol/L (ref 0.5–1.9)

## 2019-07-11 LAB — SAMPLE TO BLOOD BANK

## 2019-07-11 LAB — PROCALCITONIN: Procalcitonin: 2.48 ng/mL

## 2019-07-11 MED ORDER — ALBUTEROL SULFATE (2.5 MG/3ML) 0.083% IN NEBU
2.5000 mg | INHALATION_SOLUTION | RESPIRATORY_TRACT | Status: DC | PRN
  Administered 2019-07-12: 05:00:00 2.5 mg via RESPIRATORY_TRACT
  Filled 2019-07-11 (×2): qty 3

## 2019-07-11 MED ORDER — SODIUM CHLORIDE 0.9 % IV SOLN
500.0000 mg | INTRAVENOUS | Status: DC
  Administered 2019-07-12 – 2019-07-13 (×2): 500 mg via INTRAVENOUS
  Filled 2019-07-11 (×3): qty 500

## 2019-07-11 MED ORDER — IPRATROPIUM-ALBUTEROL 0.5-2.5 (3) MG/3ML IN SOLN
3.0000 mL | Freq: Once | RESPIRATORY_TRACT | Status: AC
  Administered 2019-07-12: 3 mL via RESPIRATORY_TRACT
  Filled 2019-07-11: qty 3

## 2019-07-11 MED ORDER — IPRATROPIUM-ALBUTEROL 0.5-2.5 (3) MG/3ML IN SOLN
3.0000 mL | Freq: Once | RESPIRATORY_TRACT | Status: AC
  Administered 2019-07-11: 3 mL via RESPIRATORY_TRACT
  Filled 2019-07-11: qty 3

## 2019-07-11 MED ORDER — METHYLPREDNISOLONE SODIUM SUCC 40 MG IJ SOLR: 40.0000 mg | Freq: Two times a day (BID) | INTRAMUSCULAR | Status: DC

## 2019-07-11 MED ORDER — SODIUM CHLORIDE 0.9 % IV BOLUS
500.0000 mL | Freq: Once | INTRAVENOUS | Status: AC
  Administered 2019-07-11: 500 mL via INTRAVENOUS

## 2019-07-11 MED ORDER — ALBUTEROL SULFATE (2.5 MG/3ML) 0.083% IN NEBU
2.5000 mg | INHALATION_SOLUTION | Freq: Once | RESPIRATORY_TRACT | Status: AC
  Administered 2019-07-12: 01:00:00 2.5 mg via RESPIRATORY_TRACT
  Filled 2019-07-11: qty 3

## 2019-07-11 MED ORDER — SODIUM CHLORIDE 0.9 % IV SOLN
500.0000 mg | Freq: Once | INTRAVENOUS | Status: AC
  Administered 2019-07-11: 500 mg via INTRAVENOUS

## 2019-07-11 MED ORDER — GUAIFENESIN-DM 100-10 MG/5ML PO SYRP
10.0000 mL | ORAL_SOLUTION | ORAL | Status: DC | PRN
  Administered 2019-07-12: 05:00:00 10 mL via ORAL
  Filled 2019-07-11 (×2): qty 10

## 2019-07-11 MED ORDER — SODIUM CHLORIDE 0.9 % IV SOLN
2.0000 g | Freq: Once | INTRAVENOUS | Status: AC
  Administered 2019-07-11: 2 g via INTRAVENOUS

## 2019-07-11 MED ORDER — SODIUM CHLORIDE 0.45 % IV SOLN: INTRAVENOUS | Status: DC

## 2019-07-11 MED ORDER — SODIUM CHLORIDE 0.9 % IV SOLN
2.0000 g | INTRAVENOUS | Status: DC
  Administered 2019-07-12 – 2019-07-15 (×4): 2 g via INTRAVENOUS
  Filled 2019-07-11: qty 2
  Filled 2019-07-11: qty 20
  Filled 2019-07-11: qty 2
  Filled 2019-07-11: qty 20

## 2019-07-11 MED ORDER — PREDNISONE 20 MG PO TABS: 40.0000 mg | ORAL_TABLET | Freq: Every day | ORAL | Status: DC

## 2019-07-11 MED ORDER — METHYLPREDNISOLONE SODIUM SUCC 125 MG IJ SOLR
125.0000 mg | Freq: Once | INTRAMUSCULAR | Status: AC
  Administered 2019-07-12: 01:00:00 125 mg via INTRAVENOUS
  Filled 2019-07-11: qty 2

## 2019-07-11 MED ORDER — ENOXAPARIN SODIUM 30 MG/0.3ML ~~LOC~~ SOLN
30.0000 mg | SUBCUTANEOUS | Status: DC
  Filled 2019-07-11: qty 0.3

## 2019-07-11 MED ORDER — IPRATROPIUM-ALBUTEROL 0.5-2.5 (3) MG/3ML IN SOLN
3.0000 mL | Freq: Four times a day (QID) | RESPIRATORY_TRACT | Status: DC
  Administered 2019-07-12 – 2019-07-13 (×8): 3 mL via RESPIRATORY_TRACT
  Filled 2019-07-11 (×8): qty 3

## 2019-07-11 NOTE — Progress Notes (Signed)
PHARMACY -  BRIEF ANTIBIOTIC NOTE   Pharmacy has received consult(s) for cefepime from an ED provider.  The patient's profile has been reviewed for ht/wt/allergies/indication/available labs.    One time order(s) placed for cefepime 2 g IV x1 by EDP  Further antibiotics/pharmacy consults should be ordered by admitting physician if indicated.                       Thank you, Rocky Morel 07/11/2019  8:41 PM

## 2019-07-11 NOTE — ED Provider Notes (Signed)
[facility] Emergency Department Provider Note    First MD Initiated Contact with Patient 07/11/19 1805     (approximate)  I have reviewed the triage vital signs and the nursing notes.   HISTORY  Chief Complaint Shortness of Breath and Weakness    HPI Margaret [name] is a 85 y.o. female the below listed past medical history presents to the ER for evaluation of worsening shortness of breath productive cough exertional dyspnea and malaise.  Has been having wet productive cough.  Has been on steroids for COPD exacerbation.  No recent antibiotics.  No known sick contacts.  No nausea or vomiting.  No diarrhea.  Wears 3 L of O2 at home.    Past Medical History:  Diagnosis Date  . Asthma without status asthmaticus   . COPD, severe ([place])   . Disc disease, degenerative, lumbar or lumbosacral   . DVT (deep venous thrombosis) ([place]) 2005  . Fibrocystic breast disease   . Gastric antral vascular ectasia   . History of uterine cancer   . HTN (hypertension)   . Hyperlipidemia   . Osteoporosis    Family History  Problem Relation Age of Onset  . Lung cancer Father    Past Surgical History:  Procedure Laterality Date  . APPENDECTOMY    . CHOLECYSTECTOMY     Patient Active Problem List   Diagnosis Date Noted  . Asthma without status asthmaticus 06/01/2015  . Bloodgood disease 06/01/2015  . Personal history of malignant neoplasm of other parts of uterus 06/01/2015  . Iron deficiency anemia due to chronic blood loss 01/02/2015  . Degeneration of intervertebral disc of lumbosacral region 04/13/2014  . Severe chronic obstructive pulmonary disease ([place]) 11/11/2013  . Deep vein thrombosis (DVT) ([place]) 11/11/2013  . Gastric antral vascular ectasia 11/11/2013  . BP (high blood pressure) 11/11/2013  . HLD (hyperlipidemia) 11/11/2013  . OP (osteoporosis) 11/11/2013      Prior to Admission medications   Medication Sig Start Date End Date Taking? Authorizing  Provider  albuterol (PROAIR HFA) 108 (90 BASE) MCG/ACT inhaler Inhale 1-2 puffs into the lungs every 6 (six) hours as needed for wheezing or shortness of breath.  04/18/15   [provider]  ALPRAZolam Duanne Moron) 0.5 MG tablet Take 0.5 mg by mouth 3 (three) times daily as needed for anxiety.  10/09/14   [provider]  Biotin w/ Vitamins C & E (HAIR/SKIN/NAILS PO) Take 1 capsule by mouth daily.    [provider]  EPINEPHrine 0.3 mg/0.3 mL IJ SOAJ injection  03/12/16   [provider]  esomeprazole (NEXIUM) 20 MG capsule Take 20 mg by mouth daily at 12 noon.  04/23/16   [provider]  FeFum-FePo-FA-B Cmp-C-Zn-Mn-Cu (TANDEM PLUS) 162-115.2-1 MG CAPS Take 1 capsule by mouth daily. Patient not taking: Reported on 10/14/2017 06/19/17   Cammie Sickle, MD  HYDROcodone-acetaminophen (NORCO/VICODIN) 5-325 MG tablet Take 1 tablet by mouth every 6 (six) hours as needed for moderate pain.  03/12/15   [provider]  ipratropium-albuterol (DUONEB) 0.5-2.5 (3) MG/3ML SOLN PLACE 1 VIAL IN NEBULIZER AND INHALE 3 TIMES A DAY 04/08/16   [provider]  loratadine (CLARITIN) 10 MG tablet Take 10 mg by mouth daily.    [provider]  lovastatin (MEVACOR) 40 MG tablet Take 40 mg by mouth at bedtime.  04/18/15   [provider]  NON FORMULARY Vein/vascular vitamin-1 tablet once daily    [provider]  sertraline (ZOLOFT)  50 MG tablet Take 50 mg by mouth daily.  04/18/15   [provider]  tiotropium (SPIRIVA HANDIHALER) 18 MCG inhalation capsule Take 1 Inhaler by mouth as needed for shortness of breath. 08/14/14   [provider]  traMADol (ULTRAM) 50 MG tablet Take 50 mg by mouth.  04/23/16   [provider]    Allergies Codeine, Penicillins, and Sulfa antibiotics    Social History Social History   Tobacco Use  . Smoking status: Former Smoker    Years: 25.00    Types: Cigarettes    Quit  date: 12/05/1988    Years since quitting: 30.6  . Smokeless tobacco: Never Used  Substance Use Topics  . Alcohol use: Not on file  . Drug use: Not on file    Review of Systems Patient denies headaches, rhinorrhea, blurry vision, numbness, shortness of breath, chest pain, edema, cough, abdominal pain, nausea, vomiting, diarrhea, dysuria, fevers, rashes or hallucinations unless otherwise stated above in HPI. ____________________________________________   PHYSICAL EXAM:  VITAL SIGNS: Vitals:   07/11/19 1830 07/11/19 1900  BP: (!) 151/76 (!) 158/86  Pulse:  (!) 114  Resp: 19 (!) 28  Temp:    SpO2:  95%    Constitutional: Alert and oriented. Ill and frail appearing Eyes: Conjunctivae are normal.  Head: Atraumatic. Nose: No congestion/rhinnorhea. Mouth/Throat: Mucous membranes are moist.   Neck: No stridor. Painless ROM.  Cardiovascular: Normal rate, regular rhythm. Grossly normal heart sounds.  Good peripheral circulation. Respiratory: tachypnea with diffuse rhonchi and expiratory wheeze Gastrointestinal: Soft and nontender. No distention. No abdominal bruits. No CVA tenderness. Genitourinary:  Musculoskeletal: No lower extremity tenderness nor edema.  No joint effusions. Neurologic:  Normal speech and language. No gross focal neurologic deficits are appreciated. No facial droop Skin:  Skin is warm, dry and intact. No rash noted. Psychiatric: Mood and affect are normal. Speech and behavior are normal.  ____________________________________________   LABS (all labs ordered are listed, but only abnormal results are displayed)  Results for orders placed or performed during the hospital encounter of 07/11/19 (from the past 24 hour(s))  Basic metabolic panel     Status: Abnormal   Collection Time: 07/11/19  1:32 PM  Result Value Ref Range   Sodium 140 135 - 145 mmol/L   Potassium 3.6 3.5 - 5.1 mmol/L   Chloride 101 98 - 111 mmol/L   CO2 25 22 - 32 mmol/L   Glucose, Bld 164  (H) 70 - 99 mg/dL   BUN 32 (H) 8 - 23 mg/dL   Creatinine, Ser 1.18 (H) 0.44 - 1.00 mg/dL   Calcium 8.2 (L) 8.9 - 10.3 mg/dL   GFR calc non Af Amer 42 (L) >60 mL/min   GFR calc Af Amer 48 (L) >60 mL/min   Anion gap 14 5 - 15  CBC     Status: Abnormal   Collection Time: 07/11/19  1:32 PM  Result Value Ref Range   WBC 37.8 (H) 4.0 - 10.5 K/uL   RBC 3.62 (L) 3.87 - 5.11 MIL/uL   Hemoglobin 9.9 (L) 12.0 - 15.0 g/dL   HCT 32.3 (L) 36.0 - 46.0 %   MCV 89.2 80.0 - 100.0 fL   MCH 27.3 26.0 - 34.0 pg   MCHC 30.7 30.0 - 36.0 g/dL   RDW 17.7 (H) 11.5 - 15.5 %   Platelets 270 150 - 400 K/uL   nRBC 0.0 0.0 - 0.2 %  Troponin I (High Sensitivity)     Status: Abnormal  Collection Time: 07/11/19  1:32 PM  Result Value Ref Range   Troponin I (High Sensitivity) 33 (H) <18 ng/L  Troponin I (High Sensitivity)     Status: Abnormal   Collection Time: 07/11/19  4:11 PM  Result Value Ref Range   Troponin I (High Sensitivity) 40 (H) <18 ng/L  Lactic acid, plasma     Status: None   Collection Time: 07/11/19  6:39 PM  Result Value Ref Range   Lactic Acid, Venous 1.1 0.5 - 1.9 mmol/L  Comprehensive metabolic panel     Status: Abnormal   Collection Time: 07/11/19  6:40 PM  Result Value Ref Range   Sodium 141 135 - 145 mmol/L   Potassium 3.7 3.5 - 5.1 mmol/L   Chloride 100 98 - 111 mmol/L   CO2 28 22 - 32 mmol/L   Glucose, Bld 138 (H) 70 - 99 mg/dL   BUN 34 (H) 8 - 23 mg/dL   Creatinine, Ser 1.33 (H) 0.44 - 1.00 mg/dL   Calcium 8.5 (L) 8.9 - 10.3 mg/dL   Total Protein 8.0 6.5 - 8.1 g/dL   Albumin 3.9 3.5 - 5.0 g/dL   AST 22 15 - 41 U/L   ALT 15 0 - 44 U/L   Alkaline Phosphatase 81 38 - 126 U/L   Total Bilirubin 0.7 0.3 - 1.2 mg/dL   GFR calc non Af Amer 36 (L) >60 mL/min   GFR calc Af Amer 42 (L) >60 mL/min   Anion gap 13 5 - 15  Procalcitonin     Status: None   Collection Time: 07/11/19  6:40 PM  Result Value Ref Range   Procalcitonin 2.48 ng/mL  Sample to Blood Bank     Status: None    Collection Time: 07/11/19  6:42 PM  Result Value Ref Range   Blood Bank Specimen SAMPLE AVAILABLE FOR TESTING    Sample Expiration      07/14/2019,2359 Performed at [facility], [address] [place], [place] [address]    ____________________________________________  EKG My review and personal interpretation at Time: 13:26   Indication: sob  Rate: 135  Rhythm: sinus Axis: normal Other: normal intervals, frequent pacs, poor r wave progression, no stemi ____________________________________________  RADIOLOGY  I personally reviewed all radiographic images ordered to evaluate for the above acute complaints and reviewed radiology reports and findings.  These findings were personally discussed with the patient.  Please see medical record for radiology report.  ____________________________________________   PROCEDURES  Procedure(s) performed:  .Critical Care Performed by: Merlyn Lot, MD Authorized by: Merlyn Lot, MD   Critical care provider statement:    Critical care time (minutes):  30   Critical care time was exclusive of:  Separately billable procedures and treating other patients   Critical care was necessary to treat or prevent imminent or life-threatening deterioration of the following conditions:  Respiratory failure   Critical care was time spent personally by me on the following activities:  Development of treatment plan with patient or surrogate, discussions with consultants, evaluation of patient's response to treatment, examination of patient, obtaining history from patient or surrogate, ordering and performing treatments and interventions, ordering and review of laboratory studies, ordering and review of radiographic studies, pulse oximetry, re-evaluation of patient's condition and review of old charts      Critical Care performed: yes ___________________________________________   INITIAL IMPRESSION / [place] / ED  COURSE  Pertinent labs & imaging results that were available during my care of  the patient were reviewed by me and considered in my medical decision making (see chart for details).   DDX: Asthma, copd, CHF, pna, ptx, malignancy, Pe, anemia, Covid 18  Allix A Hennings is a 85 y.o. who presents to the ED with was as described above.  Patient mildly tachypneic exam presentation concerning for COPD exacerbation.  Possible Covid but certainly appears to have pneumonia.  Will give antibiotic.  No recent hospitalizations.  Will cover with cefepime.  She has been on multiple rounds of steroids which may explain part of her leukocytosis.  Initial Covid antigen was negative.  Abdominal exam is soft and benign.  Lower suspicion for heart failure or ACS.  Will treat for COPD exacerbation.     The patient was evaluated in Emergency Department today for the symptoms described in the history of present illness. He/she was evaluated in the context of the global COVID-19 pandemic, which necessitated consideration that the patient might be at risk for infection with the SARS-CoV-2 virus that causes COVID-19. Institutional protocols and algorithms that pertain to the evaluation of patients at risk for COVID-19 are in a state of rapid change based on information released by regulatory bodies including the CDC and federal and state organizations. These policies and algorithms were followed during the patient's care in the ED.  As part of my medical decision making, I reviewed the following data within the [place] notes reviewed and incorporated, Labs reviewed, notes from prior ED visits and [place] Controlled Substance Database   ____________________________________________   FINAL CLINICAL IMPRESSION(S) / ED DIAGNOSES  Final diagnoses:  Shortness of breath  Pneumonia due to infectious organism, unspecified laterality, unspecified part of lung      NEW MEDICATIONS STARTED DURING THIS  VISIT:  New Prescriptions   No medications on file     Note:  This document was prepared using Dragon voice recognition software and may include unintentional dictation errors.    Merlyn Lot, MD 07/11/19 [phone]

## 2019-07-11 NOTE — ED Triage Notes (Signed)
Pt reportigng increased SOB, sore throat and weakness for over a week. Slight cough but no fever. Pt has been on 4L of oxygen for multiple years but the SOB has worsened per pt. Pt not able to speak in complete sentences in triage and appears very uncomfortable leaning over and hold her head. Increased WOB noted at this time.

## 2019-07-12 ENCOUNTER — Inpatient Hospital Stay: Payer: Medicare Other

## 2019-07-12 ENCOUNTER — Encounter: Payer: Self-pay | Admitting: Internal Medicine

## 2019-07-12 DIAGNOSIS — R634 Abnormal weight loss: Secondary | ICD-10-CM

## 2019-07-12 DIAGNOSIS — J189 Pneumonia, unspecified organism: Secondary | ICD-10-CM

## 2019-07-12 DIAGNOSIS — J441 Chronic obstructive pulmonary disease with (acute) exacerbation: Secondary | ICD-10-CM

## 2019-07-12 DIAGNOSIS — J181 Lobar pneumonia, unspecified organism: Secondary | ICD-10-CM

## 2019-07-12 DIAGNOSIS — E872 Acidosis, unspecified: Secondary | ICD-10-CM

## 2019-07-12 DIAGNOSIS — J9621 Acute and chronic respiratory failure with hypoxia: Secondary | ICD-10-CM

## 2019-07-12 DIAGNOSIS — D649 Anemia, unspecified: Secondary | ICD-10-CM

## 2019-07-12 DIAGNOSIS — N179 Acute kidney failure, unspecified: Secondary | ICD-10-CM

## 2019-07-12 DIAGNOSIS — E785 Hyperlipidemia, unspecified: Secondary | ICD-10-CM

## 2019-07-12 LAB — BASIC METABOLIC PANEL
Anion gap: 11 (ref 5–15)
BUN: 36 mg/dL — ABNORMAL HIGH (ref 8–23)
CO2: 25 mmol/L (ref 22–32)
Calcium: 7.6 mg/dL — ABNORMAL LOW (ref 8.9–10.3)
Chloride: 102 mmol/L (ref 98–111)
Creatinine, Ser: 1.34 mg/dL — ABNORMAL HIGH (ref 0.44–1.00)
GFR calc Af Amer: 41 mL/min — ABNORMAL LOW (ref >60)
GFR calc non Af Amer: 36 mL/min — ABNORMAL LOW (ref >60)
Glucose, Bld: 370 mg/dL — ABNORMAL HIGH (ref 70–99)
Potassium: 3.5 mmol/L (ref 3.5–5.1)
Sodium: 138 mmol/L (ref 135–145)

## 2019-07-12 LAB — CBC
HCT: 27.2 % — ABNORMAL LOW (ref 36.0–46.0)
Hemoglobin: 8.4 g/dL — ABNORMAL LOW (ref 12.0–15.0)
MCH: 26.8 pg (ref 26.0–34.0)
MCHC: 30.9 g/dL (ref 30.0–36.0)
MCV: 86.6 fL (ref 80.0–100.0)
Platelets: 190 10*3/uL (ref 150–400)
RBC: 3.14 MIL/uL — ABNORMAL LOW (ref 3.87–5.11)
RDW: 18 % — ABNORMAL HIGH (ref 11.5–15.5)
WBC: 24.3 10*3/uL — ABNORMAL HIGH (ref 4.0–10.5)
nRBC: 0 % (ref 0.0–0.2)

## 2019-07-12 LAB — HEMOGLOBIN A1C
Hgb A1c MFr Bld: 5.7 % — ABNORMAL HIGH (ref 4.8–5.6)
Mean Plasma Glucose: 116.89 mg/dL

## 2019-07-12 LAB — URINALYSIS, COMPLETE (UACMP) WITH MICROSCOPIC
Bacteria, UA: NONE SEEN
Bilirubin Urine: NEGATIVE
Glucose, UA: NEGATIVE mg/dL
Hgb urine dipstick: NEGATIVE
Ketones, ur: NEGATIVE mg/dL
Nitrite: NEGATIVE
Protein, ur: 100 mg/dL — AB
Specific Gravity, Urine: 1.023 (ref 1.005–1.030)
Squamous Epithelial / HPF: NONE SEEN (ref 0–5)
pH: 5 (ref 5.0–8.0)

## 2019-07-12 LAB — GLUCOSE, CAPILLARY
Glucose-Capillary: 115 mg/dL — ABNORMAL HIGH (ref 70–99)
Glucose-Capillary: 171 mg/dL — ABNORMAL HIGH (ref 70–99)

## 2019-07-12 LAB — URINE CULTURE: Culture: NO GROWTH

## 2019-07-12 LAB — ABO/RH: ABO/RH(D): O POS

## 2019-07-12 LAB — LACTIC ACID, PLASMA: Lactic Acid, Venous: 3.4 mmol/L (ref 0.5–1.9)

## 2019-07-12 LAB — C-REACTIVE PROTEIN: CRP: 28.7 mg/dL — ABNORMAL HIGH (ref <1.0)

## 2019-07-12 LAB — RESPIRATORY PANEL BY RT PCR (FLU A&B, COVID)
Influenza A by PCR: NEGATIVE
Influenza B by PCR: NEGATIVE
SARS Coronavirus 2 by RT PCR: NEGATIVE

## 2019-07-12 LAB — FIBRIN DERIVATIVES D-DIMER (ARMC ONLY): Fibrin derivatives D-dimer (ARMC): 4121.7 ng/mL (FEU) — ABNORMAL HIGH (ref 0.00–499.00)

## 2019-07-12 LAB — OCCULT BLOOD X 1 CARD TO LAB, STOOL: Fecal Occult Bld: POSITIVE — AB

## 2019-07-12 LAB — HIV ANTIBODY (ROUTINE TESTING W REFLEX): HIV Screen 4th Generation wRfx: NONREACTIVE

## 2019-07-12 MED ORDER — DOCUSATE SODIUM 50 MG/5ML PO LIQD
50.0000 mg | Freq: Every day | ORAL | Status: DC
  Administered 2019-07-13: 08:00:00 50 mg via ORAL
  Filled 2019-07-12: qty 10

## 2019-07-12 MED ORDER — PANTOPRAZOLE SODIUM 40 MG PO TBEC
40.0000 mg | DELAYED_RELEASE_TABLET | Freq: Every day | ORAL | Status: DC
  Administered 2019-07-12 – 2019-07-15 (×4): 40 mg via ORAL
  Filled 2019-07-12 (×4): qty 1

## 2019-07-12 MED ORDER — CALCIUM CARBONATE-VITAMIN D 500-200 MG-UNIT PO TABS
ORAL_TABLET | Freq: Every day | ORAL | Status: DC
  Administered 2019-07-14 – 2019-07-15 (×2): 1 via ORAL
  Filled 2019-07-12 (×3): qty 1

## 2019-07-12 MED ORDER — LORATADINE 10 MG PO TABS
10.0000 mg | ORAL_TABLET | Freq: Every day | ORAL | Status: DC
  Administered 2019-07-12 – 2019-07-15 (×4): 10 mg via ORAL
  Filled 2019-07-12 (×4): qty 1

## 2019-07-12 MED ORDER — POLYETHYLENE GLYCOL 3350 17 G PO PACK
17.0000 g | PACK | Freq: Every day | ORAL | Status: DC
  Administered 2019-07-12 – 2019-07-15 (×3): 17 g via ORAL
  Filled 2019-07-12 (×4): qty 1

## 2019-07-12 MED ORDER — PRAVASTATIN SODIUM 20 MG PO TABS
40.0000 mg | ORAL_TABLET | Freq: Every day | ORAL | Status: DC
  Administered 2019-07-12 – 2019-07-14 (×3): 40 mg via ORAL
  Filled 2019-07-12 (×3): qty 2

## 2019-07-12 MED ORDER — SODIUM CHLORIDE 0.9 % IV SOLN: INTRAVENOUS | Status: DC

## 2019-07-12 MED ORDER — DOCUSATE SODIUM 100 MG PO CAPS: 100.0000 mg | ORAL_CAPSULE | Freq: Every day | ORAL | Status: DC

## 2019-07-12 MED ORDER — ALPRAZOLAM 0.5 MG PO TABS
0.5000 mg | ORAL_TABLET | Freq: Three times a day (TID) | ORAL | Status: DC | PRN
  Administered 2019-07-13: 0.5 mg via ORAL
  Filled 2019-07-12: qty 1

## 2019-07-12 MED ORDER — CLOBETASOL PROPIONATE 0.05 % EX CREA
TOPICAL_CREAM | Freq: Two times a day (BID) | CUTANEOUS | Status: DC
  Filled 2019-07-12: qty 15

## 2019-07-12 MED ORDER — INSULIN ASPART 100 UNIT/ML ~~LOC~~ SOLN
0.0000 [IU] | Freq: Three times a day (TID) | SUBCUTANEOUS | Status: DC
  Administered 2019-07-13 (×2): 2 [IU] via SUBCUTANEOUS
  Administered 2019-07-13 – 2019-07-14 (×2): 1 [IU] via SUBCUTANEOUS
  Filled 2019-07-12 (×4): qty 1

## 2019-07-12 MED ORDER — INSULIN ASPART 100 UNIT/ML ~~LOC~~ SOLN: 0.0000 [IU] | Freq: Every day | SUBCUTANEOUS | Status: DC

## 2019-07-12 MED ORDER — SERTRALINE HCL 50 MG PO TABS
50.0000 mg | ORAL_TABLET | Freq: Every day | ORAL | Status: DC
  Administered 2019-07-12 – 2019-07-15 (×4): 50 mg via ORAL
  Filled 2019-07-12 (×4): qty 1

## 2019-07-12 MED ORDER — HYDROCODONE-ACETAMINOPHEN 5-325 MG PO TABS: 1.0000 | ORAL_TABLET | Freq: Four times a day (QID) | ORAL | Status: DC | PRN

## 2019-07-12 MED ORDER — FERROUS SULFATE 325 (65 FE) MG PO TABS
325.0000 mg | ORAL_TABLET | Freq: Every day | ORAL | Status: DC
  Administered 2019-07-12 – 2019-07-15 (×4): 325 mg via ORAL
  Filled 2019-07-12 (×5): qty 1

## 2019-07-12 MED ORDER — METHYLPREDNISOLONE SODIUM SUCC 40 MG IJ SOLR
40.0000 mg | Freq: Two times a day (BID) | INTRAMUSCULAR | Status: DC
  Administered 2019-07-12 – 2019-07-14 (×5): 40 mg via INTRAVENOUS
  Filled 2019-07-12 (×5): qty 1

## 2019-07-12 NOTE — Progress Notes (Signed)
OT Cancellation Note  Patient Details Name: SORAH FALKENSTEIN MRN: [id number] DOB: 1930-07-17   Cancelled Treatment:    Reason Eval/Treat Not Completed: Medical issues which prohibited therapy. Consult received, chart reviewed. Pt's recent random glucose is 370 this morning. Will hold at this time and re-attempt in the afternoon if medically appropriate to participate.   Jeni Salles, MPH, MS, OTR/L ascom [phone] 07/12/19, 9:04 AM

## 2019-07-12 NOTE — Evaluation (Signed)
Occupational Therapy Evaluation Patient Details Name: Margaret [name] MRN: [id number] DOB: 09-Sep-1930 Today's Date: 07/12/2019    History of Present Illness Patient is an 85 year old female admitted reports of weakness, sore throad and increased SOB diagnosed wiith community aquired PNA. Home O2 3-4 lpm, PMH includes: COPD. Per testing, pt negative for bilat DVTs.   Clinical Impression   Pt seen for OT evaluation this date. Prior to hospital admission, pt was ambulating without AD and indep with ADL. Pt reports fear of falling and would prefer additional help for bathing to minimize risk of falling. Pt lives by herself in a level entry home. Currently pt demonstrates impairments in strength, activity tolerance, and balance requiring CGA and PRN Min A for LB ADL and functional mobility. On 4L O2 throughout session. Pt mildly unsteady during mobility. Pt educated in energy conservation strategies and pursed lip breathing. Pt would benefit from skilled OT to address noted impairments and functional limitations (see below for any additional details) in order to maximize safety and independence while minimizing falls risk and caregiver burden.  Upon hospital discharge, recommend pt discharge home with [place] and a [place] Aide to assist in bathing 1-2x/wk.    Follow Up Recommendations  Home health OT;Other (comment)(home health aide)    Equipment Recommendations  None recommended by OT    Recommendations for Other Services       Precautions / Restrictions Precautions Precautions: Fall Restrictions Weight Bearing Restrictions: No      Mobility Bed Mobility Overal bed mobility: Independent             General bed mobility comments: able to go from sitting on side of bed to supine independently  Transfers Overall transfer level: Needs assistance Equipment used: None Transfers: Sit to/from Stand Sit to Stand: Min guard              Balance Overall balance assessment: Needs  assistance Sitting-balance support: Feet supported Sitting balance-Leahy Scale: Good     Standing balance support: Single extremity supported;During functional activity Standing balance-Leahy Scale: Fair Standing balance comment: unsteady, would benefit from RW                           ADL either performed or assessed with clinical judgement   ADL Overall ADL's : Needs assistance/impaired                                       General ADL Comments: CGA for functional ADL transfers, PRN Min A for LB ADL, need to watch O2 sats with exertion     Vision Baseline Vision/History: Wears glasses Wears Glasses: At all times Patient Visual Report: No change from baseline       Perception     Praxis      Pertinent Vitals/Pain Pain Assessment: 0-10 Pain Score: 5  Pain Location: chronic upper back pain "from my osteoporosis" Pain Descriptors / Indicators: Aching Pain Intervention(s): Limited activity within patient's tolerance;Repositioned;Monitored during session     Hand Dominance Right   Extremity/Trunk Assessment Upper Extremity Assessment Upper Extremity Assessment: Generalized weakness(grossly 4-/5 bilat)   Lower Extremity Assessment Lower Extremity Assessment: Generalized weakness   Cervical / Trunk Assessment Cervical / Trunk Assessment: Kyphotic   Communication Communication Communication: HOH   Cognition Arousal/Alertness: Awake/alert Behavior During Therapy: WFL for tasks assessed/performed Overall Cognitive Status: Within Functional Limits for  tasks assessed                                     General Comments       Exercises Other Exercises Other Exercises: Pt amb with CGA and mild unsteadiness to bathroom, CGA to brush teeth at sink, O2 sats >95% throughout on 4L O2 Other Exercises: Pt educated in pursed lip breathing to support breath recovery during ADL   Shoulder Instructions      Home Living  Family/patient expects to be discharged to:: Private residence Living Arrangements: Alone   Type of Home: House Home Access: Level entry Entrance Stairs-Number of Steps: has basement but she does not go down there   Home Layout: One level     Bathroom Shower/Tub: Teacher, early years/pre: Standard     Home Equipment: Environmental consultant - 4 wheels;Cane - single point          Prior Functioning/Environment Level of Independence: Independent        Comments: patient reports she did not use AD PTA. Was able to drive occasionally. 4L O2 at home per pt.        OT Problem List: Decreased strength;Decreased activity tolerance;Pain;Impaired balance (sitting and/or standing);Decreased knowledge of use of DME or AE      OT Treatment/Interventions: Self-care/ADL training;Therapeutic exercise;Therapeutic activities;Energy conservation;DME and/or AE instruction;Patient/family education;Balance training    OT Goals(Current goals can be found in the care plan section) Acute Rehab OT Goals Patient Stated Goal: to feel better, go home OT Goal Formulation: With patient Time For Goal Achievement: 07/26/19 Potential to Achieve Goals: Good ADL Goals Pt Will Perform Lower Body Dressing: with modified independence;sit to/from stand Pt Will Transfer to Toilet: with supervision;ambulating;regular height toilet(LRAD for amb) Additional ADL Goal #1: Pt will verbalize plan to implement at least 2 learned energy conservation strategies to maximize safety/indep with ADL and mobility while minimizing falls risk and risk of over exertion/SOB.  OT Frequency: Min 1X/week   Barriers to D/C:            Co-evaluation              AM-PAC OT "6 Clicks" Daily Activity     Outcome Measure Help from another person eating meals?: None Help from another person taking care of personal grooming?: A Little Help from another person toileting, which includes using toliet, bedpan, or urinal?: A Little Help  from another person bathing (including washing, rinsing, drying)?: A Little Help from another person to put on and taking off regular upper body clothing?: None Help from another person to put on and taking off regular lower body clothing?: A Little 6 Click Score: 20   End of Session Equipment Utilized During Treatment: Gait belt;Oxygen  Activity Tolerance: Patient tolerated treatment well Patient left: in bed;with call bell/phone within reach;with bed alarm set  OT Visit Diagnosis: Other abnormalities of gait and mobility (R26.89);Muscle weakness (generalized) (M62.81)                Time: 8588-5027 OT Time Calculation (min): 31 min Charges:  OT General Charges $OT Visit: 1 Visit OT Evaluation $OT Eval Low Complexity: 1 Low OT Treatments $Self Care/Home Management : 8-22 mins  Jeni Salles, MPH, MS, OTR/L ascom [phone] 07/12/19, 4:39 PM

## 2019-07-12 NOTE — Progress Notes (Signed)
Initial Nutrition Assessment  RD working remotely.  DOCUMENTATION CODES:   Not applicable  INTERVENTION:  Provide Ensure Enlive po BID, each supplement provides 350 kcal and 20 grams of protein.  NUTRITION DIAGNOSIS:   Increased nutrient needs related to catabolic illness(COPD) as evidenced by estimated needs.  GOAL:   Patient will meet greater than or equal to 90% of their needs  MONITOR:   PO intake, Supplement acceptance, Labs, Weight trends, I & O's  REASON FOR ASSESSMENT:   Consult COPD Protocol  ASSESSMENT:   85 year old female with PMHx of COPD, HTN, HLD, asthma, hx uterine cancer admitted with PNA, COPD exacerbation.   Attempted to call patient over the phone for nutrition/weight history but she was unable to answer. According to chart she ate 60% of her breakfast this morning and then 100% of her lunch. She has increased calorie/protein needs in setting of COPD.  According to MD note patient has had weight loss. Patient was 52.6 kg on 04/16/2018 and is now 44.9 kg (99 lbs). She has lost 7.7 kg (14.6% body weight) over 14 months, which is not significant for time frame but is still concerning.  Medications reviewed and include: Oscal with D daily, ferrous sulfate 325 mg daily, Novolog 0-9 units TID, Novolog 0-5 units QHS, Solu-Medrol 40 mg Q12hrs IV, pantoprazole, Miralax, sertraline, NS at 30 mL/hr, azithromycin, ceftriaxone.  Labs reviewed: BUN 36, Creatinine 1.34.  RD suspects patient is malnourished but is unable to determine without completing NFPE.  NUTRITION - FOCUSED PHYSICAL EXAM:  Unable to complete.  Diet Order:   Diet Order            Diet Heart Room service appropriate? Yes; Fluid consistency: Thin  Diet effective now             EDUCATION NEEDS:   No education needs have been identified at this time  Skin:  Skin Assessment: Reviewed RN Assessment  Last BM:  07/12/2019 per chart  Height:   Ht Readings from Last 1 Encounters:   07/11/19 5\' 1"  (1.549 m)   Weight:   Wt Readings from Last 1 Encounters:  07/11/19 44.9 kg   Ideal Body Weight:  47.7 kg  BMI:  Body mass index is 18.71 kg/m.  Estimated Nutritional Needs:   Kcal:  1200-1400  Protein:  60-70 grams  Fluid:  1.2-1.4 L/day  Jacklynn Barnacle, MS, RD, LDN Office: [phone] Pager: [phone] After Hours/Weekend Pager: [phone]

## 2019-07-12 NOTE — TOC Initial Note (Signed)
Transition of Care [facility]) - Initial/Assessment Note    Patient Details  Name: Margaret [name] MRN: [id number] Date of Birth: [birth year]  Transition of Care [facility]) CM/SW Contact:    Shelbie Ammons, RN Phone Number: 07/12/2019, 3:25 PM  Clinical Narrative:            RNCM attempted to assess patient at bedside however patient in the middle of procedure with nurse. Called into room later in afternoon and OT in room. Will follow up for [facility] assessment.                Patient Goals and CMS Choice        Expected Discharge Plan and Services                                                Prior Living Arrangements/Services                       Activities of Daily Living Home Assistive Devices/Equipment: Eyeglasses ADL Screening (condition at time of admission) Patient's cognitive ability adequate to safely complete daily activities?: Yes Is the patient deaf or have difficulty hearing?: Yes Does the patient have difficulty seeing, even when wearing glasses/contacts?: No Does the patient have difficulty concentrating, remembering, or making decisions?: No Patient able to express need for assistance with ADLs?: Yes Does the patient have difficulty dressing or bathing?: No Independently performs ADLs?: Yes (appropriate for developmental age) Does the patient have difficulty walking or climbing stairs?: Yes Weakness of Legs: Both Weakness of Arms/Hands: Both  Permission Sought/Granted                  Emotional Assessment              Admission diagnosis:  Shortness of breath [R06.02] CAP (community acquired pneumonia) [J18.9] Pneumonia due to infectious organism, unspecified laterality, unspecified part of lung [J18.9] Patient Active Problem List   Diagnosis Date Noted  . Lobar pneumonia ([place])   . Anemia   . Lactic acidosis   . Weight loss   . AKI (acute kidney injury) ([place])   . Community acquired pneumonia 07/11/2019  . Acute on chronic  respiratory failure with hypoxia ([place]) 07/11/2019  . COPD with acute exacerbation ([place]) 07/11/2019  . Suspected COVID-19 virus infection 07/11/2019  . CAP (community acquired pneumonia) 07/11/2019  . Asthma without status asthmaticus 06/01/2015  . Bloodgood disease 06/01/2015  . Personal history of malignant neoplasm of other parts of uterus 06/01/2015  . Iron deficiency anemia due to chronic blood loss 01/02/2015  . Degeneration of intervertebral disc of lumbosacral region 04/13/2014  . Severe chronic obstructive pulmonary disease ([place]) 11/11/2013  . Deep vein thrombosis (DVT) ([place]) 11/11/2013  . Gastric antral vascular ectasia 11/11/2013  . BP (high blood pressure) 11/11/2013  . HLD (hyperlipidemia) 11/11/2013  . OP (osteoporosis) 11/11/2013   PCP:  Rusty Aus, MD Pharmacy:   [place], [place] - [address] [place] [address] Phone: [phone] Fax: [phone]     Social Determinants of Health (SDOH) Interventions    Readmission Risk Interventions No flowsheet data found.

## 2019-07-12 NOTE — Evaluation (Signed)
Physical Therapy Evaluation Patient Details Name: Margaret [name] MRN: [id number] DOB: 1930/12/29 Today's Date: 07/12/2019   History of Present Illness  Patient is an 85 year old female admitted reports of weakness, sore throad and increased SOB diagnosed wiith community aquired PNA. Home O2 3-4 lpm, PMH includes: COPD  Clinical Impression  Patient received sitting up on side of bed. She is agreeable to PT assessment. Very HOH. O2 sats at rest on 4 lpm at 94%. Observable increased work of breathing. She is able to perform sit to stand transfer with min guard, ambulated 15 feet from bed to door and back. No assistive device, however patient is unsteady and holding to counter/bed with mobility. One small lob where she was able to grab bed. O2 sats upon returning to bed at 88% on 4 lpm. After a couple of minutes seated, she returned to 95%.  She will benefit from continued skilled PT while here to improve activity tolerance, strength and safety with mobility.     Follow Up Recommendations Home health PT    Equipment Recommendations  None recommended by PT    Recommendations for Other Services       Precautions / Restrictions Precautions Precautions: Fall Restrictions Weight Bearing Restrictions: No      Mobility  Bed Mobility Overal bed mobility: Independent             General bed mobility comments: able to go from sitting on side of bed to supine independently  Transfers Overall transfer level: Needs assistance Equipment used: None Transfers: Sit to/from Stand Sit to Stand: Min assist            Ambulation/Gait Ambulation/Gait assistance: Min assist Gait Distance (Feet): 15 Feet Assistive device: 1 person hand held assist Gait Pattern/deviations: Step-through pattern;Trunk flexed;Narrow base of support;Decreased stride length Gait velocity: decreased   General Gait Details: unsteady with ambulation, reaching for counter and bed. Occasional lob but able to catch  herself.  Stairs            Wheelchair Mobility    Modified Rankin (Stroke Patients Only)       Balance Overall balance assessment: Needs assistance Sitting-balance support: Feet supported Sitting balance-Leahy Scale: Good     Standing balance support: Single extremity supported;During functional activity Standing balance-Leahy Scale: Poor Standing balance comment: unsteady, would benefit from RW                             Pertinent Vitals/Pain Pain Assessment: No/denies pain    Home Living Family/patient expects to be discharged to:: Private residence Living Arrangements: Alone   Type of Home: House Home Access: Level entry   Entrance Stairs-Number of Steps: has basement but she does not go down there Home Layout: One level Home Equipment: Walker - 4 wheels;Cane - single point      Prior Function Level of Independence: Independent         Comments: patient reports she did not use AD PTA. Was able to drive occasionally.     Hand Dominance        Extremity/Trunk Assessment   Upper Extremity Assessment Upper Extremity Assessment: Defer to OT evaluation    Lower Extremity Assessment Lower Extremity Assessment: Generalized weakness    Cervical / Trunk Assessment Cervical / Trunk Assessment: Kyphotic  Communication   Communication: HOH  Cognition Arousal/Alertness: Awake/alert Behavior During Therapy: WFL for tasks assessed/performed Overall Cognitive Status: Within Functional Limits for tasks assessed  General Comments      Exercises     Assessment/Plan    PT Assessment Patient needs continued PT services  PT Problem List Decreased strength;Decreased activity tolerance;Decreased balance;Decreased mobility;Cardiopulmonary status limiting activity       PT Treatment Interventions DME instruction;Therapeutic exercise;Gait training;Balance training;Functional mobility  training;Therapeutic activities;Neuromuscular re-education;Patient/family education    PT Goals (Current goals can be found in the Care Plan section)  Acute Rehab PT Goals Patient Stated Goal: to feel better, go home PT Goal Formulation: With patient Time For Goal Achievement: 07/26/19 Potential to Achieve Goals: Fair    Frequency Min 2X/week   Barriers to discharge Decreased caregiver support      Co-evaluation               AM-PAC PT "6 Clicks" Mobility  Outcome Measure Help needed turning from your back to your side while in a flat bed without using bedrails?: None Help needed moving from lying on your back to sitting on the side of a flat bed without using bedrails?: None Help needed moving to and from a bed to a chair (including a wheelchair)?: A Little Help needed standing up from a chair using your arms (e.g., wheelchair or bedside chair)?: A Little Help needed to walk in hospital room?: A Little Help needed climbing 3-5 steps with a railing? : A Lot 6 Click Score: 19    End of Session Equipment Utilized During Treatment: Gait belt;Oxygen Activity Tolerance: Other (comment)(patient limited by SOB, weakness) Patient left: in bed;with bed alarm set;with call bell/phone within reach Nurse Communication: Mobility status PT Visit Diagnosis: Unsteadiness on feet (R26.81);Muscle weakness (generalized) (M62.81);Difficulty in walking, not elsewhere classified (R26.2)    Time: 4680-3212 PT Time Calculation (min) (ACUTE ONLY): 25 min   Charges:   PT Evaluation $PT Eval Moderate Complexity: 1 Mod PT Treatments $Gait Training: 8-22 mins        Ranika Mcniel, PT, GCS 07/12/19,1:59 PM

## 2019-07-12 NOTE — Progress Notes (Signed)
Inpatient Diabetes Program Recommendations  AACE/ADA: New Consensus Statement on Inpatient Glycemic Control (2015)  Target Ranges:  Prepandial:   less than 140 mg/dL      Peak postprandial:   less than 180 mg/dL (1-2 hours)      Critically ill patients:  140 - 180 mg/dL   Results for Margaret [name], Margaret [name] (MRN [id number]) as of 07/12/2019 09:51  Ref. Range 07/11/2019 18:40 07/12/2019 04:38  Glucose Latest Ref Range: 70 - 99 mg/dL 138 (H)  125 mg Solumedrol given at 1:07am 370 (H)    Admit with: Acute on chronic respiratory failure with hypoxia/ Community acquired pneumonia  History: COPD    MD- Note patient received 125 mg Solumedrol last night at 1am.  Now getting Solumedrol 40 mg BID.  Lab glucose 370 mg/dl this AM.  Please consider adding Novolog Sensitive Correction Scale/ SSI (0-9 units) TID AC + HS     --Will follow patient during hospitalization--  Wyn Quaker RN, MSN, CDE Diabetes Coordinator Inpatient Glycemic Control Team Team Pager: [phone] (8a-5p)

## 2019-07-12 NOTE — Progress Notes (Signed)
Patient ID: Margaret [name], female   DOB: 08-Feb-1931, 85 y.o.   MRN: [id number] [facility] PROGRESS NOTE  Margaret [name]:[id number] DOB: 1930-12-07 DOA: 07/11/2019 PCP: Rusty Aus, MD  HPI/Subjective: Patient coming in with shortness of breath, cough and sore throat.  Patient had a 20 pound weight loss.  Patient was found to be anemic by her primary care physician.  Patient feeling a little bit better with regards to her breathing.  She normally wears 3-1/2 L of oxygen at home.  Having some cough and some wheezing.  Objective: Vitals:   07/12/19 0723 07/12/19 0918  BP:  (!) 142/65  Pulse:  (!) 107  Resp:  20  Temp:  98.2 F (36.8 C)  SpO2: 94% 97%    Intake/Output Summary (Last 24 hours) at 07/12/2019 1336 Last data filed at 07/12/2019 0900 Gross per 24 hour  Intake 740 ml  Output --  Net 740 ml   Filed Weights   07/11/19 1331  Weight: 44.9 kg    ROS: Review of Systems  Constitutional: Negative for chills and fever.  HENT: Positive for sore throat.   Eyes: Negative for blurred vision.  Respiratory: Positive for cough, shortness of breath and wheezing.   Cardiovascular: Negative for chest pain.  Gastrointestinal: Negative for abdominal pain, constipation, diarrhea, nausea and vomiting.  Genitourinary: Negative for dysuria.  Musculoskeletal: Positive for joint pain.  Neurological: Negative for dizziness and headaches.   Exam: Physical Exam  Constitutional: She is oriented to person, place, and time.  HENT:  Nose: No mucosal edema.  Mouth/Throat: No oropharyngeal exudate or posterior oropharyngeal edema.  Eyes: Pupils are equal, round, and reactive to light. Conjunctivae, EOM and lids are normal.  Neck: Carotid bruit is not present.  Cardiovascular: S1 normal and S2 normal. Exam reveals no gallop.  No murmur heard. Pulses:      Dorsalis pedis pulses are 2+ on the right side and 2+ on the left side.  Respiratory: No respiratory distress. She has decreased  breath sounds in the right middle field, the right lower field and the left lower field. She has wheezes in the right lower field and the left lower field. She has no rhonchi. She has no rales.  GI: Soft. Bowel sounds are normal. There is no abdominal tenderness.  Musculoskeletal:     Right ankle: No swelling.     Left ankle: No swelling.  Lymphadenopathy:    She has no cervical adenopathy.  Neurological: She is alert and oriented to person, place, and time. No cranial nerve deficit.  Skin: Skin is warm. Nails show no clubbing.  Psoriasis rash bilateral lower extremities  Psychiatric: She has a normal mood and affect.      Data Reviewed: Basic Metabolic Panel: Recent Labs  Lab 07/11/19 1332 07/11/19 1840 07/12/19 0438  NA 140 141 138  K 3.6 3.7 3.5  CL 101 100 102  CO2 25 28 25   GLUCOSE 164* 138* 370*  BUN 32* 34* 36*  CREATININE 1.18* 1.33* 1.34*  CALCIUM 8.2* 8.5* 7.6*   Liver Function Tests: Recent Labs  Lab 07/11/19 1840  AST 22  ALT 15  ALKPHOS 81  BILITOT 0.7  PROT 8.0  ALBUMIN 3.9   CBC: Recent Labs  Lab 07/11/19 1332 07/12/19 0438  WBC 37.8* 24.3*  HGB 9.9* 8.4*  HCT 32.3* 27.2*  MCV 89.2 86.6  PLT 270 190     Recent Results (from the past 240 hour(s))  Blood Culture (routine x 2)  Status: None (Preliminary result)   Collection Time: 07/11/19  6:40 PM   Specimen: BLOOD  Result Value Ref Range Status   Specimen Description BLOOD BLOOD LEFT FOREARM  Final   Special Requests   Final    BOTTLES DRAWN AEROBIC AND ANAEROBIC Blood Culture adequate volume   Culture   Final    NO GROWTH < 24 HOURS Performed at [facility], [address], [place], [place] [address]    Report Status PENDING  Incomplete  Blood Culture (routine x 2)     Status: None (Preliminary result)   Collection Time: 07/11/19  6:41 PM   Specimen: BLOOD  Result Value Ref Range Status   Specimen Description BLOOD  Final   Special Requests NONE  Final   Culture    Final    NO GROWTH < 12 HOURS Performed at [facility], [address], [place], [place] [address]    Report Status PENDING  Incomplete  Respiratory Panel by RT PCR (Flu A&B, Covid) - Nasopharyngeal Swab     Status: None   Collection Time: 07/11/19  6:42 PM   Specimen: Nasopharyngeal Swab  Result Value Ref Range Status   SARS Coronavirus 2 by RT PCR NEGATIVE NEGATIVE Final    Comment: (NOTE) SARS-CoV-2 target nucleic acids are NOT DETECTED. The SARS-CoV-2 RNA is generally detectable in upper respiratoy specimens during the acute phase of infection. The lowest concentration of SARS-CoV-2 viral copies this assay can detect is 131 copies/mL. A negative result does not preclude SARS-Cov-2 infection and should not be used as the sole basis for treatment or other patient management decisions. A negative result may occur with  improper specimen collection/handling, submission of specimen other than nasopharyngeal swab, presence of viral mutation(s) within the areas targeted by this assay, and inadequate number of viral copies (<131 copies/mL). A negative result must be combined with clinical observations, patient history, and epidemiological information. The expected result is Negative. Fact Sheet for Patients:  PinkCheek.be Fact Sheet for Healthcare Providers:  GravelBags.it This test is not yet ap proved or cleared by the [place] FDA and  has been authorized for detection and/or diagnosis of SARS-CoV-2 by FDA under an Emergency Use Authorization (EUA). This EUA will remain  in effect (meaning this test can be used) for the duration of the COVID-19 declaration under Section 564(b)(1) of the Act, 21 U.S.C. section 360bbb-3(b)(1), unless the authorization is terminated or revoked sooner.    Influenza A by PCR NEGATIVE NEGATIVE Final   Influenza B by PCR NEGATIVE NEGATIVE Final    Comment: (NOTE) The Xpert Xpress  SARS-CoV-2/FLU/RSV assay is intended as an aid in  the diagnosis of influenza from Nasopharyngeal swab specimens and  should not be used as a sole basis for treatment. Nasal washings and  aspirates are unacceptable for Xpert Xpress SARS-CoV-2/FLU/RSV  testing. Fact Sheet for Patients: PinkCheek.be Fact Sheet for Healthcare Providers: GravelBags.it This test is not yet approved or cleared by the [place] FDA and  has been authorized for detection and/or diagnosis of SARS-CoV-2 by  FDA under an Emergency Use Authorization (EUA). This EUA will remain  in effect (meaning this test can be used) for the duration of the  Covid-19 declaration under Section 564(b)(1) of the Act, 21  U.S.C. section 360bbb-3(b)(1), unless the authorization is  terminated or revoked. Performed at [facility], [address] [place], [place] [address]      Studies: DG Chest 2 View  Result Date: 07/11/2019 CLINICAL  DATA:  Shortness of breath EXAM: CHEST - 2 VIEW COMPARISON:  02/07/2014 FINDINGS: Hyperexpanded lungs with flattening of the diaphragms and increased AP diameter of the chest. Coarsened interstitial markings bilaterally. Patchy bibasilar opacities. Heart size is stable. No pleural effusion. No pneumothorax. Exaggerated thoracic kyphosis. No acute osseous findings. IMPRESSION: 1. Patchy bibasilar opacities may represent atelectasis or infiltrate. 2. Advance emphysematous lung changes. Electronically Signed   By: Davina Poke D.O.   On: 07/11/2019 14:17    Scheduled Meds: . Bitoin Plus/Calcium/Vit D3  1 tablet Oral Daily  . clobetasol cream   Topical BID  . ferrous sulfate  325 mg Oral Daily  . ipratropium-albuterol  3 mL Nebulization Q6H  . loratadine  10 mg Oral Daily  . methylPREDNISolone (SOLU-MEDROL) injection  40 mg Intravenous Q12H  . pantoprazole  40 mg Oral Daily  . polyethylene glycol  17 g Oral Daily  .  pravastatin  40 mg Oral q1800  . sertraline  50 mg Oral Daily   Continuous Infusions: . sodium chloride 30 mL/hr at 07/12/19 1331  . azithromycin    . cefTRIAXone (ROCEPHIN)  IV Stopped (07/12/19 1062)    Assessment/Plan:  1. Acute on chronic hypoxic respiratory failure.  Patient was on 6 L when I saw her.  I dilated down to 4 L.  She chronically wears 3-1/2 L.  Continue to try to taper down oxygen to her usual level. 2. Lobar pneumonia, elevated procalcitonin and leukocytosis.  Patient placed on Rocephin and Zithromax. 3. COPD exacerbation on nebulizer treatments and steroids 4. Anemia.  Patient is guaiac positive which probably goes along with her chronic blood loss anemia.  Will send off iron studies to determine anemia type. 5. Elevated fibrin derivatives.  We will get an ultrasound of the lower extremity rule out DVT 6. Lactic acidosis.  Give gentle IV fluid hydration. 7. Depression on Zoloft 8. Hyperlipidemia unspecified on pravastatin 9. Constipation on MiraLAX and Colace 10. Weight loss.  Patient will need respiratory status to be better prior to this type of work-up. 11. Acute kidney injury.  Continue to monitor with IV fluids 12. Elevated blood sugar with steroids.  Check a hemoglobin A1c and put on sliding scale.  Code Status:     Code Status Orders  (From admission, onward)         Start     Ordered   07/11/19 2331  Full code  Continuous     07/11/19 2335        Code Status History    This patient has a current code status but no historical code status.   Advance Care Planning Activity     Family Communication: Spoke with son on the phone Disposition Plan: To be determined  Antibiotics:  Rocephin  Zithromax  Time spent: 32 minutes  [place]

## 2019-07-12 NOTE — H&P (Signed)
History and Physical    ROSHAWNA COLCLASURE PXT:[id number] DOB: 23-Jun-1931 DOA: 07/11/2019  PCP: Rusty Aus, MD  Patient coming from: home I have personally briefly reviewed patient's old medical records in [place]  Chief Complaint: SOB  HPI: CHARIZMA GARDINER is a 85 y.o. during impaired female with medical history significant for COPD on home oxygen at 3 L who lives independently who presented to the emergency room with a several day history of cough and worsening shortness of breath.  Dates that she had been on a course of steroids for a few days but her wheezing has not been responding to treatment at home.  She denies chest pain, nausea vomiting or diarrhea.  She denies Covid exposure.   ED Course: On arrival in the emergency room she was afebrile with a temperature of 98.6, blood pressure was 146/59, heart rate 121 respirations was 24 with O2 sat of 96% on 4 L.  White cell count was 37,000.  Lactic acid 1.1.  Creatinine 1.33, up from baseline of 0.96 1-year prior.  Procalcitonin 2.48 troponin 40.  Chest x-ray showed patchy bilateral opacities.  Patient was started on cefepime and azithromycin in the emergency room for treatment of community-acquired pneumonia and also treated with duo nebs and prednisolone for C OPD exacerbation and hospitalist consulted for admission. Review of Systems: As per HPI otherwise 10 point review of systems negative.    Past Medical History:  Diagnosis Date  . Asthma without status asthmaticus   . COPD, severe ([place])   . Disc disease, degenerative, lumbar or lumbosacral   . DVT (deep venous thrombosis) ([place]) 2005  . Fibrocystic breast disease   . Gastric antral vascular ectasia   . History of uterine cancer   . HTN (hypertension)   . Hyperlipidemia   . Osteoporosis     Past Surgical History:  Procedure Laterality Date  . APPENDECTOMY    . CHOLECYSTECTOMY       reports that she quit smoking about 30 years ago. Her smoking use included  cigarettes. She quit after 25.00 years of use. She has never used smokeless tobacco. No history on file for alcohol and drug.  Allergies  Allergen Reactions  . Codeine Nausea And Vomiting  . Penicillins Rash  . Sulfa Antibiotics Rash    Family History  Problem Relation Age of Onset  . Lung cancer Father      Prior to Admission medications   Medication Sig Start Date End Date Taking? Authorizing Provider  Black Pepper-Turmeric (TURMERIC COMPLEX/BLACK PEPPER) 3-500 MG CAPS Take 500 mg by mouth daily.   Yes [provider]  Cholecalciferol 25 MCG (1000 UT) tablet Take 2,000 Units by mouth daily.   Yes [provider]  clobetasol cream (TEMOVATE) 6.27 % Apply 1 application topically See admin instructions. Apply to itchy areas on legs as directed. 05/21/18  Yes [provider]  Coenzyme Q10 10 MG capsule Take 10 mg by mouth daily.   Yes [provider]  docusate sodium (COLACE) 50 MG capsule Take 50 mg by mouth daily.   Yes [provider]  EPINEPHrine 0.3 mg/0.3 mL IJ SOAJ injection  03/12/16  Yes [provider]  esomeprazole (NEXIUM) 20 MG capsule Take 20 mg by mouth daily at 12 noon.  04/23/16  Yes [provider]  ferrous sulfate 325 (65 FE) MG tablet Take 325 mg by mouth daily.   Yes [provider]  hydrocortisone (PROCTOSOL HC) 2.5 % rectal cream Place  1 application rectally See admin instructions. Apply to the affected areas three times a day as directed. 01/10/19  Yes [provider]  loratadine (CLARITIN) 10 MG tablet Take 10 mg by mouth daily.   Yes [provider]  lovastatin (MEVACOR) 40 MG tablet Take 40 mg by mouth at bedtime.  04/18/15  Yes [provider]  Multiple Vitamins-Minerals (BITOIN PLUS/CALCIUM/VIT D3) TABS Take 1 tablet by mouth daily.   Yes [provider]  sertraline (ZOLOFT) 50 MG tablet Take 50 mg by mouth daily.  04/18/15  Yes [provider]   traMADol (ULTRAM) 50 MG tablet Take 50 mg by mouth.  04/23/16  Yes [provider]  albuterol (PROAIR HFA) 108 (90 BASE) MCG/ACT inhaler Inhale 1-2 puffs into the lungs every 6 (six) hours as needed for wheezing or shortness of breath.  04/18/15   [provider]  ALPRAZolam Duanne Moron) 0.5 MG tablet Take 0.5 mg by mouth 3 (three) times daily as needed for anxiety.  10/09/14   [provider]  FeFum-FePo-FA-B Cmp-C-Zn-Mn-Cu (TANDEM PLUS) 162-115.2-1 MG CAPS Take 1 capsule by mouth daily. Patient not taking: Reported on 10/14/2017 06/19/17   Cammie Sickle, MD  HYDROcodone-acetaminophen (NORCO/VICODIN) 5-325 MG tablet Take 1 tablet by mouth every 6 (six) hours as needed for moderate pain.  03/12/15   [provider]  ipratropium-albuterol (DUONEB) 0.5-2.5 (3) MG/3ML SOLN PLACE 1 VIAL IN NEBULIZER AND INHALE 3 TIMES A DAY 04/08/16   [provider]  NON FORMULARY Vein/vascular vitamin-1 tablet once daily    [provider]  tiotropium (SPIRIVA HANDIHALER) 18 MCG inhalation capsule Take 1 Inhaler by mouth as needed for shortness of breath. 08/14/14   [provider]    Physical Exam: Vitals:   07/11/19 2330 07/11/19 2345 07/12/19 0000 07/12/19 0110  BP: (!) 125/57  (!) 128/51 (!) 150/62  Pulse: 99 94 97 (!) 104  Resp: (!) 28 (!) 35 (!) 37 (!) 22  Temp:      SpO2: 97% 100% 100% 96%  Weight:      Height:         Vitals:   07/11/19 2330 07/11/19 2345 07/12/19 0000 07/12/19 0110  BP: (!) 125/57  (!) 128/51 (!) 150/62  Pulse: 99 94 97 (!) 104  Resp: (!) 28 (!) 35 (!) 37 (!) 22  Temp:      SpO2: 97% 100% 100% 96%  Weight:      Height:        Constitutional: NAD, alert and oriented x 3,  Eyes: PERRL, lids and conjunctivae normal ENMT: Mucous membranes are moist. Hearing impaired Neck: normal, supple, no masses, no thyromegaly Respiratory: increased respiratory effort. Mild tachypnea, ronchi, crackles Cardiovascular: tachycardia,  no murmurs / rubs / gallops. No extremity edema. 2+ pedal pulses. No carotid bruits.  Abdomen: no tenderness, no masses palpated. No hepatosplenomegaly. Bowel sounds positive.  Musculoskeletal: no clubbing / cyanosis. No joint deformity upper and lower extremities.  Skin: no rashes, lesions, ulcers.  Neurologic: No gross focal neurologic deficit. Psychiatric: Normal mood and affect.   Labs on Admission: I have personally reviewed following labs and imaging studies  CBC: Recent Labs  Lab 07/11/19 1332  WBC 37.8*  HGB 9.9*  HCT 32.3*  MCV 89.2  PLT 474   Basic Metabolic Panel: Recent Labs  Lab 07/11/19 1332 07/11/19 1840  NA 140 141  K 3.6 3.7  CL 101 100  CO2 25 28  GLUCOSE 164* 138*  BUN 32* 34*  CREATININE 1.18* 1.33*  CALCIUM 8.2* 8.5*   GFR: Estimated Creatinine Clearance: 21.5 mL/min (A) (by C-G formula based on SCr of 1.33 mg/dL (H)). Liver Function Tests: Recent Labs  Lab 07/11/19 1840  AST 22  ALT 15  ALKPHOS 81  BILITOT 0.7  PROT 8.0  ALBUMIN 3.9   No results for input(s): LIPASE, AMYLASE in the last 168 hours. No results for input(s): AMMONIA in the last 168 hours. Coagulation Profile: No results for input(s): INR, PROTIME in the last 168 hours. Cardiac Enzymes: No results for input(s): CKTOTAL, CKMB, CKMBINDEX, TROPONINI in the last 168 hours. BNP (last 3 results) No results for input(s): PROBNP in the last 8760 hours. HbA1C: No results for input(s): HGBA1C in the last 72 hours. CBG: No results for input(s): GLUCAP in the last 168 hours. Lipid Profile: No results for input(s): CHOL, HDL, LDLCALC, TRIG, CHOLHDL, LDLDIRECT in the last 72 hours. Thyroid Function Tests: No results for input(s): TSH, T4TOTAL, FREET4, T3FREE, THYROIDAB in the last 72 hours. Anemia Panel: No results for input(s): VITAMINB12, FOLATE, FERRITIN, TIBC, IRON, RETICCTPCT in the last 72 hours. Urine analysis: No results found for: COLORURINE, APPEARANCEUR, LABSPEC,  [place], GLUCOSEU, HGBUR, BILIRUBINUR, KETONESUR, PROTEINUR, UROBILINOGEN, NITRITE, LEUKOCYTESUR  Radiological Exams on Admission: DG Chest 2 View  Result Date: 07/11/2019 CLINICAL DATA:  Shortness of breath EXAM: CHEST - 2 VIEW COMPARISON:  02/07/2014 FINDINGS: Hyperexpanded lungs with flattening of the diaphragms and increased AP diameter of the chest. Coarsened interstitial markings bilaterally. Patchy bibasilar opacities. Heart size is stable. No pleural effusion. No pneumothorax. Exaggerated thoracic kyphosis. No acute osseous findings. IMPRESSION: 1. Patchy bibasilar opacities may represent atelectasis or infiltrate. 2. Advance emphysematous lung changes. Electronically Signed   By: Davina Poke D.O.   On: 07/11/2019 14:17    EKG: Independently reviewed.  Assessment/Plan Active Problems:    Acute on chronic respiratory failure with hypoxia (HCC) --Patient at baseline wears O2 at 3 L now needing 4 L to maintain sats in the mid 90s --Etiology likely secondary to community acquired pneumonia in combination with acute bronchitis.  covid among the differential rapid test negative PCR pending --Supplemental oxygen as needed to keep sats over 92%  Community acquired pneumonia --suspect bacterial in view of elevated pro calcitonin of 2.48 --IV Rocephin and azithromycin --Follow blood cultures --Patient has leukocytosis of 37,000 but could be due in part to steroid treatment while at home  Elevated troponin --Likely related to demand ischemia --Sinus tachycardia on EKG without acute ST-T wave changes  AKI --Related to mild dehydration in the setting of acute infection --Gentle one time IV hydration and monitor renal function  COPD with acute exacerbation (HCC) --IV prednisone to transition to prednisone --Duo nebs scheduled with albuterol as needed    DVT prophylaxis: lovenox  Code Status: DNR  Disposition Plan: Back to previous home environment Consults called:  none    Athena Masse MD [facility]     07/12/2019, 1:23 AM

## 2019-07-12 NOTE — ED Notes (Addendum)
ED TO INPATIENT HANDOFF REPORT  ED Nurse Name and Phone #: Karena Addison [phone]  S Name/Age/Gender Margaret [name] 85 y.o. female Room/Bed: ED34A/ED34A  Code Status   Code Status: Full Code  Home/SNF/Other Home Patient oriented to: self, place, time and situation Is this baseline? Yes   Triage Complete: Triage complete  Chief Complaint CAP (community acquired pneumonia) [J18.9]  Triage Note Pt reportigng increased SOB, sore throat and weakness for over a week. Slight cough but no fever. Pt has been on 4L of oxygen for multiple years but the SOB has worsened per pt. Pt not able to speak in complete sentences in triage and appears very uncomfortable leaning over and hold her head. Increased WOB noted at this time.     Allergies Allergies  Allergen Reactions  . Codeine Nausea And Vomiting  . Penicillins Rash  . Sulfa Antibiotics Rash    Level of Care/Admitting Diagnosis ED Disposition    ED Disposition Condition Comment   Admit  Hospital Area: [place] [[id number]]  Level of Care: Med-Surg [16]  Covid Evaluation: Symptomatic Person Under Investigation (PUI)  Diagnosis: CAP (community acquired pneumonia) [734193]  Admitting Physician: Athena Masse [[id number]]  Attending Physician: Athena Masse [[id number]]  Estimated length of stay: 3 - 4 days  Certification:: I certify this patient will need inpatient services for at least 2 midnights       B Medical/Surgery History Past Medical History:  Diagnosis Date  . Asthma without status asthmaticus   . COPD, severe ([place])   . Disc disease, degenerative, lumbar or lumbosacral   . DVT (deep venous thrombosis) ([place]) 2005  . Fibrocystic breast disease   . Gastric antral vascular ectasia   . History of uterine cancer   . HTN (hypertension)   . Hyperlipidemia   . Osteoporosis    Past Surgical History:  Procedure Laterality Date  . APPENDECTOMY    . CHOLECYSTECTOMY       A IV  Location/Drains/Wounds Patient Lines/Drains/Airways Status   Active Line/Drains/Airways    Name:   Placement date:   Placement time:   Site:   Days:   Peripheral IV 07/11/19 Left Forearm   07/11/19    1850    Forearm   1   Peripheral IV 07/11/19 Right Forearm   07/11/19    1925    Forearm   1          Intake/Output Last 24 hours  Intake/Output Summary (Last 24 hours) at 07/12/2019 0529 Last data filed at 07/12/2019 0005 Gross per 24 hour  Intake 500 ml  Output --  Net 500 ml    Labs/Imaging Results for orders placed or performed during the hospital encounter of 07/11/19 (from the past 48 hour(s))  Basic metabolic panel     Status: Abnormal   Collection Time: 07/11/19  1:32 PM  Result Value Ref Range   Sodium 140 135 - 145 mmol/L   Potassium 3.6 3.5 - 5.1 mmol/L   Chloride 101 98 - 111 mmol/L   CO2 25 22 - 32 mmol/L   Glucose, Bld 164 (H) 70 - 99 mg/dL   BUN 32 (H) 8 - 23 mg/dL   Creatinine, Ser 1.18 (H) 0.44 - 1.00 mg/dL   Calcium 8.2 (L) 8.9 - 10.3 mg/dL   GFR calc non Af Amer 42 (L) >60 mL/min   GFR calc Af Amer 48 (L) >60 mL/min   Anion gap 14 5 - 15    Comment: Performed at  [facility], [address] [place], [place] [address]  CBC     Status: Abnormal   Collection Time: 07/11/19  1:32 PM  Result Value Ref Range   WBC 37.8 (H) 4.0 - 10.5 K/uL   RBC 3.62 (L) 3.87 - 5.11 MIL/uL   Hemoglobin 9.9 (L) 12.0 - 15.0 g/dL   HCT 32.3 (L) 36.0 - 46.0 %   MCV 89.2 80.0 - 100.0 fL   MCH 27.3 26.0 - 34.0 pg   MCHC 30.7 30.0 - 36.0 g/dL   RDW 17.7 (H) 11.5 - 15.5 %   Platelets 270 150 - 400 K/uL   nRBC 0.0 0.0 - 0.2 %    Comment: Performed at [facility], [address] [place], [place] [address]  Troponin I (High Sensitivity)     Status: Abnormal   Collection Time: 07/11/19  1:32 PM  Result Value Ref Range   Troponin I (High Sensitivity) 33 (H) <18 ng/L    Comment: (NOTE) Elevated high sensitivity troponin I (hsTnI) values and significant   changes across serial measurements may suggest ACS but many other  chronic and acute conditions are known to elevate hsTnI results.  Refer to the "Links" section for chest pain algorithms and additional  guidance. Performed at [facility], [place], [place] [address]   Troponin I (High Sensitivity)     Status: Abnormal   Collection Time: 07/11/19  4:11 PM  Result Value Ref Range   Troponin I (High Sensitivity) 40 (H) <18 ng/L    Comment: (NOTE) Elevated high sensitivity troponin I (hsTnI) values and significant  changes across serial measurements may suggest ACS but many other  chronic and acute conditions are known to elevate hsTnI results.  Refer to the "Links" section for chest pain algorithms and additional  guidance. Performed at [facility], [address] [place], [place] [address]   Lactic acid, plasma     Status: None   Collection Time: 07/11/19  6:39 PM  Result Value Ref Range   Lactic Acid, Venous 1.1 0.5 - 1.9 mmol/L    Comment: Performed at [facility], [address] [place], [place] [address]  Comprehensive metabolic panel     Status: Abnormal   Collection Time: 07/11/19  6:40 PM  Result Value Ref Range   Sodium 141 135 - 145 mmol/L   Potassium 3.7 3.5 - 5.1 mmol/L   Chloride 100 98 - 111 mmol/L   CO2 28 22 - 32 mmol/L   Glucose, Bld 138 (H) 70 - 99 mg/dL   BUN 34 (H) 8 - 23 mg/dL   Creatinine, Ser 1.33 (H) 0.44 - 1.00 mg/dL   Calcium 8.5 (L) 8.9 - 10.3 mg/dL   Total Protein 8.0 6.5 - 8.1 g/dL   Albumin 3.9 3.5 - 5.0 g/dL   AST 22 15 - 41 U/L   ALT 15 0 - 44 U/L   Alkaline Phosphatase 81 38 - 126 U/L   Total Bilirubin 0.7 0.3 - 1.2 mg/dL   GFR calc non Af Amer 36 (L) >60 mL/min   GFR calc Af Amer 42 (L) >60 mL/min   Anion gap 13 5 - 15    Comment: Performed at [facility], [address], [place], [place] [address]  Procalcitonin     Status: None   Collection Time: 07/11/19  6:40 PM  Result  Value Ref Range   Procalcitonin 2.48 ng/mL    Comment:  Interpretation: PCT > 2 ng/mL: Systemic infection (sepsis) is likely, unless other causes are known. (NOTE)       Sepsis PCT Algorithm           Lower Respiratory Tract                                      Infection PCT Algorithm    ----------------------------     ----------------------------         PCT < 0.25 ng/mL                PCT < 0.10 ng/mL         Strongly encourage             Strongly discourage   discontinuation of antibiotics    initiation of antibiotics    ----------------------------     -----------------------------       PCT 0.25 - 0.50 ng/mL            PCT 0.10 - 0.25 ng/mL               OR       >80% decrease in PCT            Discourage initiation of                                            antibiotics      Encourage discontinuation           of antibiotics    ----------------------------     -----------------------------         PCT >= 0.50 ng/mL              PCT 0.26 - 0.50 ng/mL               AND       <80% decrease in PCT              Encourage initiation of                                             antibiotics       Encourage continuation           of antibiotics    ----------------------------     -----------------------------        PCT >= 0.50 ng/mL                  PCT > 0.50 ng/mL               AND         increase in PCT                  Strongly encourage                                      initiation of antibiotics    Strongly encourage escalation           of antibiotics                                     -----------------------------  PCT <= 0.25 ng/mL                                                 OR                                        > 80% decrease in PCT                                     Discontinue / Do not initiate                                             antibiotics Performed at [facility], [address] [place], [place] [address]   Sample to Blood Bank     Status: None   Collection Time: 07/11/19  6:42 PM  Result Value Ref Range   Blood Bank Specimen SAMPLE AVAILABLE FOR TESTING    Sample Expiration      07/14/2019,2359 Performed at [facility], [address] [place], [place] [address]   Respiratory Panel by RT PCR (Flu A&B, Covid) - Nasopharyngeal Swab     Status: None   Collection Time: 07/11/19  6:42 PM   Specimen: Nasopharyngeal Swab  Result Value Ref Range   SARS Coronavirus 2 by RT PCR NEGATIVE NEGATIVE    Comment: (NOTE) SARS-CoV-2 target nucleic acids are NOT DETECTED. The SARS-CoV-2 RNA is generally detectable in upper respiratoy specimens during the acute phase of infection. The lowest concentration of SARS-CoV-2 viral copies this assay can detect is 131 copies/mL. A negative result does not preclude SARS-Cov-2 infection and should not be used as the sole basis for treatment or other patient management decisions. A negative result may occur with  improper specimen collection/handling, submission of specimen other than nasopharyngeal swab, presence of viral mutation(s) within the areas targeted by this assay, and inadequate number of viral copies (<131 copies/mL). A negative result must be combined with clinical observations, patient history, and epidemiological information. The expected result is Negative. Fact Sheet for Patients:  PinkCheek.be Fact Sheet for Healthcare Providers:  GravelBags.it This test is not yet ap proved or cleared by the [place] FDA and  has been authorized for detection and/or diagnosis of SARS-CoV-2 by FDA under an Emergency Use Authorization (EUA). This EUA will remain  in effect (meaning this test can be used) for the duration of the COVID-19 declaration under Section 564(b)(1) of the Act, 21 U.S.C. section 360bbb-3(b)(1), unless the authorization is terminated  or revoked sooner.    Influenza A by PCR NEGATIVE NEGATIVE   Influenza B by PCR NEGATIVE NEGATIVE    Comment: (NOTE) The Xpert Xpress SARS-CoV-2/FLU/RSV assay is intended as an aid in  the diagnosis of influenza from Nasopharyngeal swab specimens and  should not be used as a sole basis for treatment. Nasal washings and  aspirates are unacceptable for Xpert Xpress SARS-CoV-2/FLU/RSV  testing. Fact Sheet for Patients: PinkCheek.be Fact Sheet for Healthcare Providers: GravelBags.it This test is not yet approved or cleared by the [place] FDA and  has  been authorized for detection and/or diagnosis of SARS-CoV-2 by  FDA under an Emergency Use Authorization (EUA). This EUA will remain  in effect (meaning this test can be used) for the duration of the  Covid-19 declaration under Section 564(b)(1) of the Act, 21  U.S.C. section 360bbb-3(b)(1), unless the authorization is  terminated or revoked. Performed at [facility], [address] [place], [place] [address]   Urinalysis, Complete w Microscopic     Status: Abnormal   Collection Time: 07/12/19  1:59 AM  Result Value Ref Range   Color, Urine YELLOW (A) YELLOW   APPearance CLOUDY (A) CLEAR   Specific Gravity, Urine 1.023 1.005 - 1.030   pH 5.0 5.0 - 8.0   Glucose, UA NEGATIVE NEGATIVE mg/dL   Hgb urine dipstick NEGATIVE NEGATIVE   Bilirubin Urine NEGATIVE NEGATIVE   Ketones, ur NEGATIVE NEGATIVE mg/dL   Protein, ur 100 (A) NEGATIVE mg/dL   Nitrite NEGATIVE NEGATIVE   Leukocytes,Ua SMALL (A) NEGATIVE   RBC / HPF 11-20 0 - 5 RBC/hpf   WBC, UA 11-20 0 - 5 WBC/hpf   Bacteria, UA NONE SEEN NONE SEEN   Squamous Epithelial / LPF NONE SEEN 0 - 5   Mucus PRESENT    Cellular Cast, UA PRESENT     Comment: Performed at [facility], [address] [place], [place] [address]  Basic metabolic panel     Status: Abnormal   Collection Time: 07/12/19  4:38 AM   Result Value Ref Range   Sodium 138 135 - 145 mmol/L   Potassium 3.5 3.5 - 5.1 mmol/L   Chloride 102 98 - 111 mmol/L   CO2 25 22 - 32 mmol/L   Glucose, Bld 370 (H) 70 - 99 mg/dL   BUN 36 (H) 8 - 23 mg/dL   Creatinine, Ser 1.34 (H) 0.44 - 1.00 mg/dL   Calcium 7.6 (L) 8.9 - 10.3 mg/dL   GFR calc non Af Amer 36 (L) >60 mL/min   GFR calc Af Amer 41 (L) >60 mL/min   Anion gap 11 5 - 15    Comment: Performed at [facility], [address] [place], [place] [address]  CBC     Status: Abnormal   Collection Time: 07/12/19  4:38 AM  Result Value Ref Range   WBC 24.3 (H) 4.0 - 10.5 K/uL   RBC 3.14 (L) 3.87 - 5.11 MIL/uL   Hemoglobin 8.4 (L) 12.0 - 15.0 g/dL   HCT 27.2 (L) 36.0 - 46.0 %   MCV 86.6 80.0 - 100.0 fL   MCH 26.8 26.0 - 34.0 pg   MCHC 30.9 30.0 - 36.0 g/dL   RDW 18.0 (H) 11.5 - 15.5 %   Platelets 190 150 - 400 K/uL   nRBC 0.0 0.0 - 0.2 %    Comment: Performed at [facility], [address] [place],  [address]   DG Chest 2 View  Result Date: 07/11/2019 CLINICAL DATA:  Shortness of breath EXAM: CHEST - 2 VIEW COMPARISON:  02/07/2014 FINDINGS: Hyperexpanded lungs with flattening of the diaphragms and increased AP diameter of the chest. Coarsened interstitial markings bilaterally. Patchy bibasilar opacities. Heart size is stable. No pleural effusion. No pneumothorax. Exaggerated thoracic kyphosis. No acute osseous findings. IMPRESSION: 1. Patchy bibasilar opacities may represent atelectasis or infiltrate. 2. Advance emphysematous lung changes. Electronically Signed   By: Davina Poke D.O.   On: 07/11/2019 14:17    Pending Labs Unresulted Labs (From admission, onward)  Start     Ordered   07/12/19 0500  Fibrin derivatives D-Dimer ([place] only)  Daily,   STAT     07/11/19 2335   07/12/19 0500  C-reactive protein  Daily,   STAT     07/11/19 2335   07/12/19 0500  ABO/Rh  Once,   STAT     07/12/19 0125   07/12/19 0500  HIV Antibody (routine testing  w rflx)  (HIV Antibody (Routine testing w reflex) panel)  Once,   STAT     07/12/19 0126   07/11/19 1807  Lactic acid, plasma  STAT Now then every 3 hours,   STAT     07/11/19 1807   07/11/19 1807  Blood Culture (routine x 2)  BLOOD CULTURE X 2,   STAT     07/11/19 1807   07/11/19 1807  Urine culture  ONCE - STAT,   STAT     07/11/19 1807          Vitals/Pain Today's Vitals   07/12/19 0233 07/12/19 0330 07/12/19 0350 07/12/19 0430  BP: 101/77 135/79  (!) 128/53  Pulse: (!) 126 (!) 109  (!) 107  Resp: 17 (!) 28  (!) 28  Temp:      SpO2: (!) 88% 99%  96%  Weight:      Height:      PainSc: 0-No pain Asleep Asleep     Isolation Precautions No active isolations  Medications Medications  0.45 % sodium chloride infusion ( Intravenous New Bag/Given 07/12/19 0112)  cefTRIAXone (ROCEPHIN) 2 g in sodium chloride 0.9 % 100 mL IVPB (2 g Intravenous New Bag/Given 07/12/19 0503)  azithromycin (ZITHROMAX) 500 mg in sodium chloride 0.9 % 250 mL IVPB (has no administration in time range)  ipratropium-albuterol (DUONEB) 0.5-2.5 (3) MG/3ML nebulizer solution 3 mL (3 mLs Nebulization Given 07/12/19 0245)  albuterol (PROVENTIL) (2.5 MG/3ML) 0.083% nebulizer solution 2.5 mg (2.5 mg Nebulization Given 07/12/19 0458)  guaiFENesin-dextromethorphan (ROBITUSSIN DM) 100-10 MG/5ML syrup 10 mL (10 mLs Oral Given 07/12/19 0458)  methylPREDNISolone sodium succinate (SOLU-MEDROL) 40 mg/mL injection 40 mg (has no administration in time range)    Followed by  predniSONE (DELTASONE) tablet 40 mg (has no administration in time range)  ceFEPIme (MAXIPIME) 2 g in sodium chloride 0.9 % 100 mL IVPB (0 g Intravenous Stopped 07/11/19 2007)  azithromycin (ZITHROMAX) 500 mg in sodium chloride 0.9 % 250 mL IVPB (0 mg Intravenous Stopped 07/11/19 2005)  ipratropium-albuterol (DUONEB) 0.5-2.5 (3) MG/3ML nebulizer solution 3 mL (3 mLs Nebulization Given 07/11/19 2140)  ipratropium-albuterol (DUONEB) 0.5-2.5 (3) MG/3ML  nebulizer solution 3 mL (3 mLs Nebulization Given 07/11/19 2145)  sodium chloride 0.9 % bolus 500 mL (0 mLs Intravenous Stopped 07/12/19 0005)  methylPREDNISolone sodium succinate (SOLU-MEDROL) 125 mg/2 mL injection 125 mg (125 mg Intravenous Given 07/12/19 0107)  ipratropium-albuterol (DUONEB) 0.5-2.5 (3) MG/3ML nebulizer solution 3 mL (3 mLs Nebulization Given 07/12/19 0108)  albuterol (PROVENTIL) (2.5 MG/3ML) 0.083% nebulizer solution 2.5 mg (2.5 mg Nebulization Given 07/12/19 0108)    Mobility walks with person assist Moderate fall risk   Focused Assessments    R Recommendations: See Admitting Provider Note  Report given to: Francena Hanly, RN

## 2019-07-13 ENCOUNTER — Inpatient Hospital Stay: Payer: Medicare Other

## 2019-07-13 ENCOUNTER — Encounter: Payer: Self-pay | Admitting: Internal Medicine

## 2019-07-13 DIAGNOSIS — N189 Chronic kidney disease, unspecified: Secondary | ICD-10-CM

## 2019-07-13 DIAGNOSIS — R7301 Impaired fasting glucose: Secondary | ICD-10-CM

## 2019-07-13 DIAGNOSIS — D500 Iron deficiency anemia secondary to blood loss (chronic): Secondary | ICD-10-CM

## 2019-07-13 LAB — CBC
HCT: 25 % — ABNORMAL LOW (ref 36.0–46.0)
Hemoglobin: 7.9 g/dL — ABNORMAL LOW (ref 12.0–15.0)
MCH: 27.1 pg (ref 26.0–34.0)
MCHC: 31.6 g/dL (ref 30.0–36.0)
MCV: 85.6 fL (ref 80.0–100.0)
Platelets: 222 10*3/uL (ref 150–400)
RBC: 2.92 MIL/uL — ABNORMAL LOW (ref 3.87–5.11)
RDW: 17.7 % — ABNORMAL HIGH (ref 11.5–15.5)
WBC: 16.5 10*3/uL — ABNORMAL HIGH (ref 4.0–10.5)
nRBC: 0.1 % (ref 0.0–0.2)

## 2019-07-13 LAB — BASIC METABOLIC PANEL
Anion gap: 10 (ref 5–15)
BUN: 38 mg/dL — ABNORMAL HIGH (ref 8–23)
CO2: 26 mmol/L (ref 22–32)
Calcium: 8 mg/dL — ABNORMAL LOW (ref 8.9–10.3)
Chloride: 106 mmol/L (ref 98–111)
Creatinine, Ser: 1.25 mg/dL — ABNORMAL HIGH (ref 0.44–1.00)
GFR calc Af Amer: 45 mL/min — ABNORMAL LOW (ref >60)
GFR calc non Af Amer: 39 mL/min — ABNORMAL LOW (ref >60)
Glucose, Bld: 203 mg/dL — ABNORMAL HIGH (ref 70–99)
Potassium: 3.5 mmol/L (ref 3.5–5.1)
Sodium: 142 mmol/L (ref 135–145)

## 2019-07-13 LAB — GLUCOSE, CAPILLARY
Glucose-Capillary: 123 mg/dL — ABNORMAL HIGH (ref 70–99)
Glucose-Capillary: 165 mg/dL — ABNORMAL HIGH (ref 70–99)
Glucose-Capillary: 163 mg/dL — ABNORMAL HIGH (ref 70–99)
Glucose-Capillary: 187 mg/dL — ABNORMAL HIGH (ref 70–99)

## 2019-07-13 LAB — FERRITIN: Ferritin: 116 ng/mL (ref 11–307)

## 2019-07-13 LAB — IRON AND TIBC
Iron: 24 ug/dL — ABNORMAL LOW (ref 28–170)
Saturation Ratios: 9 % — ABNORMAL LOW (ref 10.4–31.8)
TIBC: 278 ug/dL (ref 250–450)
UIBC: 254 ug/dL

## 2019-07-13 LAB — VITAMIN B12: Vitamin B-12: 685 pg/mL (ref 180–914)

## 2019-07-13 MED ORDER — IOHEXOL 350 MG/ML SOLN
75.0000 mL | Freq: Once | INTRAVENOUS | Status: AC | PRN
  Administered 2019-07-13: 75 mL via INTRAVENOUS

## 2019-07-13 MED ORDER — IPRATROPIUM-ALBUTEROL 0.5-2.5 (3) MG/3ML IN SOLN
3.0000 mL | Freq: Three times a day (TID) | RESPIRATORY_TRACT | Status: DC
  Administered 2019-07-14 – 2019-07-15 (×4): 3 mL via RESPIRATORY_TRACT
  Filled 2019-07-13 (×5): qty 3

## 2019-07-13 MED ORDER — IOHEXOL 9 MG/ML PO SOLN
500.0000 mL | ORAL | Status: AC
  Administered 2019-07-13 (×2): 500 mL via ORAL

## 2019-07-13 NOTE — Progress Notes (Signed)
Physical Therapy Treatment Patient Details Name: Margaret [name] MRN: [id number] DOB: 17-Aug-1930 Today's Date: 07/13/2019    History of Present Illness Patient is an 85 year old female admitted reports of weakness, sore throad and increased SOB diagnosed wiith community aquired PNA. Home O2 3-4 lpm, PMH includes: COPD. Per testing, pt negative for bilat DVTs.    PT Comments    Pt is making good progress towards goals with increased ambulation distance noted this date. 2 bouts of 120' performed with HR increased to 125bpm and O2 at 95% on 4L of O2. Improved safety noted with RW used. Demonstration provided prior to use. Pt would benefit from RW at all times. Agreeable to sit in recliner at end of session. Will continue to progress as able.   Follow Up Recommendations  Home health PT     Equipment Recommendations  Rolling walker with 5" wheels    Recommendations for Other Services       Precautions / Restrictions Precautions Precautions: Fall Restrictions Weight Bearing Restrictions: No    Mobility  Bed Mobility Overal bed mobility: Independent             General bed mobility comments: safe technique without assist required. Once seated, upright posture noted  Transfers Overall transfer level: Needs assistance Equipment used: Rolling walker (2 wheeled) Transfers: Sit to/from Stand Sit to Stand: Min guard         General transfer comment: safe technique with pushing from seated surface. Once standing, safe technique performed  Ambulation/Gait Ambulation/Gait assistance: Min guard Gait Distance (Feet): 120 Feet Assistive device: Rolling walker (2 wheeled) Gait Pattern/deviations: Step-through pattern;Trunk flexed;Narrow base of support;Decreased stride length     General Gait Details: ambulated 2 bouts of 120' with RW and reciprocal gait pattern. Cues for safety and 1 seated rest break noted with O2 sats at 94%. All mobility performed on 4L of O2.   Stairs             Wheelchair Mobility    Modified Rankin (Stroke Patients Only)       Balance Overall balance assessment: Needs assistance Sitting-balance support: Feet supported Sitting balance-Leahy Scale: Good     Standing balance support: Bilateral upper extremity supported Standing balance-Leahy Scale: Good Standing balance comment: on RW                            Cognition Arousal/Alertness: Awake/alert Behavior During Therapy: WFL for tasks assessed/performed Overall Cognitive Status: Within Functional Limits for tasks assessed                                        Exercises Other Exercises Other Exercises: Supine ther-ex performed on B LE including AP, SLR, hip abd/add, and heel slides. All ther-ex performed on 10 reps with supervision.     General Comments        Pertinent Vitals/Pain Pain Assessment: No/denies pain    Home Living                      Prior Function            PT Goals (current goals can now be found in the care plan section) Acute Rehab PT Goals Patient Stated Goal: to feel better, go home PT Goal Formulation: With patient Time For Goal Achievement: 07/26/19 Potential to Achieve Goals: Good  Progress towards PT goals: Progressing toward goals    Frequency    Min 2X/week      PT Plan Current plan remains appropriate    Co-evaluation              AM-PAC PT "6 Clicks" Mobility   Outcome Measure  Help needed turning from your back to your side while in a flat bed without using bedrails?: None Help needed moving from lying on your back to sitting on the side of a flat bed without using bedrails?: None Help needed moving to and from a bed to a chair (including a wheelchair)?: A Little Help needed standing up from a chair using your arms (e.g., wheelchair or bedside chair)?: A Little Help needed to walk in hospital room?: A Little Help needed climbing 3-5 steps with a railing? : A Lot 6  Click Score: 19    End of Session Equipment Utilized During Treatment: Gait belt;Oxygen Activity Tolerance: Patient tolerated treatment well Patient left: in chair;with chair alarm set Nurse Communication: Mobility status PT Visit Diagnosis: Unsteadiness on feet (R26.81);Muscle weakness (generalized) (M62.81);Difficulty in walking, not elsewhere classified (R26.2)     Time: 3159-4585 PT Time Calculation (min) (ACUTE ONLY): 40 min  Charges:  $Gait Training: 23-37 mins $Therapeutic Exercise: 8-22 mins                     Greggory Stallion, PT, DPT [phone]    Margaret [name] 07/13/2019, 10:27 AM

## 2019-07-13 NOTE — TOC Initial Note (Signed)
Transition of Care [facility]) - Initial/Assessment Note    Patient Details  Name: Margaret [name] MRN: [id number] Date of Birth: 01-02-1931  Transition of Care [facility] CM/SW Contact:    Shelbie Ammons, RN Phone Number: 07/13/2019, 2:07 PM  Clinical Narrative:        RNCM for initial assessment made at bedside, patient sitting up in bed dosing with tv on. Patient with no signs of distress and after introductions talking appropriately however noted to be hard of hearing. Patient is admitted to hospital due to Pneumonia which she reports having several times in the past. Patient reports she is chronically on oxygen however she has required more since being in hospital. Patient verbalizes she thinks it would be very useful to have home health services in the home. She has had services in the past with her husband but can't remember who provided them.  Reached out to [facility], [facility], and [facility] they are all unable to accept patient. Will continue to search for agencies. Discussed need for rolling walker with patient and she reports she already has one at home. Patient for abdominal CT this afternoon.         Expected Discharge Plan: [place] Barriers to Discharge: Continued Medical Work up   Patient Goals and CMS Choice Patient states their goals for this hospitalization and ongoing recovery are:: to get stronger so I will be ok at home.   Choice offered to / list presented to : Patient  Expected Discharge Plan and Services Expected Discharge Plan: [place] Acute Care Choice: [place] arrangements for the past 2 months: Single Family Home                                      Prior Living Arrangements/Services Living arrangements for the past 2 months: Single Family Home Lives with:: Self Patient language and need for interpreter reviewed:: Yes Do you feel safe going back to the place where you live?: Yes      Need for  Family Participation in Patient Care: Yes (Comment) Care giver support system in place?: Yes (comment)   Criminal Activity/Legal Involvement Pertinent to Current Situation/Hospitalization: No - Comment as needed  Activities of Daily Living Home Assistive Devices/Equipment: Eyeglasses ADL Screening (condition at time of admission) Patient's cognitive ability adequate to safely complete daily activities?: Yes Is the patient deaf or have difficulty hearing?: Yes Does the patient have difficulty seeing, even when wearing glasses/contacts?: No Does the patient have difficulty concentrating, remembering, or making decisions?: No Patient able to express need for assistance with ADLs?: Yes Does the patient have difficulty dressing or bathing?: No Independently performs ADLs?: Yes (appropriate for developmental age) Does the patient have difficulty walking or climbing stairs?: Yes Weakness of Legs: Both Weakness of Arms/Hands: Both  Permission Sought/Granted                  Emotional Assessment Appearance:: Appears stated age Attitude/Demeanor/Rapport: Engaged Affect (typically observed): Appropriate, Calm Orientation: : Oriented to Self, Oriented to Place, Oriented to  Time Alcohol / Substance Use: Never Used Psych Involvement: No (comment)  Admission diagnosis:  Shortness of breath [R06.02] CAP (community acquired pneumonia) [J18.9] Pneumonia due to infectious organism, unspecified laterality, unspecified part of lung [J18.9] Patient Active Problem List   Diagnosis Date Noted  . Impaired fasting glucose   .  Lobar pneumonia ([place])   . Anemia   . Lactic acidosis   . Weight loss   . Acute kidney injury superimposed on CKD ([place])   . Community acquired pneumonia 07/11/2019  . Acute on chronic respiratory failure with hypoxia ([place]) 07/11/2019  . COPD with acute exacerbation ([place]) 07/11/2019  . Suspected COVID-19 virus infection 07/11/2019  . CAP (community acquired pneumonia)  07/11/2019  . Asthma without status asthmaticus 06/01/2015  . Bloodgood disease 06/01/2015  . Personal history of malignant neoplasm of other parts of uterus 06/01/2015  . Iron deficiency anemia due to chronic blood loss 01/02/2015  . Degeneration of intervertebral disc of lumbosacral region 04/13/2014  . Severe chronic obstructive pulmonary disease ([place]) 11/11/2013  . Deep vein thrombosis (DVT) ([place]) 11/11/2013  . Gastric antral vascular ectasia 11/11/2013  . BP (high blood pressure) 11/11/2013  . HLD (hyperlipidemia) 11/11/2013  . OP (osteoporosis) 11/11/2013   PCP:  Rusty Aus, MD Pharmacy:   [place], [place] - [address] [address] Phone: [phone] Fax: [phone]     Social Determinants of Health (SDOH) Interventions    Readmission Risk Interventions No flowsheet data found.

## 2019-07-13 NOTE — Progress Notes (Signed)
Patient ID: Margaret [name], female   DOB: 02-Oct-1930, 85 y.o.   MRN: [id number] [facility] PROGRESS NOTE  LARYSA PALL OZH:[id number] DOB: 05/20/1931 DOA: 07/11/2019 PCP: Rusty Aus, MD  HPI/Subjective: Patient feeling a little bit better today.  Some cough and shortness of breath but feels like she is breathing better today than yesterday.  Feels weak.  Concerned about low blood counts.  She states that she had a capsule endoscopy and colonoscopy recently but I do not see the results in the computer.  Objective: Vitals:   07/13/19 0755 07/13/19 0758  BP: (!) 178/88   Pulse: (!) 103   Resp: 17   Temp: 98.5 F (36.9 C)   SpO2: 98% 98%    Intake/Output Summary (Last 24 hours) at 07/13/2019 1258 Last data filed at 07/13/2019 0900 Gross per 24 hour  Intake 1352.37 ml  Output --  Net 1352.37 ml   Filed Weights   07/11/19 1331  Weight: 44.9 kg    ROS: Review of Systems  Constitutional: Positive for malaise/fatigue. Negative for chills and fever.  HENT: Positive for sore throat.   Eyes: Negative for blurred vision.  Respiratory: Positive for cough, shortness of breath and wheezing.   Cardiovascular: Negative for chest pain.  Gastrointestinal: Negative for abdominal pain, constipation, diarrhea, nausea and vomiting.  Genitourinary: Negative for dysuria.  Musculoskeletal: Positive for joint pain.  Neurological: Negative for dizziness and headaches.   Exam: Physical Exam  Constitutional: She is oriented to person, place, and time.  HENT:  Nose: No mucosal edema.  Mouth/Throat: No oropharyngeal exudate or posterior oropharyngeal edema.  Eyes: Pupils are equal, round, and reactive to light. Conjunctivae, EOM and lids are normal.  Neck: Carotid bruit is not present.  Cardiovascular: S1 normal and S2 normal. Exam reveals no gallop.  No murmur heard. Pulses:      Dorsalis pedis pulses are 2+ on the right side and 2+ on the left side.  Respiratory: No respiratory  distress. She has decreased breath sounds in the right lower field and the left lower field. She has wheezes in the right lower field and the left lower field. She has no rhonchi. She has no rales.  GI: Soft. Bowel sounds are normal. There is no abdominal tenderness.  Musculoskeletal:     Right ankle: No swelling.     Left ankle: No swelling.  Lymphadenopathy:    She has no cervical adenopathy.  Neurological: She is alert and oriented to person, place, and time. No cranial nerve deficit.  Skin: Skin is warm. Nails show no clubbing.  Psoriasis rash bilateral lower extremities  Psychiatric: She has a normal mood and affect.      Data Reviewed: Basic Metabolic Panel: Recent Labs  Lab 07/11/19 1332 07/11/19 1840 07/12/19 0438 07/13/19 0620  NA 140 141 138 142  K 3.6 3.7 3.5 3.5  CL 101 100 102 106  CO2 25 28 25 26   GLUCOSE 164* 138* 370* 203*  BUN 32* 34* 36* 38*  CREATININE 1.18* 1.33* 1.34* 1.25*  CALCIUM 8.2* 8.5* 7.6* 8.0*   Liver Function Tests: Recent Labs  Lab 07/11/19 1840  AST 22  ALT 15  ALKPHOS 81  BILITOT 0.7  PROT 8.0  ALBUMIN 3.9   CBC: Recent Labs  Lab 07/11/19 1332 07/12/19 0438 07/13/19 0620  WBC 37.8* 24.3* 16.5*  HGB 9.9* 8.4* 7.9*  HCT 32.3* 27.2* 25.0*  MCV 89.2 86.6 85.6  PLT 270 190 222     Recent Results (  from the past 240 hour(s))  Blood Culture (routine x 2)     Status: None (Preliminary result)   Collection Time: 07/11/19  6:40 PM   Specimen: BLOOD  Result Value Ref Range Status   Specimen Description BLOOD BLOOD LEFT FOREARM  Final   Special Requests   Final    BOTTLES DRAWN AEROBIC AND ANAEROBIC Blood Culture adequate volume   Culture   Final    NO GROWTH < 24 HOURS Performed at [facility], [address], [place], [place] [address]    Report Status PENDING  Incomplete  Blood Culture (routine x 2)     Status: None (Preliminary result)   Collection Time: 07/11/19  6:41 PM   Specimen: BLOOD  Result Value Ref  Range Status   Specimen Description BLOOD  Final   Special Requests NONE  Final   Culture   Final    NO GROWTH < 12 HOURS Performed at [facility], [address], [place], [place] [address]    Report Status PENDING  Incomplete  Respiratory Panel by RT PCR (Flu A&B, Covid) - Nasopharyngeal Swab     Status: None   Collection Time: 07/11/19  6:42 PM   Specimen: Nasopharyngeal Swab  Result Value Ref Range Status   SARS Coronavirus 2 by RT PCR NEGATIVE NEGATIVE Final    Comment: (NOTE) SARS-CoV-2 target nucleic acids are NOT DETECTED. The SARS-CoV-2 RNA is generally detectable in upper respiratoy specimens during the acute phase of infection. The lowest concentration of SARS-CoV-2 viral copies this assay can detect is 131 copies/mL. A negative result does not preclude SARS-Cov-2 infection and should not be used as the sole basis for treatment or other patient management decisions. A negative result may occur with  improper specimen collection/handling, submission of specimen other than nasopharyngeal swab, presence of viral mutation(s) within the areas targeted by this assay, and inadequate number of viral copies (<131 copies/mL). A negative result must be combined with clinical observations, patient history, and epidemiological information. The expected result is Negative. Fact Sheet for Patients:  PinkCheek.be Fact Sheet for Healthcare Providers:  GravelBags.it This test is not yet ap proved or cleared by the [place] FDA and  has been authorized for detection and/or diagnosis of SARS-CoV-2 by FDA under an Emergency Use Authorization (EUA). This EUA will remain  in effect (meaning this test can be used) for the duration of the COVID-19 declaration under Section 564(b)(1) of the Act, 21 U.S.C. section 360bbb-3(b)(1), unless the authorization is terminated or revoked sooner.    Influenza A by PCR NEGATIVE  NEGATIVE Final   Influenza B by PCR NEGATIVE NEGATIVE Final    Comment: (NOTE) The Xpert Xpress SARS-CoV-2/FLU/RSV assay is intended as an aid in  the diagnosis of influenza from Nasopharyngeal swab specimens and  should not be used as a sole basis for treatment. Nasal washings and  aspirates are unacceptable for Xpert Xpress SARS-CoV-2/FLU/RSV  testing. Fact Sheet for Patients: PinkCheek.be Fact Sheet for Healthcare Providers: GravelBags.it This test is not yet approved or cleared by the [place] FDA and  has been authorized for detection and/or diagnosis of SARS-CoV-2 by  FDA under an Emergency Use Authorization (EUA). This EUA will remain  in effect (meaning this test can be used) for the duration of the  Covid-19 declaration under Section 564(b)(1) of the Act, 21  U.S.C. section 360bbb-3(b)(1), unless the authorization is  terminated or revoked. Performed at [facility], [address], [place], [place] [address]  Urine culture     Status: None   Collection Time: 07/12/19  1:59 AM   Specimen: In/Out Cath Urine  Result Value Ref Range Status   Specimen Description   Final    IN/OUT CATH URINE Performed at [facility], [address], [place], [place] [address]    Special Requests   Final    NONE Performed at [facility], [address] [place], [place] [address]    Culture   Final    NO GROWTH Performed at [facility], [place] [address], [place],  [address]    Report Status 07/12/2019 FINAL  Final     Studies: DG Chest 2 View  Result Date: 07/11/2019 CLINICAL DATA:  Shortness of breath EXAM: CHEST - 2 VIEW COMPARISON:  02/07/2014 FINDINGS: Hyperexpanded lungs with flattening of the diaphragms and increased AP diameter of the chest. Coarsened interstitial markings bilaterally. Patchy bibasilar opacities. Heart size is stable. No pleural effusion. No pneumothorax.  Exaggerated thoracic kyphosis. No acute osseous findings. IMPRESSION: 1. Patchy bibasilar opacities may represent atelectasis or infiltrate. 2. Advance emphysematous lung changes. Electronically Signed   By: Davina Poke D.O.   On: 07/11/2019 14:17   US Venous Img Lower Bilateral (DVT)  Result Date: 07/12/2019 CLINICAL DATA:  Pain and shortness of breath, lung cancer EXAM: BILATERAL LOWER EXTREMITY VENOUS DOPPLER ULTRASOUND TECHNIQUE: Gray-scale sonography with graded compression, as well as color Doppler and duplex ultrasound were performed to evaluate the lower extremity deep venous systems from the level of the common femoral vein and including the common femoral, femoral, profunda femoral, popliteal and calf veins including the posterior tibial, peroneal and gastrocnemius veins when visible. The superficial great saphenous vein was also interrogated. Spectral Doppler was utilized to evaluate flow at rest and with distal augmentation maneuvers in the common femoral, femoral and popliteal veins. COMPARISON:  None. FINDINGS: RIGHT LOWER EXTREMITY Common Femoral Vein: No evidence of thrombus. Normal compressibility, respiratory phasicity and response to augmentation. Saphenofemoral Junction: No evidence of thrombus. Normal compressibility and flow on color Doppler imaging. Profunda Femoral Vein: No evidence of thrombus. Normal compressibility and flow on color Doppler imaging. Femoral Vein: No evidence of thrombus. Normal compressibility, respiratory phasicity and response to augmentation. Popliteal Vein: No evidence of thrombus. Normal compressibility, respiratory phasicity and response to augmentation. Calf Veins: No evidence of thrombus. Normal compressibility and flow on color Doppler imaging. LEFT LOWER EXTREMITY Common Femoral Vein: No evidence of thrombus. Normal compressibility, respiratory phasicity and response to augmentation. Saphenofemoral Junction: No evidence of thrombus. Normal  compressibility and flow on color Doppler imaging. Profunda Femoral Vein: No evidence of thrombus. Normal compressibility and flow on color Doppler imaging. Femoral Vein: No evidence of thrombus. Normal compressibility, respiratory phasicity and response to augmentation. Popliteal Vein: No evidence of thrombus. Normal compressibility, respiratory phasicity and response to augmentation. Calf Veins: No evidence of thrombus. Normal compressibility and flow on color Doppler imaging. IMPRESSION: No evidence of deep venous thrombosis in either lower extremity. Electronically Signed   By: Jerilynn Mages.  Shick M.D.   On: 07/12/2019 14:55    Scheduled Meds: . calcium-vitamin D   Oral Daily  . clobetasol cream   Topical BID  . docusate  50 mg Oral Daily  . ferrous sulfate  325 mg Oral Daily  . insulin aspart  0-5 Units Subcutaneous QHS  . insulin aspart  0-9 Units Subcutaneous TID WC  . ipratropium-albuterol  3 mL Nebulization Q6H  . loratadine  10 mg Oral Daily  .  methylPREDNISolone (SOLU-MEDROL) injection  40 mg Intravenous Q12H  . pantoprazole  40 mg Oral Daily  . polyethylene glycol  17 g Oral Daily  . pravastatin  40 mg Oral q1800  . sertraline  50 mg Oral Daily   Continuous Infusions: . azithromycin 500 mg (07/12/19 1748)  . cefTRIAXone (ROCEPHIN)  IV 2 g (07/13/19 0705)    Assessment/Plan:  1. Acute on chronic hypoxic respiratory failure.  Patient was on 6 L yesterday.  Today was on 4 L and I dialed her down to 3.5 L.   2. Lobar pneumonia, elevated procalcitonin and leukocytosis.  Patient placed on Rocephin and Zithromax.  White blood cell count trending better. 3. COPD exacerbation on nebulizer treatments and steroids 4. Iron deficiency anemia and weight loss.  Patient is guaiac positive which probably goes along with her chronic blood loss anemia.  We will get a CAT scan of the chest abdomen pelvis for further evaluation.  Patient states that she had a capsule endoscopy and colonoscopy recently but I  do not see the results in our system.  May end up needing a blood transfusion prior to disposition. 5. Lactic acidosis.  Stop IV fluids today since hemoglobin drifting down. 6. Depression on Zoloft 7. Hyperlipidemia unspecified on pravastatin 8. Constipation on MiraLAX and Colace 9. Weight loss.  We will get CT scan of the chest abdomen and pelvis. 10. Acute kidney injury with underlying chronic kidney disease stage III. 11. Impaired fasting glucose.  Hemoglobin A1c 5.7.  Sliding scale insulin while on steroids.  Code Status:     Code Status Orders  (From admission, onward)         Start     Ordered   07/11/19 2331  Full code  Continuous     07/11/19 2335        Code Status History    This patient has a current code status but no historical code status.   Advance Care Planning Activity     Family Communication: Spoke with son on the phone Disposition Plan: We will likely need a few more days in the hospital.  Patient will go home with home health once medically cleared.  Antibiotics:  Rocephin  Zithromax  Time spent: 28 minutes  [place]

## 2019-07-14 DIAGNOSIS — B370 Candidal stomatitis: Secondary | ICD-10-CM

## 2019-07-14 LAB — CBC
HCT: 27.4 % — ABNORMAL LOW (ref 36.0–46.0)
Hemoglobin: 8.2 g/dL — ABNORMAL LOW (ref 12.0–15.0)
MCH: 26.5 pg (ref 26.0–34.0)
MCHC: 29.9 g/dL — ABNORMAL LOW (ref 30.0–36.0)
MCV: 88.7 fL (ref 80.0–100.0)
Platelets: 266 10*3/uL (ref 150–400)
RBC: 3.09 MIL/uL — ABNORMAL LOW (ref 3.87–5.11)
RDW: 18 % — ABNORMAL HIGH (ref 11.5–15.5)
WBC: 17 10*3/uL — ABNORMAL HIGH (ref 4.0–10.5)
nRBC: 0.1 % (ref 0.0–0.2)

## 2019-07-14 LAB — BASIC METABOLIC PANEL
Anion gap: 7 (ref 5–15)
BUN: 32 mg/dL — ABNORMAL HIGH (ref 8–23)
CO2: 29 mmol/L (ref 22–32)
Calcium: 7.8 mg/dL — ABNORMAL LOW (ref 8.9–10.3)
Chloride: 106 mmol/L (ref 98–111)
Creatinine, Ser: 1.13 mg/dL — ABNORMAL HIGH (ref 0.44–1.00)
GFR calc Af Amer: 51 mL/min — ABNORMAL LOW (ref >60)
GFR calc non Af Amer: 44 mL/min — ABNORMAL LOW (ref >60)
Glucose, Bld: 113 mg/dL — ABNORMAL HIGH (ref 70–99)
Potassium: 3.8 mmol/L (ref 3.5–5.1)
Sodium: 142 mmol/L (ref 135–145)

## 2019-07-14 LAB — GLUCOSE, CAPILLARY
Glucose-Capillary: 108 mg/dL — ABNORMAL HIGH (ref 70–99)
Glucose-Capillary: 128 mg/dL — ABNORMAL HIGH (ref 70–99)
Glucose-Capillary: 95 mg/dL (ref 70–99)
Glucose-Capillary: 138 mg/dL — ABNORMAL HIGH (ref 70–99)

## 2019-07-14 MED ORDER — NYSTATIN 100000 UNIT/ML MT SUSP
5.0000 mL | Freq: Four times a day (QID) | OROMUCOSAL | Status: DC
  Administered 2019-07-14 – 2019-07-15 (×4): 500000 [IU] via ORAL
  Filled 2019-07-14 (×4): qty 5

## 2019-07-14 MED ORDER — SODIUM CHLORIDE 0.9 % IV SOLN
400.0000 mg | Freq: Once | INTRAVENOUS | Status: AC
  Administered 2019-07-14: 400 mg via INTRAVENOUS
  Filled 2019-07-14: qty 20

## 2019-07-14 MED ORDER — PREDNISONE 20 MG PO TABS
40.0000 mg | ORAL_TABLET | Freq: Every day | ORAL | Status: DC
  Administered 2019-07-15: 40 mg via ORAL
  Filled 2019-07-14: qty 2

## 2019-07-14 NOTE — Progress Notes (Signed)
Pt has had a pretty good day.  Received iron sucrose iv and tol well.  Up to chair and amb with assist to bathroom. tol we.. p.t. saw and amb in hall tol well. Eating well.

## 2019-07-14 NOTE — Progress Notes (Signed)
Occupational Therapy Treatment Patient Details Name: Margaret [name] MRN: [id number] DOB: 1931/06/15 Today's Date: 07/14/2019    History of present illness Patient is an 85 year old female admitted reports of weakness, sore throad and increased SOB diagnosed wiith community aquired PNA. Home O2 3-4 lpm, PMH includes: COPD. Per testing, pt negative for bilat DVTs.   OT comments  Pt seen for OT tx this date. Pt reports feeling well. Pt educated in benefits of out of bed and improved positioning to support lung function. Pt verbalized understanding and agreeable to sit up in recliner. CGA for transfer and ambulating from EOB to recliner. HR 107, O2 sats 98% on 3L O2. Pt progressing towards goals. Continues to benefit from skilled OT services.    Follow Up Recommendations  Home health OT;Other (comment)(home health aide)    Equipment Recommendations  None recommended by OT    Recommendations for Other Services      Precautions / Restrictions Precautions Precautions: Fall Restrictions Weight Bearing Restrictions: No       Mobility Bed Mobility Overal bed mobility: Independent                Transfers Overall transfer level: Needs assistance Equipment used: 1 person hand held assist Transfers: Sit to/from Stand Sit to Stand: Min guard              Balance Overall balance assessment: Needs assistance Sitting-balance support: Feet supported Sitting balance-Leahy Scale: Good     Standing balance support: Single extremity supported Standing balance-Leahy Scale: Fair                             ADL either performed or assessed with clinical judgement   ADL Overall ADL's : Needs assistance/impaired                                       General ADL Comments: continues to require CGA for functional ADL transfers, PRN Min A for LB ADL, need to watch O2 sats with exertion     Vision Baseline Vision/History: Wears glasses Wears Glasses:  At all times Patient Visual Report: No change from baseline     Perception     Praxis      Cognition Arousal/Alertness: Awake/alert Behavior During Therapy: WFL for tasks assessed/performed Overall Cognitive Status: Within Functional Limits for tasks assessed                                          Exercises Other Exercises Other Exercises: Pt educated in benefits of upright sitting in recliner for improved positioning and lung function   Shoulder Instructions       General Comments      Pertinent Vitals/ Pain       Pain Assessment: No/denies pain  Home Living                                          Prior Functioning/Environment              Frequency  Min 1X/week        Progress Toward Goals  OT Goals(current goals can now be found in the  care plan section)  Progress towards OT goals: Progressing toward goals  Acute Rehab OT Goals Patient Stated Goal: to feel better, go home OT Goal Formulation: With patient Time For Goal Achievement: 07/26/19 Potential to Achieve Goals: Good  Plan Discharge plan remains appropriate;Frequency remains appropriate    Co-evaluation                 AM-PAC OT "6 Clicks" Daily Activity     Outcome Measure   Help from another person eating meals?: None Help from another person taking care of personal grooming?: A Little Help from another person toileting, which includes using toliet, bedpan, or urinal?: A Little Help from another person bathing (including washing, rinsing, drying)?: A Little Help from another person to put on and taking off regular upper body clothing?: None Help from another person to put on and taking off regular lower body clothing?: A Little 6 Click Score: 20    End of Session Equipment Utilized During Treatment: Gait belt;Oxygen  OT Visit Diagnosis: Other abnormalities of gait and mobility (R26.89);Muscle weakness (generalized) (M62.81)   Activity  Tolerance Patient tolerated treatment well   Patient Left with call bell/phone within reach;in chair;with chair alarm set   Nurse Communication          Time: 8333-8329 OT Time Calculation (min): 10 min  Charges: OT General Charges $OT Visit: 1 Visit OT Treatments $Therapeutic Activity: 8-22 mins  Jeni Salles, MPH, MS, OTR/L ascom [phone] 07/14/19, 11:38 AM

## 2019-07-14 NOTE — TOC Progression Note (Signed)
Transition of Care [facility]) - Progression Note    Patient Details  Name: Margaret [name] MRN: [id number] Date of Birth: 27-Jun-1931  [facility] CM/SW Contact  Shelbie Ammons, RN Phone Number: 07/14/2019, 3:31 PM  Clinical Narrative:     Patient is progressing, MD is weaning down oxygen. Did have CT and no Cancerous processes were noted. Patient getting stronger according to floor nurse. Still unable to finds [facility] agency to take patient.    Expected Discharge Plan: [place] Barriers to Discharge: Continued Medical Work up  Expected Discharge Plan and Services Expected Discharge Plan: [place] Choice: [place] arrangements for the past 2 months: Single Family Home                                       Social Determinants of Health (SDOH) Interventions    Readmission Risk Interventions No flowsheet data found.

## 2019-07-14 NOTE — Care Management Important Message (Signed)
Important Message  Patient Details  Name: Margaret [name] MRN: [id number] Date of Birth: [birth year]   Medicare Important Message Given:  Yes     Juliann Pulse A Brodey Bonn 07/14/2019, 11:08 AM

## 2019-07-14 NOTE — Progress Notes (Signed)
C/o "white sores" inside lower lip. Oral care given

## 2019-07-14 NOTE — Progress Notes (Signed)
Patient ID: Margaret [name], female   DOB: 1931/05/23, 85 y.o.   MRN: [id number] [facility] PROGRESS NOTE  LEANAH KOLANDER WUJ:[id number] DOB: May 01, 1931 DOA: 07/11/2019 PCP: Rusty Aus, MD  HPI/Subjective: Patient breathing better.  Coughing a lot this morning.  Was down to 3 L this morning.  States she has some mouth ulcers.  Objective: Vitals:   07/14/19 1127 07/14/19 1216  BP:    Pulse: (!) 107 (!) 108  Resp:  20  Temp:    SpO2: 98% 96%    Intake/Output Summary (Last 24 hours) at 07/14/2019 1304 Last data filed at 07/14/2019 0508 Gross per 24 hour  Intake 1132.46 ml  Output --  Net 1132.46 ml   Filed Weights   07/11/19 1331  Weight: 44.9 kg    ROS: Review of Systems  Constitutional: Positive for malaise/fatigue. Negative for chills and fever.  HENT: Positive for sore throat.   Eyes: Negative for blurred vision.  Respiratory: Positive for cough and shortness of breath. Negative for wheezing.   Cardiovascular: Negative for chest pain.  Gastrointestinal: Negative for abdominal pain, constipation, diarrhea, nausea and vomiting.  Genitourinary: Negative for dysuria.  Musculoskeletal: Positive for joint pain.  Neurological: Negative for dizziness and headaches.   Exam: Physical Exam  Constitutional: She is oriented to person, place, and time.  HENT:  Nose: No mucosal edema.  Mouth/Throat: No oropharyngeal exudate or posterior oropharyngeal edema.  Eyes: Pupils are equal, round, and reactive to light. Conjunctivae, EOM and lids are normal.  Neck: Carotid bruit is not present.  Cardiovascular: S1 normal and S2 normal. Exam reveals no gallop.  No murmur heard. Pulses:      Dorsalis pedis pulses are 2+ on the right side and 2+ on the left side.  Respiratory: No respiratory distress. She has decreased breath sounds in the right lower field and the left lower field. She has no wheezes. She has no rhonchi. She has no rales.  GI: Soft. Bowel sounds are normal. There  is no abdominal tenderness.  Musculoskeletal:     Right ankle: No swelling.     Left ankle: No swelling.  Lymphadenopathy:    She has no cervical adenopathy.  Neurological: She is alert and oriented to person, place, and time. No cranial nerve deficit.  Skin: Skin is warm. Nails show no clubbing.  Psoriasis rash bilateral lower extremities  Psychiatric: She has a normal mood and affect.      Data Reviewed: Basic Metabolic Panel: Recent Labs  Lab 07/11/19 1332 07/11/19 1840 07/12/19 0438 07/13/19 0620 07/14/19 0744  NA 140 141 138 142 142  K 3.6 3.7 3.5 3.5 3.8  CL 101 100 102 106 106  CO2 25 28 25 26 29   GLUCOSE 164* 138* 370* 203* 113*  BUN 32* 34* 36* 38* 32*  CREATININE 1.18* 1.33* 1.34* 1.25* 1.13*  CALCIUM 8.2* 8.5* 7.6* 8.0* 7.8*   Liver Function Tests: Recent Labs  Lab 07/11/19 1840  AST 22  ALT 15  ALKPHOS 81  BILITOT 0.7  PROT 8.0  ALBUMIN 3.9   CBC: Recent Labs  Lab 07/11/19 1332 07/12/19 0438 07/13/19 0620 07/14/19 0744  WBC 37.8* 24.3* 16.5* 17.0*  HGB 9.9* 8.4* 7.9* 8.2*  HCT 32.3* 27.2* 25.0* 27.4*  MCV 89.2 86.6 85.6 88.7  PLT 270 190 222 266     Recent Results (from the past 240 hour(s))  Blood Culture (routine x 2)     Status: None (Preliminary result)   Collection Time: 07/11/19  6:40 PM   Specimen: BLOOD  Result Value Ref Range Status   Specimen Description BLOOD BLOOD LEFT FOREARM  Final   Special Requests   Final    BOTTLES DRAWN AEROBIC AND ANAEROBIC Blood Culture adequate volume   Culture   Final    NO GROWTH 3 DAYS Performed at [facility], [address] [place], [place] [address]    Report Status PENDING  Incomplete  Blood Culture (routine x 2)     Status: None (Preliminary result)   Collection Time: 07/11/19  6:41 PM   Specimen: BLOOD  Result Value Ref Range Status   Specimen Description BLOOD  Final   Special Requests NONE  Final   Culture   Final    NO GROWTH 3 DAYS Performed at [facility], [address] [place], [place] [address]    Report Status PENDING  Incomplete  Respiratory Panel by RT PCR (Flu A&B, Covid) - Nasopharyngeal Swab     Status: None   Collection Time: 07/11/19  6:42 PM   Specimen: Nasopharyngeal Swab  Result Value Ref Range Status   SARS Coronavirus 2 by RT PCR NEGATIVE NEGATIVE Final    Comment: (NOTE) SARS-CoV-2 target nucleic acids are NOT DETECTED. The SARS-CoV-2 RNA is generally detectable in upper respiratoy specimens during the acute phase of infection. The lowest concentration of SARS-CoV-2 viral copies this assay can detect is 131 copies/mL. A negative result does not preclude SARS-Cov-2 infection and should not be used as the sole basis for treatment or other patient management decisions. A negative result may occur with  improper specimen collection/handling, submission of specimen other than nasopharyngeal swab, presence of viral mutation(s) within the areas targeted by this assay, and inadequate number of viral copies (<131 copies/mL). A negative result must be combined with clinical observations, patient history, and epidemiological information. The expected result is Negative. Fact Sheet for Patients:  PinkCheek.be Fact Sheet for Healthcare Providers:  GravelBags.it This test is not yet ap proved or cleared by the [place] FDA and  has been authorized for detection and/or diagnosis of SARS-CoV-2 by FDA under an Emergency Use Authorization (EUA). This EUA will remain  in effect (meaning this test can be used) for the duration of the COVID-19 declaration under Section 564(b)(1) of the Act, 21 U.S.C. section 360bbb-3(b)(1), unless the authorization is terminated or revoked sooner.    Influenza A by PCR NEGATIVE NEGATIVE Final   Influenza B by PCR NEGATIVE NEGATIVE Final    Comment: (NOTE) The Xpert Xpress SARS-CoV-2/FLU/RSV assay is intended as an aid in  the  diagnosis of influenza from Nasopharyngeal swab specimens and  should not be used as a sole basis for treatment. Nasal washings and  aspirates are unacceptable for Xpert Xpress SARS-CoV-2/FLU/RSV  testing. Fact Sheet for Patients: PinkCheek.be Fact Sheet for Healthcare Providers: GravelBags.it This test is not yet approved or cleared by the [place] FDA and  has been authorized for detection and/or diagnosis of SARS-CoV-2 by  FDA under an Emergency Use Authorization (EUA). This EUA will remain  in effect (meaning this test can be used) for the duration of the  Covid-19 declaration under Section 564(b)(1) of the Act, 21  U.S.C. section 360bbb-3(b)(1), unless the authorization is  terminated or revoked. Performed at [facility], [address], [place], [place] [address]   Urine culture     Status: None   Collection Time: 07/12/19  1:59 AM   Specimen: In/Out Cath Urine  Result Value  Ref Range Status   Specimen Description   Final    IN/OUT CATH URINE Performed at [facility], [address] [place], [place] [address]    Special Requests   Final    NONE Performed at [facility], [address] [place], [place] [address]    Culture   Final    NO GROWTH Performed at [facility], [place] [address] [place], [place] [address]    Report Status 07/12/2019 FINAL  Final     Studies: CT ANGIO CHEST PE W OR WO CONTRAST  Result Date: 07/13/2019 CLINICAL DATA:  85 year old female with acute on chronic hypoxic respiratory failure. Shortness of breath. EXAM: CT ANGIOGRAPHY CHEST, ABDOMEN AND PELVIS TECHNIQUE: Multidetector CT imaging through the chest, abdomen and pelvis was performed using the standard protocol during bolus administration of intravenous contrast. Multiplanar reconstructed images and MIPs were obtained and reviewed to evaluate the vascular anatomy. CONTRAST:  30mL OMNIPAQUE  IOHEXOL 350 MG/ML SOLN COMPARISON:  Chest radiograph dated 07/11/2019 and CT dated 08/17/2015. FINDINGS: Evaluation of this exam is limited due to respiratory motion artifact. Evaluation is also limited due to streak artifact caused by patient's arms. CTA CHEST FINDINGS Cardiovascular: There is no cardiomegaly or pericardial effusion. Advanced atherosclerotic calcification of the thoracic aorta. No aneurysmal dilatation or dissection. The origins of the great vessels of the aortic arch appear patent as visualized. Evaluation of the pulmonary arteries is limited due to respiratory motion artifact and suboptimal opacification and visualization of the peripheral branches. No large or central pulmonary artery embolus identified. Mediastinum/Nodes: No hilar or mediastinal adenopathy. The esophagus is grossly unremarkable. No mediastinal fluid collection. Lungs/Pleura: There is background of moderate to severe emphysema. Large areas of bilateral lower lobe consolidative changes most consistent with pneumonia, possibly aspiration. Clinical correlation is recommended. Small bilateral pleural effusions noted. Mucous content noted in the right bronchus intermedius and right lower lobe bronchus. No pneumothorax. Musculoskeletal: Osteopenia with degenerative changes of the spine. No acute osseous pathology. Review of the MIP images confirms the above findings. CTA ABDOMEN AND PELVIS FINDINGS VASCULAR Aorta: Advanced aortoiliac atherosclerotic disease. No aneurysmal dilatation or dissection. Celiac: Patent without evidence of aneurysm, dissection, vasculitis or significant stenosis. SMA: Patent without evidence of aneurysm, dissection, vasculitis or significant stenosis. Renals: Atherosclerotic calcification of the origins of the renal arteries. The renal arteries remain patent. IMA: Patent without evidence of aneurysm, dissection, vasculitis or significant stenosis. Inflow: Atherosclerotic calcification of the iliac arteries.  The iliac arteries are patent. Veins: The IVC is unremarkable. The SMV, splenic vein, and main portal vein are patent. No portal venous gas. Review of the MIP images confirms the above findings. NON-VASCULAR No intra-abdominal free air or free fluid Hepatobiliary: The liver is unremarkable. There is mild intrahepatic biliary ductal dilatation, likely post cholecystectomy. Pancreas: Unremarkable. No pancreatic ductal dilatation or surrounding inflammatory changes. Spleen: Normal in size without focal abnormality. Adrenals/Urinary Tract: The adrenal glands are unremarkable. Mild bilateral renal parenchyma atrophy. There is no hydronephrosis on either side. There is symmetric enhancement and excretion of contrast by both kidneys. The visualized ureters and urinary bladder appear unremarkable. Stomach/Bowel: There is no bowel obstruction or active inflammation. Small scattered sigmoid diverticula without active inflammatory changes noted. Appendectomy. Lymphatic: No adenopathy. Reproductive: Hysterectomy. No adnexal masses. Other: Diffuse subcutaneous edema. No fluid collection. Musculoskeletal: Osteopenia with degenerative changes of the spine. No acute osseous pathology. Bilateral L5 pars defects with grade 2 L5-S1 anterolisthesis. Review of the MIP images confirms the above findings.  IMPRESSION: 1. Bilateral lower lobe consolidative changes consistent with pneumonia, and possibly aspiration. 2. Mucous content noted in the bronchus intermedius and right lower lobe bronchus. 3. No CT evidence of central pulmonary artery embolus. 4. No acute intra-abdominopelvic pathology. 5. Aortic Atherosclerosis (ICD10-I70.0) and Emphysema (ICD10-J43.9). Electronically Signed   By: Anner Crete M.D.   On: 07/13/2019 15:10   CT ABDOMEN PELVIS W CONTRAST  Result Date: 07/13/2019 CLINICAL DATA:  85 year old female with acute on chronic hypoxic respiratory failure. Shortness of breath. EXAM: CT ANGIOGRAPHY CHEST, ABDOMEN AND  PELVIS TECHNIQUE: Multidetector CT imaging through the chest, abdomen and pelvis was performed using the standard protocol during bolus administration of intravenous contrast. Multiplanar reconstructed images and MIPs were obtained and reviewed to evaluate the vascular anatomy. CONTRAST:  85mL OMNIPAQUE IOHEXOL 350 MG/ML SOLN COMPARISON:  Chest radiograph dated 07/11/2019 and CT dated 08/17/2015. FINDINGS: Evaluation of this exam is limited due to respiratory motion artifact. Evaluation is also limited due to streak artifact caused by patient's arms. CTA CHEST FINDINGS Cardiovascular: There is no cardiomegaly or pericardial effusion. Advanced atherosclerotic calcification of the thoracic aorta. No aneurysmal dilatation or dissection. The origins of the great vessels of the aortic arch appear patent as visualized. Evaluation of the pulmonary arteries is limited due to respiratory motion artifact and suboptimal opacification and visualization of the peripheral branches. No large or central pulmonary artery embolus identified. Mediastinum/Nodes: No hilar or mediastinal adenopathy. The esophagus is grossly unremarkable. No mediastinal fluid collection. Lungs/Pleura: There is background of moderate to severe emphysema. Large areas of bilateral lower lobe consolidative changes most consistent with pneumonia, possibly aspiration. Clinical correlation is recommended. Small bilateral pleural effusions noted. Mucous content noted in the right bronchus intermedius and right lower lobe bronchus. No pneumothorax. Musculoskeletal: Osteopenia with degenerative changes of the spine. No acute osseous pathology. Review of the MIP images confirms the above findings. CTA ABDOMEN AND PELVIS FINDINGS VASCULAR Aorta: Advanced aortoiliac atherosclerotic disease. No aneurysmal dilatation or dissection. Celiac: Patent without evidence of aneurysm, dissection, vasculitis or significant stenosis. SMA: Patent without evidence of aneurysm,  dissection, vasculitis or significant stenosis. Renals: Atherosclerotic calcification of the origins of the renal arteries. The renal arteries remain patent. IMA: Patent without evidence of aneurysm, dissection, vasculitis or significant stenosis. Inflow: Atherosclerotic calcification of the iliac arteries. The iliac arteries are patent. Veins: The IVC is unremarkable. The SMV, splenic vein, and main portal vein are patent. No portal venous gas. Review of the MIP images confirms the above findings. NON-VASCULAR No intra-abdominal free air or free fluid Hepatobiliary: The liver is unremarkable. There is mild intrahepatic biliary ductal dilatation, likely post cholecystectomy. Pancreas: Unremarkable. No pancreatic ductal dilatation or surrounding inflammatory changes. Spleen: Normal in size without focal abnormality. Adrenals/Urinary Tract: The adrenal glands are unremarkable. Mild bilateral renal parenchyma atrophy. There is no hydronephrosis on either side. There is symmetric enhancement and excretion of contrast by both kidneys. The visualized ureters and urinary bladder appear unremarkable. Stomach/Bowel: There is no bowel obstruction or active inflammation. Small scattered sigmoid diverticula without active inflammatory changes noted. Appendectomy. Lymphatic: No adenopathy. Reproductive: Hysterectomy. No adnexal masses. Other: Diffuse subcutaneous edema. No fluid collection. Musculoskeletal: Osteopenia with degenerative changes of the spine. No acute osseous pathology. Bilateral L5 pars defects with grade 2 L5-S1 anterolisthesis. Review of the MIP images confirms the above findings. IMPRESSION: 1. Bilateral lower lobe consolidative changes consistent with pneumonia, and possibly aspiration. 2. Mucous content noted in the bronchus intermedius and right lower lobe bronchus. 3. No CT evidence of  central pulmonary artery embolus. 4. No acute intra-abdominopelvic pathology. 5. Aortic Atherosclerosis (ICD10-I70.0) and  Emphysema (ICD10-J43.9). Electronically Signed   By: Anner Crete M.D.   On: 07/13/2019 15:10   US Venous Img Lower Bilateral (DVT)  Result Date: 07/12/2019 CLINICAL DATA:  Pain and shortness of breath, lung cancer EXAM: BILATERAL LOWER EXTREMITY VENOUS DOPPLER ULTRASOUND TECHNIQUE: Gray-scale sonography with graded compression, as well as color Doppler and duplex ultrasound were performed to evaluate the lower extremity deep venous systems from the level of the common femoral vein and including the common femoral, femoral, profunda femoral, popliteal and calf veins including the posterior tibial, peroneal and gastrocnemius veins when visible. The superficial great saphenous vein was also interrogated. Spectral Doppler was utilized to evaluate flow at rest and with distal augmentation maneuvers in the common femoral, femoral and popliteal veins. COMPARISON:  None. FINDINGS: RIGHT LOWER EXTREMITY Common Femoral Vein: No evidence of thrombus. Normal compressibility, respiratory phasicity and response to augmentation. Saphenofemoral Junction: No evidence of thrombus. Normal compressibility and flow on color Doppler imaging. Profunda Femoral Vein: No evidence of thrombus. Normal compressibility and flow on color Doppler imaging. Femoral Vein: No evidence of thrombus. Normal compressibility, respiratory phasicity and response to augmentation. Popliteal Vein: No evidence of thrombus. Normal compressibility, respiratory phasicity and response to augmentation. Calf Veins: No evidence of thrombus. Normal compressibility and flow on color Doppler imaging. LEFT LOWER EXTREMITY Common Femoral Vein: No evidence of thrombus. Normal compressibility, respiratory phasicity and response to augmentation. Saphenofemoral Junction: No evidence of thrombus. Normal compressibility and flow on color Doppler imaging. Profunda Femoral Vein: No evidence of thrombus. Normal compressibility and flow on color Doppler imaging. Femoral  Vein: No evidence of thrombus. Normal compressibility, respiratory phasicity and response to augmentation. Popliteal Vein: No evidence of thrombus. Normal compressibility, respiratory phasicity and response to augmentation. Calf Veins: No evidence of thrombus. Normal compressibility and flow on color Doppler imaging. IMPRESSION: No evidence of deep venous thrombosis in either lower extremity. Electronically Signed   By: Jerilynn Mages.  Shick M.D.   On: 07/12/2019 14:55    Scheduled Meds: . calcium-vitamin D   Oral Daily  . clobetasol cream   Topical BID  . docusate  50 mg Oral Daily  . ferrous sulfate  325 mg Oral Daily  . insulin aspart  0-5 Units Subcutaneous QHS  . insulin aspart  0-9 Units Subcutaneous TID WC  . ipratropium-albuterol  3 mL Nebulization TID  . loratadine  10 mg Oral Daily  . pantoprazole  40 mg Oral Daily  . polyethylene glycol  17 g Oral Daily  . pravastatin  40 mg Oral q1800  . [START ON 07/15/2019] predniSONE  40 mg Oral Q breakfast  . sertraline  50 mg Oral Daily   Continuous Infusions: . cefTRIAXone (ROCEPHIN)  IV 2 g (07/14/19 0508)  . iron sucrose      Assessment/Plan:  1. Acute on chronic hypoxic respiratory failure.  Patient was on 3 L today we will try to dial down to 2-1/2.  Try to get down to the lowest amount of oxygen possible. 2. Lobar pneumonia, elevated procalcitonin and leukocytosis.  Patient placed on Rocephin and Zithromax.  3. COPD exacerbation on nebulizer treatments.  Switch steroids over to oral for tomorrow.  Since lungs are better can potentially go home tomorrow. 4. Iron deficiency anemia and weight loss.  Patient is guaiac positive which probably goes along with her chronic blood loss anemia.  CT scan of the chest abdomen pelvis negative for cancerous process.  The patient states that she does not believe that she can tolerate GI procedures. I agree with this assessment at this time.  We will give IV Venofer for her to build up her own blood counts.   Hemoglobin 8.2 today.  No need for transfusion currently. 5. Lactic acidosis.  Improved with IV fluids 6. Depression on Zoloft 7. Hyperlipidemia unspecified on pravastatin 8. Constipation on MiraLAX 9. Weight loss.  CT scan of the chest abdomen pelvis negative for cancerous process 10. Acute kidney injury with underlying chronic kidney disease stage IIIb. 11. Impaired fasting glucose.  Hemoglobin A1c 5.7.  Sliding scale insulin while on steroids. 12. Thrush and mouth ulcers.  Start nystatin swish and swallow  Code Status:     Code Status Orders  (From admission, onward)         Start     Ordered   07/11/19 2331  Full code  Continuous     07/11/19 2335        Code Status History    This patient has a current code status but no historical code status.   Advance Care Planning Activity     Family Communication: Spoke with son on the phone Disposition Plan: Potentially home tomorrow  Antibiotics:  Rocephin  Zithromax course finished  Time spent: 27 minutes  [place]

## 2019-07-14 NOTE — Plan of Care (Signed)

## 2019-07-14 NOTE — Progress Notes (Signed)
Physical Therapy Treatment Patient Details Name: Margaret [name] MRN: [id number] DOB: 24-Apr-1931 Today's Date: 07/14/2019    History of Present Illness Patient is an 85 year old female admitted reports of weakness, sore throad and increased SOB diagnosed wiith community aquired PNA. Home O2 3-4 lpm, PMH includes: COPD. Per testing, pt negative for bilat DVTs.    PT Comments    Pt is making good progress this date with increased endurance with ambulation, however still limited by SOB and fatigue. RW delivered this date, adjusted to height and was able to use in hallway. Pt doesn't think the wheels roll as smooth as she'd like. Due to effort/exertion with ambulation, deferred there-ex this date. All mobility performed on 3L of O2 with sats at 94%. Will continue to progress as able. Pt is hopeful for discharge next date.   Follow Up Recommendations  Home health PT     Equipment Recommendations  Rolling walker with 5" wheels    Recommendations for Other Services       Precautions / Restrictions Precautions Precautions: Fall Restrictions Weight Bearing Restrictions: No    Mobility  Bed Mobility               General bed mobility comments: not performed as received in recliner upon arrival  Transfers Overall transfer level: Needs assistance Equipment used: Rolling walker (2 wheeled) Transfers: Sit to/from Stand Sit to Stand: Supervision         General transfer comment: cues for pushing from seated surface. Once standing, good balance noted  Ambulation/Gait Ambulation/Gait assistance: Min guard Gait Distance (Feet): 200 Feet Assistive device: Rolling walker (2 wheeled) Gait Pattern/deviations: Step-through pattern;Trunk flexed;Narrow base of support;Decreased stride length     General Gait Details: able to ambulate entire RN station, although did need 1 standing rest break. Pt fatigued quickly despite energy conservation techniques. All mobility performed on 3L of O2  with sats maintaining on 94%. Increased work of breathing and SOB symptoms noted   Marine scientist [place] (Stroke Patients Only)       Balance Overall balance assessment: Needs assistance Sitting-balance support: Feet supported Sitting balance-Leahy Scale: Good     Standing balance support: Single extremity supported Standing balance-Leahy Scale: Good Standing balance comment: on RW                            Cognition Arousal/Alertness: Awake/alert Behavior During Therapy: WFL for tasks assessed/performed Overall Cognitive Status: Within Functional Limits for tasks assessed                                        Exercises Other Exercises Other Exercises: too SOB post ambulation to perform ther-ex, deferred    General Comments        Pertinent Vitals/Pain Pain Assessment: No/denies pain    Home Living                      Prior Function            PT Goals (current goals can now be found in the care plan section) Acute Rehab PT Goals Patient Stated Goal: to feel better, go home PT Goal Formulation: With patient Time For Goal Achievement: 07/26/19 Potential to Achieve Goals: Good Progress towards  PT goals: Progressing toward goals    Frequency    Min 2X/week      PT Plan Current plan remains appropriate    Co-evaluation              AM-PAC PT "6 Clicks" Mobility   Outcome Measure  Help needed turning from your back to your side while in a flat bed without using bedrails?: None Help needed moving from lying on your back to sitting on the side of a flat bed without using bedrails?: None Help needed moving to and from a bed to a chair (including a wheelchair)?: A Little Help needed standing up from a chair using your arms (e.g., wheelchair or bedside chair)?: A Little Help needed to walk in hospital room?: A Little Help needed climbing 3-5 steps with a railing? : A  Lot 6 Click Score: 19    End of Session Equipment Utilized During Treatment: Gait belt;Oxygen Activity Tolerance: Patient tolerated treatment well Patient left: in chair;with chair alarm set Nurse Communication: Mobility status PT Visit Diagnosis: Unsteadiness on feet (R26.81);Muscle weakness (generalized) (M62.81);Difficulty in walking, not elsewhere classified (R26.2)     Time: 9758-8325 PT Time Calculation (min) (ACUTE ONLY): 23 min  Charges:  $Gait Training: 23-37 mins                     Margaret [name], [place], DPT [phone]    Margaret [name] 07/14/2019, 3:29 PM

## 2019-07-15 LAB — GLUCOSE, CAPILLARY
Glucose-Capillary: 91 mg/dL (ref 70–99)
Glucose-Capillary: 110 mg/dL — ABNORMAL HIGH (ref 70–99)

## 2019-07-15 LAB — CBC
HCT: 28.2 % — ABNORMAL LOW (ref 36.0–46.0)
Hemoglobin: 8.4 g/dL — ABNORMAL LOW (ref 12.0–15.0)
MCH: 26.3 pg (ref 26.0–34.0)
MCHC: 29.8 g/dL — ABNORMAL LOW (ref 30.0–36.0)
MCV: 88.1 fL (ref 80.0–100.0)
Platelets: 297 10*3/uL (ref 150–400)
RBC: 3.2 MIL/uL — ABNORMAL LOW (ref 3.87–5.11)
RDW: 17.9 % — ABNORMAL HIGH (ref 11.5–15.5)
WBC: 20.3 10*3/uL — ABNORMAL HIGH (ref 4.0–10.5)
nRBC: 0 % (ref 0.0–0.2)

## 2019-07-15 MED ORDER — NYSTATIN 100000 UNIT/ML MT SUSP: 5.0000 mL | Freq: Four times a day (QID) | OROMUCOSAL | 0 refills | Status: AC

## 2019-07-15 MED ORDER — MOMETASONE FURO-FORMOTEROL FUM 100-5 MCG/ACT IN AERO: 2.0000 | INHALATION_SPRAY | Freq: Two times a day (BID) | RESPIRATORY_TRACT | 0 refills | Status: DC

## 2019-07-15 MED ORDER — CEFDINIR 300 MG PO CAPS: 300.0000 mg | ORAL_CAPSULE | Freq: Every day | ORAL | 0 refills | Status: AC

## 2019-07-15 NOTE — TOC Transition Note (Signed)
Transition of Care [facility]) - CM/SW Discharge Note   Patient Details  Name: Margaret [name] MRN: [id number] Date of Birth: December 24, 1930  [facility] CM/SW Contact:  Su Hilt, RN Phone Number: 07/15/2019, 10:29 AM   Clinical Narrative:    Reached out to Cassie at [facility] to see if they would be able to accept the patient for [facility] services, They are not able to accept the patient due to being Out of Network, she is on Chronic O2 at 3.5 liters at home, I called Corene Cornea with [facility] and requested that they accept the patient for [facility] services, He will check and let me know   Barriers to Discharge: Continued Medical Work up   Patient Goals and CMS Choice Patient states their goals for this hospitalization and ongoing recovery are:: to get stronger so I will be ok at home.   Choice offered to / list presented to : Patient  Discharge Placement                       Discharge Plan and Services     Post Acute Care Choice: Home Health                               Social Determinants of Health (SDOH) Interventions     Readmission Risk Interventions No flowsheet data found.

## 2019-07-15 NOTE — Discharge Instructions (Signed)
Community-Acquired Pneumonia, Adult Pneumonia is an infection of the lungs. It causes swelling in the airways of the lungs. Mucus and fluid may also build up inside the airways. One type of pneumonia can happen while a person is in a hospital. A different type can happen when a person is not in a hospital (community-acquired pneumonia).  What are the causes?  This condition is caused by germs (viruses, bacteria, or fungi). Some types of germs can be passed from one person to another. This can happen when you breathe in droplets from the cough or sneeze of an infected person. What increases the risk? You are more likely to develop this condition if you:  Have a long-term (chronic) disease, such as: ? Chronic obstructive pulmonary disease (COPD). ? Asthma. ? Cystic fibrosis. ? Congestive heart failure. ? Diabetes. ? Kidney disease.  Have HIV.  Have sickle cell disease.  Have had your spleen removed.  Do not take good care of your teeth and mouth (poor dental hygiene).  Have a medical condition that increases the risk of breathing in droplets from your own mouth and nose.  Have a weakened body defense system (immune system).  Are a smoker.  Travel to areas where the germs that cause this illness are common.  Are around certain animals or the places they live. What are the signs or symptoms?  A dry cough.  A wet (productive) cough.  Fever.  Sweating.  Chest pain. This often happens when breathing deeply or coughing.  Fast breathing or trouble breathing.  Shortness of breath.  Shaking chills.  Feeling tired (fatigue).  Muscle aches. How is this treated? Treatment for this condition depends on many things. Most adults can be treated at home. In some cases, treatment must happen in a hospital. Treatment may include:  Medicines given by mouth or through an IV tube.  Being given extra oxygen.  Respiratory therapy. In rare cases, treatment for very bad pneumonia  may include:  Using a machine to help you breathe.  Having a procedure to remove fluid from around your lungs. Follow these instructions at home: Medicines  Take over-the-counter and prescription medicines only as told by your doctor. ? Only take cough medicine if you are losing sleep.  If you were prescribed an antibiotic medicine, take it as told by your doctor. Do not stop taking the antibiotic even if you start to feel better. General instructions   Sleep with your head and neck raised (elevated). You can do this by sleeping in a recliner or by putting a few pillows under your head.  Rest as needed. Get at least 8 hours of sleep each night.  Drink enough water to keep your pee (urine) pale yellow.  Eat a healthy diet that includes plenty of vegetables, fruits, whole grains, low-fat dairy products, and lean protein.  Do not use any products that contain nicotine or tobacco. These include cigarettes, e-cigarettes, and chewing tobacco. If you need help quitting, ask your doctor.  Keep all follow-up visits as told by your doctor. This is important. How is this prevented? A shot (vaccine) can help prevent pneumonia. Shots are often suggested for:  People older than 84 years of age.  People older than 84 years of age who: ? Are having cancer treatment. ? Have long-term (chronic) lung disease. ? Have problems with their body's defense system. You may also prevent pneumonia if you take these actions:  Get the flu (influenza) shot every year.  Go to the dentist as   often as told.  Wash your hands often. If you cannot use soap and water, use hand sanitizer. Contact a doctor if:  You have a fever.  You lose sleep because your cough medicine does not help. Get help right away if:  You are short of breath and it gets worse.  You have more chest pain.  Your sickness gets worse. This is very serious if: ? You are an older adult. ? Your body's defense system is weak.  You  cough up blood. Summary  Pneumonia is an infection of the lungs.  Most adults can be treated at home. Some will need treatment in a hospital.  Drink enough water to keep your pee pale yellow.  Get at least 8 hours of sleep each night. This information is not intended to replace advice given to you by your health care provider. Make sure you discuss any questions you have with your health care provider. Document Revised: 10/20/2018 Document Reviewed: 02/25/2018 Elsevier Patient Education  2020 [employer]  

## 2019-07-15 NOTE — Progress Notes (Signed)
Pt in no distress, VSS. RN educated pt on discharge instructions and pt verbalized understanding. IVs removed. Pt wheeled to medical mall and assisted into sons vehicle.

## 2019-07-15 NOTE — Discharge Summary (Signed)
[facility] NAME: Margaret [name]    MR#:  [id number]  DATE OF BIRTH:  April 28, 1931  DATE OF ADMISSION:  07/11/2019 ADMITTING PHYSICIAN: Athena Masse, MD  DATE OF DISCHARGE: 07/15/19  PRIMARY CARE PHYSICIAN: Rusty Aus, MD    ADMISSION DIAGNOSIS:  Shortness of breath [R06.02] CAP (community acquired pneumonia) [J18.9] Pneumonia due to infectious organism, unspecified laterality, unspecified part of lung [J18.9]  DISCHARGE DIAGNOSIS:  Active Problems:   Community acquired pneumonia   Acute on chronic respiratory failure with hypoxia ([place])   COPD with acute exacerbation ([place])   Suspected COVID-19 virus infection   CAP (community acquired pneumonia)   Lobar pneumonia ([place])   Anemia   Lactic acidosis   Weight loss   Acute kidney injury superimposed on CKD ([place])   Impaired fasting glucose   Oral thrush   SECONDARY DIAGNOSIS:   Past Medical History:  Diagnosis Date  . Asthma without status asthmaticus   . COPD, severe ([place])   . Disc disease, degenerative, lumbar or lumbosacral   . DVT (deep venous thrombosis) ([place]) 2005  . Fibrocystic breast disease   . Gastric antral vascular ectasia   . History of uterine cancer   . HTN (hypertension)   . Hyperlipidemia   . Osteoporosis     HOSPITAL COURSE:   1.  Acute on chronic hypoxic respiratory failure.  The patient chronically wears 3-1/2 L of oxygen.  The patient is hesitant on going down any further on this.  Patient very adamant about this.  We did have her down to 2-1/2 L yesterday. 2.  Lobar pneumonia, elevated procalcitonin and leukocytosis.  The patient was placed on Rocephin and Zithromax.  Patient finished Zithromax course during the hospital course I will give a few more days of Omnicef upon going home.  White blood cell count still elevated secondary to steroids. 3.  COPD exacerbation.  The patient was given nebulizer treatments.  She was given Solu-Medrol during the  hospital course and will finish up prednisone prior to discharge.  Prescribed Dulera inhaler.  Patient already on Spiriva and albuterol. 4.  Iron deficiency anemia and weight loss.  The patient is guaiac positive which goes along with a chronic blood loss anemia.  CT scan of the chest abdomen pelvis was negative for any cancerous process.  The patient states that she does not believe that she can tolerate any GI procedures at this time.  I agree with this assessment.  I did give her IV iron on this hospital course.  Continue oral iron as outpatient.  Refer back to Dr. Rogue Bussing for monthly IV iron treatments.  Hemoglobin upon discharge 8.4.  The patient did not require a transfusion during the hospital course. 5.  Lactic acidosis.  Improved with IV fluids 6.  Depression anxiety on Zoloft and Xanax 7.  Hyperlipidemia unspecified on pravastatin 8.  Constipation continue stool softeners 9.  Weight loss.  CT scan of the chest abdomen pelvis negative for cancerous process 10.  Acute kidney injury with underlying chronic kidney disease stage IIIb 11.  Impaired fasting glucose.  Hemoglobin A1c 5.7.  Patient is not a diabetic at this time 64.  Thrush and mouth ulcers.  Continue nystatin swish and swallow 13.  Home health set up  DISCHARGE CONDITIONS:   Fair  CONSULTS OBTAINED:  None  DRUG ALLERGIES:   Allergies  Allergen Reactions  . Codeine Nausea And Vomiting  . Penicillins Rash  . Sulfa  Antibiotics Rash    DISCHARGE MEDICATIONS:   Allergies as of 07/15/2019      Reactions   Codeine Nausea And Vomiting   Penicillins Rash   Sulfa Antibiotics Rash      Medication List    TAKE these medications   ALPRAZolam 0.5 MG tablet Commonly known as: XANAX Take 0.5 mg by mouth 3 (three) times daily as needed for anxiety.   Bitoin Plus/Calcium/Vit D3 Tabs Take 1 tablet by mouth daily.   cefdinir 300 MG capsule Commonly known as: OMNICEF Take 1 capsule (300 mg total) by mouth daily for 3  days. Start taking on: July 16, 2019   Cholecalciferol 25 MCG (1000 UT) tablet Take 2,000 Units by mouth daily.   clobetasol cream 0.05 % Commonly known as: TEMOVATE Apply 1 application topically See admin instructions. Apply to itchy areas on legs as directed.   Coenzyme Q10 10 MG capsule Take 10 mg by mouth daily.   docusate sodium 50 MG capsule Commonly known as: COLACE Take 50 mg by mouth daily.   EPINEPHrine 0.3 mg/0.3 mL Soaj injection Commonly known as: EPI-PEN   esomeprazole 20 MG capsule Commonly known as: NEXIUM Take 20 mg by mouth daily at 12 noon.   ferrous sulfate 325 (65 FE) MG tablet Take 325 mg by mouth daily.   HYDROcodone-acetaminophen 5-325 MG tablet Commonly known as: NORCO/VICODIN Take 1 tablet by mouth every 6 (six) hours as needed for moderate pain.   ipratropium-albuterol 0.5-2.5 (3) MG/3ML Soln Commonly known as: DUONEB PLACE 1 VIAL IN NEBULIZER AND INHALE 3 TIMES A DAY   loratadine 10 MG tablet Commonly known as: CLARITIN Take 10 mg by mouth daily.   lovastatin 40 MG tablet Commonly known as: MEVACOR Take 40 mg by mouth at bedtime.   mometasone-formoterol 100-5 MCG/ACT Aero Commonly known as: DULERA Inhale 2 puffs into the lungs 2 (two) times daily.   NON FORMULARY Vein/vascular vitamin-1 tablet once daily   nystatin 100000 UNIT/ML suspension Commonly known as: MYCOSTATIN Take 5 mLs (500,000 Units total) by mouth 4 (four) times daily for 10 days.   ProAir HFA 108 (90 Base) MCG/ACT inhaler Generic drug: albuterol Inhale 1-2 puffs into the lungs every 6 (six) hours as needed for wheezing or shortness of breath.   Proctosol HC 2.5 % rectal cream Generic drug: hydrocortisone Place 1 application rectally See admin instructions. Apply to the affected areas three times a day as directed.   sertraline 50 MG tablet Commonly known as: ZOLOFT Take 50 mg by mouth daily.   Spiriva HandiHaler 18 MCG inhalation capsule Generic drug:  tiotropium Take 1 Inhaler by mouth as needed for shortness of breath.   traMADol 50 MG tablet Commonly known as: ULTRAM Take 50 mg by mouth.   Turmeric Complex/Black Pepper 3-500 MG Caps Generic drug: Black Pepper-Turmeric Take 500 mg by mouth daily.        DISCHARGE INSTRUCTIONS:   Follow-up PMD 5 days Follow-up Dr. Rogue Bussing oncology for iron infusions in a few weeks  If you experience worsening of your admission symptoms, develop shortness of breath, life threatening emergency, suicidal or homicidal thoughts you must seek medical attention immediately by calling 911 or calling your MD immediately  if symptoms less severe.  You Must read complete instructions/literature along with all the possible adverse reactions/side effects for all the Medicines you take and that have been prescribed to you. Take any new Medicines after you have completely understood and accept all the possible adverse reactions/side effects.  Please note  You were cared for by a hospitalist during your hospital stay. If you have any questions about your discharge medications or the care you received while you were in the hospital after you are discharged, you can call the unit and asked to speak with the hospitalist on call if the hospitalist that took care of you is not available. Once you are discharged, your primary care physician will handle any further medical issues. Please note that NO REFILLS for any discharge medications will be authorized once you are discharged, as it is imperative that you return to your primary care physician (or establish a relationship with a primary care physician if you do not have one) for your aftercare needs so that they can reassess your need for medications and monitor your lab values.    Today   CHIEF COMPLAINT:   Chief Complaint  Patient presents with  . Shortness of Breath  . Weakness    HISTORY OF PRESENT ILLNESS:  Margaret [name]  is a 86 y.o. female presented  with shortness of breath and weakness   VITAL SIGNS:  Blood pressure (!) 149/62, pulse 76, temperature 98.3 F (36.8 C), temperature source Oral, resp. rate 17, height 5\' 1"  (1.549 m), weight 44.9 kg, SpO2 98 %.    PHYSICAL EXAMINATION:  GENERAL:  86 y.o.-year-old patient lying in the bed with no acute distress.  EYES: Pupils equal, round, reactive to light and accommodation. No scleral icterus. Extraocular muscles intact.  HEENT: Head atraumatic, normocephalic. Oropharynx and nasopharynx clear.  NECK:  Supple, no jugular venous distention. No thyroid enlargement, no tenderness.  LUNGS: Normal breath sounds bilaterally, no wheezing, rales,rhonchi or crepitation. No use of accessory muscles of respiration.  CARDIOVASCULAR: S1, S2 normal. No murmurs, rubs, or gallops.  ABDOMEN: Soft, non-tender, non-distended. Bowel sounds present. No organomegaly or mass.  EXTREMITIES: No pedal edema, cyanosis, or clubbing.  NEUROLOGIC: Cranial nerves II through XII are intact. Muscle strength 5/5 in all extremities. Sensation intact. Gait not checked.  PSYCHIATRIC: The patient is alert and oriented x 3.  SKIN: No obvious rash, lesion, or ulcer.   DATA REVIEW:   CBC Recent Labs  Lab 07/15/19 0624  WBC 20.3*  HGB 8.4*  HCT 28.2*  PLT 297    Chemistries  Recent Labs  Lab 07/11/19 1840 07/14/19 0744  NA 141 142  K 3.7 3.8  CL 100 106  CO2 28 29  GLUCOSE 138* 113*  BUN 34* 32*  CREATININE 1.33* 1.13*  CALCIUM 8.5* 7.8*  AST 22  --   ALT 15  --   ALKPHOS 81  --   BILITOT 0.7  --     Microbiology Results  Results for orders placed or performed during the hospital encounter of 07/11/19  Blood Culture (routine x 2)     Status: None (Preliminary result)   Collection Time: 07/11/19  6:40 PM   Specimen: BLOOD  Result Value Ref Range Status   Specimen Description BLOOD BLOOD LEFT FOREARM  Final   Special Requests   Final    BOTTLES DRAWN AEROBIC AND ANAEROBIC Blood Culture adequate  volume   Culture   Final    NO GROWTH 4 DAYS Performed at [facility], [address], [place], [place] [address]    Report Status PENDING  Incomplete  Blood Culture (routine x 2)     Status: None (Preliminary result)   Collection Time: 07/11/19  6:41 PM   Specimen: BLOOD  Result Value Ref Range  Status   Specimen Description BLOOD  Final   Special Requests NONE  Final   Culture   Final    NO GROWTH 4 DAYS Performed at [facility], [address] [place], [place] [address]    Report Status PENDING  Incomplete  Respiratory Panel by RT PCR (Flu A&B, Covid) - Nasopharyngeal Swab     Status: None   Collection Time: 07/11/19  6:42 PM   Specimen: Nasopharyngeal Swab  Result Value Ref Range Status   SARS Coronavirus 2 by RT PCR NEGATIVE NEGATIVE Final    Comment: (NOTE) SARS-CoV-2 target nucleic acids are NOT DETECTED. The SARS-CoV-2 RNA is generally detectable in upper respiratoy specimens during the acute phase of infection. The lowest concentration of SARS-CoV-2 viral copies this assay can detect is 131 copies/mL. A negative result does not preclude SARS-Cov-2 infection and should not be used as the sole basis for treatment or other patient management decisions. A negative result may occur with  improper specimen collection/handling, submission of specimen other than nasopharyngeal swab, presence of viral mutation(s) within the areas targeted by this assay, and inadequate number of viral copies (<131 copies/mL). A negative result must be combined with clinical observations, patient history, and epidemiological information. The expected result is Negative. Fact Sheet for Patients:  PinkCheek.be Fact Sheet for Healthcare Providers:  GravelBags.it This test is not yet ap proved or cleared by the [place] FDA and  has been authorized for detection and/or diagnosis of SARS-CoV-2 by FDA under an  Emergency Use Authorization (EUA). This EUA will remain  in effect (meaning this test can be used) for the duration of the COVID-19 declaration under Section 564(b)(1) of the Act, 21 U.S.C. section 360bbb-3(b)(1), unless the authorization is terminated or revoked sooner.    Influenza A by PCR NEGATIVE NEGATIVE Final   Influenza B by PCR NEGATIVE NEGATIVE Final    Comment: (NOTE) The Xpert Xpress SARS-CoV-2/FLU/RSV assay is intended as an aid in  the diagnosis of influenza from Nasopharyngeal swab specimens and  should not be used as a sole basis for treatment. Nasal washings and  aspirates are unacceptable for Xpert Xpress SARS-CoV-2/FLU/RSV  testing. Fact Sheet for Patients: PinkCheek.be Fact Sheet for Healthcare Providers: GravelBags.it This test is not yet approved or cleared by the [place] FDA and  has been authorized for detection and/or diagnosis of SARS-CoV-2 by  FDA under an Emergency Use Authorization (EUA). This EUA will remain  in effect (meaning this test can be used) for the duration of the  Covid-19 declaration under Section 564(b)(1) of the Act, 21  U.S.C. section 360bbb-3(b)(1), unless the authorization is  terminated or revoked. Performed at [facility], [address] [place], [place] [address]   Urine culture     Status: None   Collection Time: 07/12/19  1:59 AM   Specimen: In/Out Cath Urine  Result Value Ref Range Status   Specimen Description   Final    IN/OUT CATH URINE Performed at [facility], [address] [place], [place] [address]    Special Requests   Final    NONE Performed at [facility], [address] [place], [place] [address]    Culture   Final    NO GROWTH Performed at [facility], [place] [address] [place], [place] [address]    Report Status 07/12/2019 FINAL  Final    RADIOLOGY:  CT ANGIO CHEST PE W OR WO CONTRAST  Result Date:  07/13/2019 CLINICAL DATA:  86 year old female with acute on chronic hypoxic respiratory failure. Shortness of breath. EXAM: CT ANGIOGRAPHY CHEST, ABDOMEN AND PELVIS TECHNIQUE: Multidetector CT imaging through the chest, abdomen and pelvis was performed using the standard protocol during bolus administration of intravenous contrast. Multiplanar reconstructed images and MIPs were obtained and reviewed to evaluate the vascular anatomy. CONTRAST:  24mL OMNIPAQUE IOHEXOL 350 MG/ML SOLN COMPARISON:  Chest radiograph dated 07/11/2019 and CT dated 08/17/2015. FINDINGS: Evaluation of this exam is limited due to respiratory motion artifact. Evaluation is also limited due to streak artifact caused by patient's arms. CTA CHEST FINDINGS Cardiovascular: There is no cardiomegaly or pericardial effusion. Advanced atherosclerotic calcification of the thoracic aorta. No aneurysmal dilatation or dissection. The origins of the great vessels of the aortic arch appear patent as visualized. Evaluation of the pulmonary arteries is limited due to respiratory motion artifact and suboptimal opacification and visualization of the peripheral branches. No large or central pulmonary artery embolus identified. Mediastinum/Nodes: No hilar or mediastinal adenopathy. The esophagus is grossly unremarkable. No mediastinal fluid collection. Lungs/Pleura: There is background of moderate to severe emphysema. Large areas of bilateral lower lobe consolidative changes most consistent with pneumonia, possibly aspiration. Clinical correlation is recommended. Small bilateral pleural effusions noted. Mucous content noted in the right bronchus intermedius and right lower lobe bronchus. No pneumothorax. Musculoskeletal: Osteopenia with degenerative changes of the spine. No acute osseous pathology. Review of the MIP images confirms the above findings. CTA ABDOMEN AND PELVIS FINDINGS VASCULAR Aorta: Advanced aortoiliac atherosclerotic disease. No aneurysmal  dilatation or dissection. Celiac: Patent without evidence of aneurysm, dissection, vasculitis or significant stenosis. SMA: Patent without evidence of aneurysm, dissection, vasculitis or significant stenosis. Renals: Atherosclerotic calcification of the origins of the renal arteries. The renal arteries remain patent. IMA: Patent without evidence of aneurysm, dissection, vasculitis or significant stenosis. Inflow: Atherosclerotic calcification of the iliac arteries. The iliac arteries are patent. Veins: The IVC is unremarkable. The SMV, splenic vein, and main portal vein are patent. No portal venous gas. Review of the MIP images confirms the above findings. NON-VASCULAR No intra-abdominal free air or free fluid Hepatobiliary: The liver is unremarkable. There is mild intrahepatic biliary ductal dilatation, likely post cholecystectomy. Pancreas: Unremarkable. No pancreatic ductal dilatation or surrounding inflammatory changes. Spleen: Normal in size without focal abnormality. Adrenals/Urinary Tract: The adrenal glands are unremarkable. Mild bilateral renal parenchyma atrophy. There is no hydronephrosis on either side. There is symmetric enhancement and excretion of contrast by both kidneys. The visualized ureters and urinary bladder appear unremarkable. Stomach/Bowel: There is no bowel obstruction or active inflammation. Small scattered sigmoid diverticula without active inflammatory changes noted. Appendectomy. Lymphatic: No adenopathy. Reproductive: Hysterectomy. No adnexal masses. Other: Diffuse subcutaneous edema. No fluid collection. Musculoskeletal: Osteopenia with degenerative changes of the spine. No acute osseous pathology. Bilateral L5 pars defects with grade 2 L5-S1 anterolisthesis. Review of the MIP images confirms the above findings. IMPRESSION: 1. Bilateral lower lobe consolidative changes consistent with pneumonia, and possibly aspiration. 2. Mucous content noted in the bronchus intermedius and right  lower lobe bronchus. 3. No CT evidence of central pulmonary artery embolus. 4. No acute intra-abdominopelvic pathology. 5. Aortic Atherosclerosis (ICD10-I70.0) and Emphysema (ICD10-J43.9). Electronically Signed   By: Anner Crete M.D.   On: 07/13/2019 15:10   CT ABDOMEN PELVIS W CONTRAST  Result Date: 07/13/2019 CLINICAL DATA:  86 year old female with acute on chronic hypoxic respiratory failure. Shortness of breath. EXAM: CT ANGIOGRAPHY CHEST, ABDOMEN AND PELVIS TECHNIQUE: Multidetector CT imaging through the chest, abdomen and pelvis was performed  using the standard protocol during bolus administration of intravenous contrast. Multiplanar reconstructed images and MIPs were obtained and reviewed to evaluate the vascular anatomy. CONTRAST:  37mL OMNIPAQUE IOHEXOL 350 MG/ML SOLN COMPARISON:  Chest radiograph dated 07/11/2019 and CT dated 08/17/2015. FINDINGS: Evaluation of this exam is limited due to respiratory motion artifact. Evaluation is also limited due to streak artifact caused by patient's arms. CTA CHEST FINDINGS Cardiovascular: There is no cardiomegaly or pericardial effusion. Advanced atherosclerotic calcification of the thoracic aorta. No aneurysmal dilatation or dissection. The origins of the great vessels of the aortic arch appear patent as visualized. Evaluation of the pulmonary arteries is limited due to respiratory motion artifact and suboptimal opacification and visualization of the peripheral branches. No large or central pulmonary artery embolus identified. Mediastinum/Nodes: No hilar or mediastinal adenopathy. The esophagus is grossly unremarkable. No mediastinal fluid collection. Lungs/Pleura: There is background of moderate to severe emphysema. Large areas of bilateral lower lobe consolidative changes most consistent with pneumonia, possibly aspiration. Clinical correlation is recommended. Small bilateral pleural effusions noted. Mucous content noted in the right bronchus intermedius  and right lower lobe bronchus. No pneumothorax. Musculoskeletal: Osteopenia with degenerative changes of the spine. No acute osseous pathology. Review of the MIP images confirms the above findings. CTA ABDOMEN AND PELVIS FINDINGS VASCULAR Aorta: Advanced aortoiliac atherosclerotic disease. No aneurysmal dilatation or dissection. Celiac: Patent without evidence of aneurysm, dissection, vasculitis or significant stenosis. SMA: Patent without evidence of aneurysm, dissection, vasculitis or significant stenosis. Renals: Atherosclerotic calcification of the origins of the renal arteries. The renal arteries remain patent. IMA: Patent without evidence of aneurysm, dissection, vasculitis or significant stenosis. Inflow: Atherosclerotic calcification of the iliac arteries. The iliac arteries are patent. Veins: The IVC is unremarkable. The SMV, splenic vein, and main portal vein are patent. No portal venous gas. Review of the MIP images confirms the above findings. NON-VASCULAR No intra-abdominal free air or free fluid Hepatobiliary: The liver is unremarkable. There is mild intrahepatic biliary ductal dilatation, likely post cholecystectomy. Pancreas: Unremarkable. No pancreatic ductal dilatation or surrounding inflammatory changes. Spleen: Normal in size without focal abnormality. Adrenals/Urinary Tract: The adrenal glands are unremarkable. Mild bilateral renal parenchyma atrophy. There is no hydronephrosis on either side. There is symmetric enhancement and excretion of contrast by both kidneys. The visualized ureters and urinary bladder appear unremarkable. Stomach/Bowel: There is no bowel obstruction or active inflammation. Small scattered sigmoid diverticula without active inflammatory changes noted. Appendectomy. Lymphatic: No adenopathy. Reproductive: Hysterectomy. No adnexal masses. Other: Diffuse subcutaneous edema. No fluid collection. Musculoskeletal: Osteopenia with degenerative changes of the spine. No acute  osseous pathology. Bilateral L5 pars defects with grade 2 L5-S1 anterolisthesis. Review of the MIP images confirms the above findings. IMPRESSION: 1. Bilateral lower lobe consolidative changes consistent with pneumonia, and possibly aspiration. 2. Mucous content noted in the bronchus intermedius and right lower lobe bronchus. 3. No CT evidence of central pulmonary artery embolus. 4. No acute intra-abdominopelvic pathology. 5. Aortic Atherosclerosis (ICD10-I70.0) and Emphysema (ICD10-J43.9). Electronically Signed   By: Anner Crete M.D.   On: 07/13/2019 15:10     Management plans discussed with the patient, family and they are in agreement.  CODE STATUS:     Code Status Orders  (From admission, onward)         Start     Ordered   07/11/19 2331  Full code  Continuous     07/11/19 2335        Code Status History    This patient has a  current code status but no historical code status.   Advance Care Planning Activity      TOTAL TIME TAKING CARE OF THIS PATIENT: 35 minutes.    Loletha Grayer M.D on 07/15/2019 at 1:56 PM  Between 7am to 6pm - Pager - [phone]  After 6pm go to [email or web address] - password [email or web address]  [facility]  CC: Primary care physician; Rusty Aus, MD

## 2019-07-15 NOTE — TOC Progression Note (Signed)
Transition of Care [facility]) - Progression Note    Patient Details  Name: Margaret [name] MRN: [id number] Date of Birth: February 02, 1931  Transition of [facility] CM/SW Contact  Su Hilt, RN Phone Number: 07/15/2019, 11:09 AM  Clinical Narrative:     Corene Cornea with [facility] alerted me that they will not be able to take the patient, I contacted Tommi Rumps with [facility] and they will be able to accept the patient for [facility] services  Expected Discharge Plan: [place] Barriers to Discharge: Continued Medical Work up  Expected Discharge Plan and Services Expected Discharge Plan: [place] Choice: [place] arrangements for the past 2 months: Single Family Home Expected Discharge Date: 07/15/19                                     Social Determinants of Health (SDOH) Interventions    Readmission Risk Interventions No flowsheet data found.

## 2019-07-16 LAB — CULTURE, BLOOD (ROUTINE X 2)
Culture: NO GROWTH
Culture: NO GROWTH
Special Requests: ADEQUATE

## 2019-07-21 ENCOUNTER — Other Ambulatory Visit: Payer: Self-pay | Admitting: *Deleted

## 2019-07-21 DIAGNOSIS — D500 Iron deficiency anemia secondary to blood loss (chronic): Secondary | ICD-10-CM

## 2019-07-25 ENCOUNTER — Telehealth: Payer: Self-pay | Admitting: *Deleted

## 2019-07-25 NOTE — Telephone Encounter (Signed)
cnl apts in c.ctr. tom. due to fevers. patient contacting pcp. recently d/c from [facility] with pneumonia.

## 2019-07-26 ENCOUNTER — Inpatient Hospital Stay: Payer: Medicare Other | Admitting: Internal Medicine

## 2019-07-26 ENCOUNTER — Inpatient Hospital Stay: Payer: Medicare Other

## 2019-08-10 ENCOUNTER — Other Ambulatory Visit: Payer: Self-pay | Admitting: Internal Medicine

## 2019-08-18 ENCOUNTER — Other Ambulatory Visit: Payer: Self-pay

## 2019-08-18 ENCOUNTER — Encounter: Payer: Self-pay | Admitting: Internal Medicine

## 2019-08-18 NOTE — Progress Notes (Signed)
Patient prescreened she reports that she tested positive for COVID 2 weeks ago after being treated for Covid by Dr. Ouida Sills for two weeks. She completed a course of Levequin. She is asymptomatic.

## 2019-08-19 ENCOUNTER — Other Ambulatory Visit: Payer: Self-pay

## 2019-08-19 ENCOUNTER — Inpatient Hospital Stay (HOSPITAL_BASED_OUTPATIENT_CLINIC_OR_DEPARTMENT_OTHER): Payer: Medicare Other | Admitting: Internal Medicine

## 2019-08-19 ENCOUNTER — Inpatient Hospital Stay: Payer: Medicare Other | Attending: Internal Medicine

## 2019-08-19 ENCOUNTER — Inpatient Hospital Stay: Payer: Medicare Other

## 2019-08-19 VITALS — BP 123/73 | HR 97 | Temp 97.7°F

## 2019-08-19 DIAGNOSIS — D500 Iron deficiency anemia secondary to blood loss (chronic): Secondary | ICD-10-CM

## 2019-08-19 DIAGNOSIS — Q2730 Arteriovenous malformation, site unspecified: Secondary | ICD-10-CM | POA: Diagnosis not present

## 2019-08-19 DIAGNOSIS — R0602 Shortness of breath: Secondary | ICD-10-CM | POA: Diagnosis not present

## 2019-08-19 LAB — CBC WITH DIFFERENTIAL/PLATELET
Abs Immature Granulocytes: 0.1 10*3/uL — ABNORMAL HIGH (ref 0.00–0.07)
Basophils Absolute: 0 10*3/uL (ref 0.0–0.1)
Basophils Relative: 0 %
Eosinophils Absolute: 0 10*3/uL (ref 0.0–0.5)
Eosinophils Relative: 0 %
HCT: 33.7 % — ABNORMAL LOW (ref 36.0–46.0)
Hemoglobin: 9.9 g/dL — ABNORMAL LOW (ref 12.0–15.0)
Immature Granulocytes: 1 %
Lymphocytes Relative: 8 %
Lymphs Abs: 0.8 10*3/uL (ref 0.7–4.0)
MCH: 26.6 pg (ref 26.0–34.0)
MCHC: 29.4 g/dL — ABNORMAL LOW (ref 30.0–36.0)
MCV: 90.6 fL (ref 80.0–100.0)
Monocytes Absolute: 3.2 10*3/uL — ABNORMAL HIGH (ref 0.1–1.0)
Monocytes Relative: 34 %
Neutro Abs: 5.3 10*3/uL (ref 1.7–7.7)
Neutrophils Relative %: 57 %
Platelets: 240 10*3/uL (ref 150–400)
RBC: 3.72 MIL/uL — ABNORMAL LOW (ref 3.87–5.11)
RDW: 20.4 % — ABNORMAL HIGH (ref 11.5–15.5)
WBC: 9.4 10*3/uL (ref 4.0–10.5)
nRBC: 0 % (ref 0.0–0.2)

## 2019-08-19 LAB — SAMPLE TO BLOOD BANK

## 2019-08-19 MED ORDER — SODIUM CHLORIDE 0.9 % IV SOLN
Freq: Once | INTRAVENOUS | Status: AC
  Filled 2019-08-19: qty 250

## 2019-08-19 MED ORDER — IRON SUCROSE 20 MG/ML IV SOLN
200.0000 mg | Freq: Once | INTRAVENOUS | Status: AC
  Administered 2019-08-19: 200 mg via INTRAVENOUS
  Filled 2019-08-19: qty 10

## 2019-08-19 MED ORDER — SODIUM CHLORIDE 0.9 % IV SOLN
INTRAVENOUS | Status: DC
  Filled 2019-08-19: qty 250

## 2019-08-19 NOTE — Progress Notes (Signed)
[place] OFFICE PROGRESS NOTE  Patient Care Team: Rusty Aus, MD as PCP - General (Internal Medicine)   SUMMARY OF ONCOLOGIC HISTORY:  # 2005 [atleast] ANEMIA- Iron def; ;?  Other reasons; W/Platelet- N; mild monocytosis [~2.5]; NO BMbx;Hx of Procrit in past;  IV Venofer q 3 M;   # Unprovoked DVT [2005] currently not on anticoagulation; COPD on 0 2 [place]  INTERVAL HISTORY:   86 year-old female patient with above history chronic anemia likely attributed to iron deficiency/AV malformation/ ? MDS is here for follow-up.  The interim patient was diagnosed with Covid.  Treated outpatient.  Did not need any inpatient admission.  She continues to plan 3.4 L of oxygen.  On further work-up with PCP patient's hemoglobin has been on 8.;  Given the symptomatic anemia she was referred back to [place] for IV iron infusions.  Patient complains of worsening fatigue.  No worsening cough.  No chest pain.  Review of Systems  Constitutional: Positive for malaise/fatigue and weight loss. Negative for chills, diaphoresis and fever.  HENT: Negative for nosebleeds and sore throat.   Eyes: Negative for double vision.  Respiratory: Positive for cough, sputum production and shortness of breath. Negative for hemoptysis and wheezing.   Cardiovascular: Negative for chest pain, palpitations, orthopnea and leg swelling.  Gastrointestinal: Negative for abdominal pain, blood in stool, constipation, diarrhea, heartburn, melena, nausea and vomiting.  Genitourinary: Negative for dysuria, frequency and urgency.  Musculoskeletal: Negative for back pain and joint pain.  Skin: Negative.  Negative for itching and rash.  Neurological: Negative for dizziness, tingling, focal weakness, weakness and headaches.  Endo/Heme/Allergies: Does not bruise/bleed easily.  Psychiatric/Behavioral: Negative for depression. The patient is not nervous/anxious and does not have insomnia.      PAST MEDICAL HISTORY :  Past  Medical History:  Diagnosis Date  . Asthma without status asthmaticus   . COPD, severe ([place])   . Disc disease, degenerative, lumbar or lumbosacral   . DVT (deep venous thrombosis) ([place]) 2005  . Fibrocystic breast disease   . Gastric antral vascular ectasia   . History of uterine cancer   . HTN (hypertension)   . Hyperlipidemia   . Osteoporosis     PAST SURGICAL HISTORY :   Past Surgical History:  Procedure Laterality Date  . APPENDECTOMY    . CHOLECYSTECTOMY      FAMILY HISTORY :   Family History  Problem Relation Age of Onset  . Lung cancer Father     SOCIAL HISTORY:   Social History   Tobacco Use  . Smoking status: Former Smoker    Years: 25.00    Types: Cigarettes    Quit date: 12/05/1988    Years since quitting: 30.7  . Smokeless tobacco: Never Used  Substance Use Topics  . Alcohol use: Not on file  . Drug use: Not on file    ALLERGIES:  is allergic to codeine; penicillins; and sulfa antibiotics.  MEDICATIONS:  Current Outpatient Medications  Medication Sig Dispense Refill  . albuterol (PROAIR HFA) 108 (90 BASE) MCG/ACT inhaler Inhale 1-2 puffs into the lungs every 6 (six) hours as needed for wheezing or shortness of breath.     . ALPRAZolam (XANAX) 0.5 MG tablet Take 0.5 mg by mouth 3 (three) times daily as needed for anxiety.     . Black Pepper-Turmeric (TURMERIC COMPLEX/BLACK PEPPER) 3-500 MG CAPS Take 500 mg by mouth daily.    . Cholecalciferol 25 MCG (1000 UT) tablet Take 2,000 Units  by mouth daily.    . clobetasol cream (TEMOVATE) 1.61 % Apply 1 application topically See admin instructions. Apply to itchy areas on legs as directed.    . Coenzyme Q10 10 MG capsule Take 10 mg by mouth daily.    Marland Kitchen docusate sodium (COLACE) 50 MG capsule Take 50 mg by mouth daily.    Marland Kitchen EPINEPHrine 0.3 mg/0.3 mL IJ SOAJ injection     . esomeprazole (NEXIUM) 20 MG capsule Take 20 mg by mouth daily at 12 noon.     . ferrous sulfate 325 (65 FE) MG tablet Take 325 mg by mouth  daily.    Marland Kitchen HYDROcodone-acetaminophen (NORCO/VICODIN) 5-325 MG tablet Take 1 tablet by mouth every 6 (six) hours as needed for moderate pain.     . hydrocortisone (PROCTOSOL HC) 2.5 % rectal cream Place 1 application rectally See admin instructions. Apply to the affected areas three times a day as directed.    Marland Kitchen ipratropium-albuterol (DUONEB) 0.5-2.5 (3) MG/3ML SOLN PLACE 1 VIAL IN NEBULIZER AND INHALE 3 TIMES A DAY    . loratadine (CLARITIN) 10 MG tablet Take 10 mg by mouth daily.    Marland Kitchen lovastatin (MEVACOR) 40 MG tablet Take 40 mg by mouth at bedtime.     . mometasone-formoterol (DULERA) 100-5 MCG/ACT AERO Inhale 2 puffs into the lungs 2 (two) times daily. 13 g 0  . Multiple Vitamins-Minerals (BITOIN PLUS/CALCIUM/VIT D3) TABS Take 1 tablet by mouth daily.    . NON FORMULARY Vein/vascular vitamin-1 tablet once daily    . sertraline (ZOLOFT) 50 MG tablet Take 50 mg by mouth daily.     Marland Kitchen tiotropium (SPIRIVA HANDIHALER) 18 MCG inhalation capsule Take 1 Inhaler by mouth as needed for shortness of breath.    . traMADol (ULTRAM) 50 MG tablet Take 50 mg by mouth.      No current facility-administered medications for this visit.   Facility-Administered Medications Ordered in Other Visits  Medication Dose Route Frequency Provider Last Rate Last Admin  . 0.9 %  sodium chloride infusion   Intravenous Continuous Cammie Sickle, MD        PHYSICAL EXAMINATION: ECOG PERFORMANCE STATUS: 1 - Symptomatic but completely ambulatory  BP 123/73 (BP Location: Left Arm, Patient Position: Sitting, Cuff Size: Normal)   Pulse 97   Temp 97.7 F (36.5 C) (Tympanic)   SpO2 98% Comment: 4 liters  There were no vitals filed for this visit.  Physical Exam  Constitutional: She is oriented to person, place, and time.  Thin built frail appearing cachectic female patient.  She is in a wheelchair.  Accompanied by her son..  She is on home O2.  HENT:  Head: Normocephalic and atraumatic.  Mouth/Throat: Oropharynx  is clear and moist. No oropharyngeal exudate.  Eyes: Pupils are equal, round, and reactive to light.  Cardiovascular: Normal rate and regular rhythm.  Pulmonary/Chest: No respiratory distress. She has no wheezes.  Decreased breath sounds bilaterally.    Abdominal: Soft. Bowel sounds are normal. She exhibits no distension and no mass. There is no abdominal tenderness. There is no rebound and no guarding.  Musculoskeletal:        General: No tenderness or edema. Normal range of motion.     Cervical back: Normal range of motion and neck supple.  Neurological: She is alert and oriented to person, place, and time.  Skin: Skin is warm.  Psychiatric: Affect normal.     LABORATORY DATA:  I have reviewed the data as listed  Component Value Date/Time   NA 142 07/14/2019 0744   NA 139 12/19/2012 0850   K 3.8 07/14/2019 0744   K 4.0 12/19/2012 0850   CL 106 07/14/2019 0744   CL 103 12/19/2012 0850   CO2 29 07/14/2019 0744   CO2 29 12/19/2012 0850   GLUCOSE 113 (H) 07/14/2019 0744   GLUCOSE 116 (H) 12/19/2012 0850   BUN 32 (H) 07/14/2019 0744   BUN 16 12/19/2012 0850   CREATININE 1.13 (H) 07/14/2019 0744   CREATININE 1.16 12/19/2012 0850   CALCIUM 7.8 (L) 07/14/2019 0744   CALCIUM 8.8 12/19/2012 0850   PROT 8.0 07/11/2019 1840   ALBUMIN 3.9 07/11/2019 1840   AST 22 07/11/2019 1840   ALT 15 07/11/2019 1840   ALKPHOS 81 07/11/2019 1840   BILITOT 0.7 07/11/2019 1840   GFRNONAA 44 (L) 07/14/2019 0744   GFRNONAA 45 (L) 12/19/2012 0850   GFRAA 51 (L) 07/14/2019 0744   GFRAA 52 (L) 12/19/2012 0850    No results found for: SPEP, UPEP  Lab Results  Component Value Date   WBC 9.4 08/19/2019   NEUTROABS 5.3 08/19/2019   HGB 9.9 (L) 08/19/2019   HCT 33.7 (L) 08/19/2019   MCV 90.6 08/19/2019   PLT 240 08/19/2019      Chemistry      Component Value Date/Time   NA 142 07/14/2019 0744   NA 139 12/19/2012 0850   K 3.8 07/14/2019 0744   K 4.0 12/19/2012 0850   CL 106 07/14/2019  0744   CL 103 12/19/2012 0850   CO2 29 07/14/2019 0744   CO2 29 12/19/2012 0850   BUN 32 (H) 07/14/2019 0744   BUN 16 12/19/2012 0850   CREATININE 1.13 (H) 07/14/2019 0744   CREATININE 1.16 12/19/2012 0850      Component Value Date/Time   CALCIUM 7.8 (L) 07/14/2019 0744   CALCIUM 8.8 12/19/2012 0850   ALKPHOS 81 07/11/2019 1840   AST 22 07/11/2019 1840   ALT 15 07/11/2019 1840   BILITOT 0.7 07/11/2019 1840       ASSESSMENT & PLAN:  Iron deficiency anemia due to chronic blood loss # Chronic anemia- at least from 2005 as per patient. Iron deficiency Anemia to be due to AV malformation vs ? Low grade MDS [no bone marrow Bx].  Hemoglobin is 9.9.  Proceed with IV Venofer today.  #Covid infection-improved.  Question recent worsening of anemia secondary to Covid infection.  # COPD- on home O2 2.5 L stable.  # DISPOSITION: # proceed with venofer today # follow up in 1 month- MD; cbc/bmp;CRP- posisble venofer- Dr.B  Cc; Dr.Miller.    Cammie Sickle, MD 08/19/2019 4:21 PM

## 2019-08-19 NOTE — Assessment & Plan Note (Addendum)
#   Chronic anemia- at least from 2005 as per patient. Iron deficiency Anemia to be due to AV malformation vs ? Low grade MDS [no bone marrow Bx].  Hemoglobin is 9.9.  Proceed with IV Venofer today.  #Covid infection-improved.  Question recent worsening of anemia secondary to Covid infection.  # COPD- on home O2 2.5 L stable.  # DISPOSITION: # proceed with venofer today # follow up in 1 month- MD; cbc/bmp;CRP- posisble venofer- Dr.B  Cc; Dr.Miller.

## 2019-09-15 ENCOUNTER — Other Ambulatory Visit: Payer: Self-pay

## 2019-09-15 ENCOUNTER — Encounter: Payer: Self-pay | Admitting: Internal Medicine

## 2019-09-16 ENCOUNTER — Other Ambulatory Visit: Payer: Self-pay | Admitting: *Deleted

## 2019-09-16 ENCOUNTER — Inpatient Hospital Stay: Payer: Medicare Other

## 2019-09-16 ENCOUNTER — Other Ambulatory Visit: Payer: Self-pay

## 2019-09-16 ENCOUNTER — Inpatient Hospital Stay: Payer: Medicare Other | Attending: Internal Medicine

## 2019-09-16 ENCOUNTER — Inpatient Hospital Stay (HOSPITAL_BASED_OUTPATIENT_CLINIC_OR_DEPARTMENT_OTHER): Payer: Medicare Other | Admitting: Internal Medicine

## 2019-09-16 DIAGNOSIS — D500 Iron deficiency anemia secondary to blood loss (chronic): Secondary | ICD-10-CM

## 2019-09-16 DIAGNOSIS — D509 Iron deficiency anemia, unspecified: Secondary | ICD-10-CM | POA: Insufficient documentation

## 2019-09-16 LAB — CBC WITH DIFFERENTIAL/PLATELET
Abs Immature Granulocytes: 0.03 10*3/uL (ref 0.00–0.07)
Basophils Absolute: 0 10*3/uL (ref 0.0–0.1)
Basophils Relative: 0 %
Eosinophils Absolute: 0 10*3/uL (ref 0.0–0.5)
Eosinophils Relative: 0 %
HCT: 33 % — ABNORMAL LOW (ref 36.0–46.0)
Hemoglobin: 9.7 g/dL — ABNORMAL LOW (ref 12.0–15.0)
Immature Granulocytes: 0 %
Lymphocytes Relative: 20 %
Lymphs Abs: 1.6 10*3/uL (ref 0.7–4.0)
MCH: 26.9 pg (ref 26.0–34.0)
MCHC: 29.4 g/dL — ABNORMAL LOW (ref 30.0–36.0)
MCV: 91.7 fL (ref 80.0–100.0)
Monocytes Absolute: 2.9 10*3/uL — ABNORMAL HIGH (ref 0.1–1.0)
Monocytes Relative: 37 %
Neutro Abs: 3.4 10*3/uL (ref 1.7–7.7)
Neutrophils Relative %: 43 %
Platelets: 232 10*3/uL (ref 150–400)
RBC: 3.6 MIL/uL — ABNORMAL LOW (ref 3.87–5.11)
RDW: 19.6 % — ABNORMAL HIGH (ref 11.5–15.5)
Smear Review: ADEQUATE
WBC: 8 10*3/uL (ref 4.0–10.5)
nRBC: 0 % (ref 0.0–0.2)

## 2019-09-16 LAB — BASIC METABOLIC PANEL
Anion gap: 7 (ref 5–15)
BUN: 19 mg/dL (ref 8–23)
CO2: 28 mmol/L (ref 22–32)
Calcium: 8.9 mg/dL (ref 8.9–10.3)
Chloride: 103 mmol/L (ref 98–111)
Creatinine, Ser: 0.97 mg/dL (ref 0.44–1.00)
GFR calc Af Amer: >60 mL/min (ref >60)
GFR calc non Af Amer: 53 mL/min — ABNORMAL LOW (ref >60)
Glucose, Bld: 105 mg/dL — ABNORMAL HIGH (ref 70–99)
Potassium: 5.2 mmol/L — ABNORMAL HIGH (ref 3.5–5.1)
Sodium: 138 mmol/L (ref 135–145)

## 2019-09-16 LAB — SAMPLE TO BLOOD BANK

## 2019-09-16 LAB — C-REACTIVE PROTEIN: CRP: 0.6 mg/dL (ref <1.0)

## 2019-09-16 MED ORDER — SODIUM CHLORIDE 0.9 % IV SOLN
Freq: Once | INTRAVENOUS | Status: AC
  Filled 2019-09-16: qty 250

## 2019-09-16 MED ORDER — IRON SUCROSE 20 MG/ML IV SOLN
200.0000 mg | Freq: Once | INTRAVENOUS | Status: AC
  Administered 2019-09-16: 200 mg via INTRAVENOUS
  Filled 2019-09-16: qty 10

## 2019-09-16 NOTE — Assessment & Plan Note (Addendum)
#   Chronic anemia- at least from 2005 as per patient. Iron deficiency Anemia to be due to AV malformation vs ? Low grade MDS [no bone marrow Bx].  Hemoglobin is 9.9.  Proceed with IV Venofer today.  #Recent Covid infection-improved; no evidence of any complications.  # COPD- on home O2 2.5 L stable.  # DISPOSITION: # proceed with venofer today # follow up in 6 weeks [no fridays]- MD; cbc/bmp; iron studies/ferritin--posisble venofer- Dr.B  Cc; Dr.Miller.

## 2019-09-16 NOTE — Progress Notes (Signed)
[place] OFFICE PROGRESS NOTE  Patient Care Team: Rusty Aus, MD as PCP - General (Internal Medicine)   SUMMARY OF ONCOLOGIC HISTORY:  # 2005 [atleast] ANEMIA- Iron def; ;?  Other reasons; W/Platelet- N; mild monocytosis [~2.5]; NO BMbx;Hx of Procrit in past;  IV Venofer q 3 M;   # Unprovoked DVT [2005] currently not on anticoagulation; COPD on 0 2 [place]  INTERVAL HISTORY:   86 year-old female patient with above history chronic anemia likely attributed to iron deficiency-question malabsorption/AV malformation/ ? MDS is here for follow-up.  Patient received Venofer-notes to have mild improvement of energy levels.   She continues to have chronic shortness of breath chronic cough.  Continues to be on oxygen 2 L.   Review of Systems  Constitutional: Positive for malaise/fatigue and weight loss. Negative for chills, diaphoresis and fever.  HENT: Negative for nosebleeds and sore throat.   Eyes: Negative for double vision.  Respiratory: Positive for cough, sputum production and shortness of breath. Negative for hemoptysis and wheezing.   Cardiovascular: Negative for chest pain, palpitations, orthopnea and leg swelling.  Gastrointestinal: Negative for abdominal pain, blood in stool, constipation, diarrhea, heartburn, melena, nausea and vomiting.  Genitourinary: Negative for dysuria, frequency and urgency.  Musculoskeletal: Negative for back pain and joint pain.  Skin: Negative.  Negative for itching and rash.  Neurological: Negative for dizziness, tingling, focal weakness, weakness and headaches.  Endo/Heme/Allergies: Does not bruise/bleed easily.  Psychiatric/Behavioral: Negative for depression. The patient is not nervous/anxious and does not have insomnia.      PAST MEDICAL HISTORY :  Past Medical History:  Diagnosis Date  . Asthma without status asthmaticus   . COPD, severe ([place])   . Disc disease, degenerative, lumbar or lumbosacral   . DVT (deep venous  thrombosis) ([place]) 2005  . Fibrocystic breast disease   . Gastric antral vascular ectasia   . History of uterine cancer   . HTN (hypertension)   . Hyperlipidemia   . Osteoporosis     PAST SURGICAL HISTORY :   Past Surgical History:  Procedure Laterality Date  . APPENDECTOMY    . CHOLECYSTECTOMY      FAMILY HISTORY :   Family History  Problem Relation Age of Onset  . Lung cancer Father     SOCIAL HISTORY:   Social History   Tobacco Use  . Smoking status: Former Smoker    Years: 25.00    Types: Cigarettes    Quit date: 12/05/1988    Years since quitting: 30.8  . Smokeless tobacco: Never Used  Substance Use Topics  . Alcohol use: Not on file  . Drug use: Not on file    ALLERGIES:  is allergic to codeine; penicillins; and sulfa antibiotics.  MEDICATIONS:  Current Outpatient Medications  Medication Sig Dispense Refill  . albuterol (PROAIR HFA) 108 (90 BASE) MCG/ACT inhaler Inhale 1-2 puffs into the lungs every 6 (six) hours as needed for wheezing or shortness of breath.     . ALPRAZolam (XANAX) 0.5 MG tablet Take 0.5 mg by mouth 3 (three) times daily as needed for anxiety.     . Black Pepper-Turmeric (TURMERIC COMPLEX/BLACK PEPPER) 3-500 MG CAPS Take 500 mg by mouth daily.    . Cholecalciferol 25 MCG (1000 UT) tablet Take 2,000 Units by mouth daily.    . clobetasol cream (TEMOVATE) 2.02 % Apply 1 application topically See admin instructions. Apply to itchy areas on legs as directed.    . Coenzyme Q10 10 MG  capsule Take 10 mg by mouth daily.    Marland Kitchen docusate sodium (COLACE) 50 MG capsule Take 50 mg by mouth daily.    Marland Kitchen esomeprazole (NEXIUM) 20 MG capsule Take 20 mg by mouth daily at 12 noon.     . ferrous sulfate 325 (65 FE) MG tablet Take 325 mg by mouth daily.    Marland Kitchen HYDROcodone-acetaminophen (NORCO/VICODIN) 5-325 MG tablet Take 1 tablet by mouth every 6 (six) hours as needed for moderate pain.     . hydrocortisone (PROCTOSOL HC) 2.5 % rectal cream Place 1 application  rectally See admin instructions. Apply to the affected areas three times a day as directed.    Marland Kitchen ipratropium-albuterol (DUONEB) 0.5-2.5 (3) MG/3ML SOLN PLACE 1 VIAL IN NEBULIZER AND INHALE 3 TIMES A DAY    . loratadine (CLARITIN) 10 MG tablet Take 10 mg by mouth daily.    Marland Kitchen lovastatin (MEVACOR) 40 MG tablet Take 40 mg by mouth at bedtime.     . mometasone-formoterol (DULERA) 100-5 MCG/ACT AERO Inhale 2 puffs into the lungs 2 (two) times daily. 13 g 0  . Multiple Vitamins-Minerals (BITOIN PLUS/CALCIUM/VIT D3) TABS Take 1 tablet by mouth daily.    . NON FORMULARY Vein/vascular vitamin-1 tablet once daily    . sertraline (ZOLOFT) 50 MG tablet Take 50 mg by mouth daily.     Marland Kitchen tiotropium (SPIRIVA HANDIHALER) 18 MCG inhalation capsule Take 1 Inhaler by mouth as needed for shortness of breath.    . traMADol (ULTRAM) 50 MG tablet Take 50 mg by mouth.     . EPINEPHrine 0.3 mg/0.3 mL IJ SOAJ injection      No current facility-administered medications for this visit.    PHYSICAL EXAMINATION: ECOG PERFORMANCE STATUS: 1 - Symptomatic but completely ambulatory  BP (!) 149/74 (BP Location: Left Arm, Patient Position: Sitting, Cuff Size: Small)   Pulse 97   Temp 97.6 F (36.4 C) (Tympanic)   Wt 93 lb 6.4 oz (42.4 kg)   SpO2 (!) 86%   BMI 17.65 kg/m   Filed Weights   09/16/19 1357  Weight: 93 lb 6.4 oz (42.4 kg)    Physical Exam  Constitutional: She is oriented to person, place, and time.  Thin built frail appearing cachectic female patient.  She is in a wheelchair.  Accompanied by her son..  She is on home O2.  HENT:  Head: Normocephalic and atraumatic.  Mouth/Throat: Oropharynx is clear and moist. No oropharyngeal exudate.  Eyes: Pupils are equal, round, and reactive to light.  Cardiovascular: Normal rate and regular rhythm.  Pulmonary/Chest: No respiratory distress. She has no wheezes.  Decreased breath sounds bilaterally.    Abdominal: Soft. Bowel sounds are normal. She exhibits no  distension and no mass. There is no abdominal tenderness. There is no rebound and no guarding.  Musculoskeletal:        General: No tenderness or edema. Normal range of motion.     Cervical back: Normal range of motion and neck supple.  Neurological: She is alert and oriented to person, place, and time.  Skin: Skin is warm.  Psychiatric: Affect normal.     LABORATORY DATA:  I have reviewed the data as listed    Component Value Date/Time   NA 138 09/16/2019 1343   NA 139 12/19/2012 0850   K 5.2 (H) 09/16/2019 1343   K 4.0 12/19/2012 0850   CL 103 09/16/2019 1343   CL 103 12/19/2012 0850   CO2 28 09/16/2019 1343   CO2 29  12/19/2012 0850   GLUCOSE 105 (H) 09/16/2019 1343   GLUCOSE 116 (H) 12/19/2012 0850   BUN 19 09/16/2019 1343   BUN 16 12/19/2012 0850   CREATININE 0.97 09/16/2019 1343   CREATININE 1.16 12/19/2012 0850   CALCIUM 8.9 09/16/2019 1343   CALCIUM 8.8 12/19/2012 0850   PROT 8.0 07/11/2019 1840   ALBUMIN 3.9 07/11/2019 1840   AST 22 07/11/2019 1840   ALT 15 07/11/2019 1840   ALKPHOS 81 07/11/2019 1840   BILITOT 0.7 07/11/2019 1840   GFRNONAA 53 (L) 09/16/2019 1343   GFRNONAA 45 (L) 12/19/2012 0850   GFRAA >60 09/16/2019 1343   GFRAA 52 (L) 12/19/2012 0850    No results found for: SPEP, UPEP  Lab Results  Component Value Date   WBC 8.0 09/16/2019   NEUTROABS 3.4 09/16/2019   HGB 9.7 (L) 09/16/2019   HCT 33.0 (L) 09/16/2019   MCV 91.7 09/16/2019   PLT 232 09/16/2019      Chemistry      Component Value Date/Time   NA 138 09/16/2019 1343   NA 139 12/19/2012 0850   K 5.2 (H) 09/16/2019 1343   K 4.0 12/19/2012 0850   CL 103 09/16/2019 1343   CL 103 12/19/2012 0850   CO2 28 09/16/2019 1343   CO2 29 12/19/2012 0850   BUN 19 09/16/2019 1343   BUN 16 12/19/2012 0850   CREATININE 0.97 09/16/2019 1343   CREATININE 1.16 12/19/2012 0850      Component Value Date/Time   CALCIUM 8.9 09/16/2019 1343   CALCIUM 8.8 12/19/2012 0850   ALKPHOS 81 07/11/2019  1840   AST 22 07/11/2019 1840   ALT 15 07/11/2019 1840   BILITOT 0.7 07/11/2019 1840       ASSESSMENT & PLAN:  Iron deficiency anemia due to chronic blood loss # Chronic anemia- at least from 2005 as per patient. Iron deficiency Anemia to be due to AV malformation vs ? Low grade MDS [no bone marrow Bx].  Hemoglobin is 9.9.  Proceed with IV Venofer today.  #Recent Covid infection-improved; no evidence of any complications.  # COPD- on home O2 2.5 L stable.  # DISPOSITION: # proceed with venofer today # follow up in 6 weeks [no fridays]- MD; cbc/bmp; iron studies/ferritin--posisble venofer- Dr.B  Cc; Dr.Miller.    Cammie Sickle, MD 09/19/2019 12:31 PM

## 2019-10-28 ENCOUNTER — Other Ambulatory Visit: Payer: Medicare Other

## 2019-10-28 ENCOUNTER — Ambulatory Visit: Payer: Medicare Other

## 2019-10-28 ENCOUNTER — Ambulatory Visit: Payer: Medicare Other | Admitting: Internal Medicine

## 2019-11-01 ENCOUNTER — Inpatient Hospital Stay: Payer: Medicare Other

## 2019-11-01 ENCOUNTER — Other Ambulatory Visit: Payer: Self-pay

## 2019-11-01 ENCOUNTER — Inpatient Hospital Stay: Payer: Medicare Other | Attending: Internal Medicine

## 2019-11-01 ENCOUNTER — Inpatient Hospital Stay (HOSPITAL_BASED_OUTPATIENT_CLINIC_OR_DEPARTMENT_OTHER): Payer: Medicare Other | Admitting: Internal Medicine

## 2019-11-01 VITALS — BP 139/66 | HR 91 | Temp 97.4°F | Resp 21 | Wt 95.1 lb

## 2019-11-01 VITALS — BP 146/68 | HR 81 | Temp 96.8°F | Resp 20

## 2019-11-01 DIAGNOSIS — D500 Iron deficiency anemia secondary to blood loss (chronic): Secondary | ICD-10-CM | POA: Diagnosis not present

## 2019-11-01 LAB — CBC WITH DIFFERENTIAL/PLATELET
Abs Immature Granulocytes: 0.06 10*3/uL (ref 0.00–0.07)
Basophils Absolute: 0 10*3/uL (ref 0.0–0.1)
Basophils Relative: 0 %
Eosinophils Absolute: 0 10*3/uL (ref 0.0–0.5)
Eosinophils Relative: 0 %
HCT: 33.1 % — ABNORMAL LOW (ref 36.0–46.0)
Hemoglobin: 10.4 g/dL — ABNORMAL LOW (ref 12.0–15.0)
Immature Granulocytes: 1 %
Lymphocytes Relative: 10 %
Lymphs Abs: 1.1 10*3/uL (ref 0.7–4.0)
MCH: 28.3 pg (ref 26.0–34.0)
MCHC: 31.4 g/dL (ref 30.0–36.0)
MCV: 89.9 fL (ref 80.0–100.0)
Monocytes Absolute: 3.8 10*3/uL — ABNORMAL HIGH (ref 0.1–1.0)
Monocytes Relative: 37 %
Neutro Abs: 5.2 10*3/uL (ref 1.7–7.7)
Neutrophils Relative %: 52 %
Platelets: 276 10*3/uL (ref 150–400)
RBC: 3.68 MIL/uL — ABNORMAL LOW (ref 3.87–5.11)
RDW: 17.3 % — ABNORMAL HIGH (ref 11.5–15.5)
Smear Review: NORMAL
WBC: 10.2 10*3/uL (ref 4.0–10.5)
nRBC: 0 % (ref 0.0–0.2)

## 2019-11-01 LAB — IRON AND TIBC
Iron: 58 ug/dL (ref 28–170)
Saturation Ratios: 18 % (ref 10.4–31.8)
TIBC: 316 ug/dL (ref 250–450)
UIBC: 258 ug/dL

## 2019-11-01 LAB — BASIC METABOLIC PANEL
Anion gap: 9 (ref 5–15)
BUN: 24 mg/dL — ABNORMAL HIGH (ref 8–23)
CO2: 27 mmol/L (ref 22–32)
Calcium: 8.9 mg/dL (ref 8.9–10.3)
Chloride: 107 mmol/L (ref 98–111)
Creatinine, Ser: 1.1 mg/dL — ABNORMAL HIGH (ref 0.44–1.00)
GFR calc Af Amer: 53 mL/min — ABNORMAL LOW (ref >60)
GFR calc non Af Amer: 45 mL/min — ABNORMAL LOW (ref >60)
Glucose, Bld: 135 mg/dL — ABNORMAL HIGH (ref 70–99)
Potassium: 3.9 mmol/L (ref 3.5–5.1)
Sodium: 143 mmol/L (ref 135–145)

## 2019-11-01 LAB — FERRITIN: Ferritin: 82 ng/mL (ref 11–307)

## 2019-11-01 MED ORDER — IRON SUCROSE 20 MG/ML IV SOLN
200.0000 mg | Freq: Once | INTRAVENOUS | Status: AC
  Administered 2019-11-01: 200 mg via INTRAVENOUS
  Filled 2019-11-01: qty 10

## 2019-11-01 MED ORDER — SODIUM CHLORIDE 0.9 % IV SOLN
Freq: Once | INTRAVENOUS | Status: AC
  Filled 2019-11-01: qty 250

## 2019-11-01 NOTE — Progress Notes (Signed)
[place] OFFICE PROGRESS NOTE  Patient Care Team: Rusty Aus, MD as PCP - General (Internal Medicine)   SUMMARY OF ONCOLOGIC HISTORY:  # 2005 [atleast] ANEMIA- Iron def; ;?  Other reasons; W/Platelet- N; mild monocytosis [~2.5]; NO BMbx;Hx of Procrit in past;  IV Venofer q 3 M;   # Unprovoked DVT [2005] currently not on anticoagulation; COPD on 0 2 [place]  INTERVAL HISTORY:   86 year-old female patient with above history chronic anemia likely attributed to iron deficiency-question malabsorption/AV malformation/ ? MDS is here for follow-up.  Patient states her energy levels are improved since receiving IV Venofer.  Continues to chronic shortness of breath chronic cough.  Continues with oxygen 2 L.    Review of Systems  Constitutional: Positive for malaise/fatigue and weight loss. Negative for chills, diaphoresis and fever.  HENT: Negative for nosebleeds and sore throat.   Eyes: Negative for double vision.  Respiratory: Positive for cough, sputum production and shortness of breath. Negative for hemoptysis and wheezing.   Cardiovascular: Negative for chest pain, palpitations, orthopnea and leg swelling.  Gastrointestinal: Negative for abdominal pain, blood in stool, constipation, diarrhea, heartburn, melena, nausea and vomiting.  Genitourinary: Negative for dysuria, frequency and urgency.  Musculoskeletal: Negative for back pain and joint pain.  Skin: Negative.  Negative for itching and rash.  Neurological: Negative for dizziness, tingling, focal weakness, weakness and headaches.  Endo/Heme/Allergies: Does not bruise/bleed easily.  Psychiatric/Behavioral: Negative for depression. The patient is not nervous/anxious and does not have insomnia.      PAST MEDICAL HISTORY :  Past Medical History:  Diagnosis Date  . Asthma without status asthmaticus   . COPD, severe ([place])   . Disc disease, degenerative, lumbar or lumbosacral   . DVT (deep venous thrombosis) ([place])  2005  . Fibrocystic breast disease   . Gastric antral vascular ectasia   . History of uterine cancer   . HTN (hypertension)   . Hyperlipidemia   . Osteoporosis     PAST SURGICAL HISTORY :   Past Surgical History:  Procedure Laterality Date  . APPENDECTOMY    . CHOLECYSTECTOMY      FAMILY HISTORY :   Family History  Problem Relation Age of Onset  . Lung cancer Father     SOCIAL HISTORY:   Social History   Tobacco Use  . Smoking status: Former Smoker    Years: 25.00    Types: Cigarettes    Quit date: 12/05/1988    Years since quitting: 30.9  . Smokeless tobacco: Never Used  Substance Use Topics  . Alcohol use: Not on file  . Drug use: Not on file    ALLERGIES:  is allergic to codeine; penicillins; and sulfa antibiotics.  MEDICATIONS:  Current Outpatient Medications  Medication Sig Dispense Refill  . albuterol (PROAIR HFA) 108 (90 BASE) MCG/ACT inhaler Inhale 1-2 puffs into the lungs every 6 (six) hours as needed for wheezing or shortness of breath.     . ALPRAZolam (XANAX) 0.5 MG tablet Take 0.5 mg by mouth 3 (three) times daily as needed for anxiety.     . Black Pepper-Turmeric (TURMERIC COMPLEX/BLACK PEPPER) 3-500 MG CAPS Take 500 mg by mouth daily.    . Cholecalciferol 25 MCG (1000 UT) tablet Take 2,000 Units by mouth daily.    . clobetasol cream (TEMOVATE) 6.21 % Apply 1 application topically See admin instructions. Apply to itchy areas on legs as directed.    . docusate sodium (COLACE) 50 MG capsule Take  50 mg by mouth daily.    Marland Kitchen EPINEPHrine 0.3 mg/0.3 mL IJ SOAJ injection     . esomeprazole (NEXIUM) 20 MG capsule Take 20 mg by mouth daily at 12 noon.     . ferrous sulfate 325 (65 FE) MG tablet Take 325 mg by mouth daily.    Marland Kitchen HYDROcodone-acetaminophen (NORCO/VICODIN) 5-325 MG tablet Take 1 tablet by mouth every 6 (six) hours as needed for moderate pain.     . hydrocortisone (PROCTOSOL HC) 2.5 % rectal cream Place 1 application rectally See admin instructions.  Apply to the affected areas three times a day as directed.    Marland Kitchen ipratropium-albuterol (DUONEB) 0.5-2.5 (3) MG/3ML SOLN PLACE 1 VIAL IN NEBULIZER AND INHALE 3 TIMES A DAY    . loratadine (CLARITIN) 10 MG tablet Take 10 mg by mouth daily.    Marland Kitchen lovastatin (MEVACOR) 40 MG tablet Take 40 mg by mouth at bedtime.     . mometasone-formoterol (DULERA) 100-5 MCG/ACT AERO Inhale 2 puffs into the lungs 2 (two) times daily. 13 g 0  . NON FORMULARY Vein/vascular vitamin-1 tablet once daily    . sertraline (ZOLOFT) 50 MG tablet Take 50 mg by mouth daily.     . traMADol (ULTRAM) 50 MG tablet Take 50 mg by mouth.     . Multiple Vitamins-Minerals (BITOIN PLUS/CALCIUM/VIT D3) TABS Take 1 tablet by mouth daily.    Marland Kitchen tiotropium (SPIRIVA HANDIHALER) 18 MCG inhalation capsule Take 1 Inhaler by mouth as needed for shortness of breath.     No current facility-administered medications for this visit.    PHYSICAL EXAMINATION: ECOG PERFORMANCE STATUS: 1 - Symptomatic but completely ambulatory  BP 139/66 (BP Location: Left Arm, Patient Position: Sitting)   Pulse 91   Temp (!) 97.4 F (36.3 C) (Tympanic)   Resp (!) 21   Wt 95 lb 1.6 oz (43.1 kg)   SpO2 99%   BMI 17.97 kg/m   Filed Weights   11/01/19 1331  Weight: 95 lb 1.6 oz (43.1 kg)    Physical Exam  Constitutional: She is oriented to person, place, and time.  Thin built frail appearing cachectic female patient.  She is in a wheelchair.  Patient alone.  She is on home O2.  HENT:  Head: Normocephalic and atraumatic.  Mouth/Throat: Oropharynx is clear and moist. No oropharyngeal exudate.  Eyes: Pupils are equal, round, and reactive to light.  Cardiovascular: Normal rate and regular rhythm.  Pulmonary/Chest: No respiratory distress. She has no wheezes.  Decreased breath sounds bilaterally.    Abdominal: Soft. Bowel sounds are normal. She exhibits no distension and no mass. There is no abdominal tenderness. There is no rebound and no guarding.   Musculoskeletal:        General: No tenderness or edema. Normal range of motion.     Cervical back: Normal range of motion and neck supple.  Neurological: She is alert and oriented to person, place, and time.  Skin: Skin is warm.  Psychiatric: Affect normal.     LABORATORY DATA:  I have reviewed the data as listed    Component Value Date/Time   NA 143 11/01/2019 1248   NA 139 12/19/2012 0850   K 3.9 11/01/2019 1248   K 4.0 12/19/2012 0850   CL 107 11/01/2019 1248   CL 103 12/19/2012 0850   CO2 27 11/01/2019 1248   CO2 29 12/19/2012 0850   GLUCOSE 135 (H) 11/01/2019 1248   GLUCOSE 116 (H) 12/19/2012 0850   BUN 24 (  H) 11/01/2019 1248   BUN 16 12/19/2012 0850   CREATININE 1.10 (H) 11/01/2019 1248   CREATININE 1.16 12/19/2012 0850   CALCIUM 8.9 11/01/2019 1248   CALCIUM 8.8 12/19/2012 0850   PROT 8.0 07/11/2019 1840   ALBUMIN 3.9 07/11/2019 1840   AST 22 07/11/2019 1840   ALT 15 07/11/2019 1840   ALKPHOS 81 07/11/2019 1840   BILITOT 0.7 07/11/2019 1840   GFRNONAA 45 (L) 11/01/2019 1248   GFRNONAA 45 (L) 12/19/2012 0850   GFRAA 53 (L) 11/01/2019 1248   GFRAA 52 (L) 12/19/2012 0850    No results found for: SPEP, UPEP  Lab Results  Component Value Date   WBC 10.2 11/01/2019   NEUTROABS 5.2 11/01/2019   HGB 10.4 (L) 11/01/2019   HCT 33.1 (L) 11/01/2019   MCV 89.9 11/01/2019   PLT 276 11/01/2019      Chemistry      Component Value Date/Time   NA 143 11/01/2019 1248   NA 139 12/19/2012 0850   K 3.9 11/01/2019 1248   K 4.0 12/19/2012 0850   CL 107 11/01/2019 1248   CL 103 12/19/2012 0850   CO2 27 11/01/2019 1248   CO2 29 12/19/2012 0850   BUN 24 (H) 11/01/2019 1248   BUN 16 12/19/2012 0850   CREATININE 1.10 (H) 11/01/2019 1248   CREATININE 1.16 12/19/2012 0850      Component Value Date/Time   CALCIUM 8.9 11/01/2019 1248   CALCIUM 8.8 12/19/2012 0850   ALKPHOS 81 07/11/2019 1840   AST 22 07/11/2019 1840   ALT 15 07/11/2019 1840   BILITOT 0.7 07/11/2019  1840       ASSESSMENT & PLAN:  Iron deficiency anemia due to chronic blood loss # Chronic anemia- at least from 2005 as per patient. Iron deficiency Anemia to be due to AV malformation vs ? Low grade MDS [no bone marrow Bx].  Hemoglobin is 10.4 .  Proceed with IV Venofer today.  Discussed with pharmacy-financial assistance unavailable.  # COPD- on home O2 2.5 L stable.  # DISPOSITION: # proceed with venofer today # follow up in 3 month [Tuesday PM]- MD; cbc/bmp; iron studies/ferritin--posisble venofer- Dr.B  Cc; Dr.Miller.    Cammie Sickle, MD 11/01/2019 1:50 PM

## 2019-11-01 NOTE — Progress Notes (Signed)
Pt in for follow up, oxygen on at 2 l/m.  Pt still has poor appetite.  Denies any concerns today.

## 2019-11-01 NOTE — Assessment & Plan Note (Signed)
#   Chronic anemia- at least from 2005 as per patient. Iron deficiency Anemia to be due to AV malformation vs ? Low grade MDS [no bone marrow Bx].  Hemoglobin is 10.4 .  Proceed with IV Venofer today.  Discussed with pharmacy-financial assistance unavailable.  # COPD- on home O2 2.5 L stable.  # DISPOSITION: # proceed with venofer today # follow up in 3 month [Tuesday PM]- MD; cbc/bmp; iron studies/ferritin--posisble venofer- Dr.B  Cc; Dr.Miller.

## 2020-01-30 ENCOUNTER — Other Ambulatory Visit: Payer: Self-pay

## 2020-01-31 ENCOUNTER — Inpatient Hospital Stay (HOSPITAL_BASED_OUTPATIENT_CLINIC_OR_DEPARTMENT_OTHER): Payer: Medicare Other | Admitting: Internal Medicine

## 2020-01-31 ENCOUNTER — Inpatient Hospital Stay: Payer: Medicare Other

## 2020-01-31 ENCOUNTER — Inpatient Hospital Stay: Payer: Medicare Other | Attending: Internal Medicine

## 2020-01-31 ENCOUNTER — Encounter: Payer: Self-pay | Admitting: Internal Medicine

## 2020-01-31 DIAGNOSIS — D500 Iron deficiency anemia secondary to blood loss (chronic): Secondary | ICD-10-CM | POA: Diagnosis present

## 2020-01-31 DIAGNOSIS — Q2730 Arteriovenous malformation, site unspecified: Secondary | ICD-10-CM | POA: Insufficient documentation

## 2020-01-31 LAB — BASIC METABOLIC PANEL
Anion gap: 8 (ref 5–15)
BUN: 21 mg/dL (ref 8–23)
CO2: 32 mmol/L (ref 22–32)
Calcium: 9.3 mg/dL (ref 8.9–10.3)
Chloride: 101 mmol/L (ref 98–111)
Creatinine, Ser: 1.21 mg/dL — ABNORMAL HIGH (ref 0.44–1.00)
GFR calc Af Amer: 47 mL/min — ABNORMAL LOW (ref >60)
GFR calc non Af Amer: 40 mL/min — ABNORMAL LOW (ref >60)
Glucose, Bld: 54 mg/dL — ABNORMAL LOW (ref 70–99)
Potassium: 4.8 mmol/L (ref 3.5–5.1)
Sodium: 141 mmol/L (ref 135–145)

## 2020-01-31 LAB — CBC WITH DIFFERENTIAL/PLATELET
Abs Immature Granulocytes: 0.05 10*3/uL (ref 0.00–0.07)
Basophils Absolute: 0 10*3/uL (ref 0.0–0.1)
Basophils Relative: 0 %
Eosinophils Absolute: 0 10*3/uL (ref 0.0–0.5)
Eosinophils Relative: 0 %
HCT: 32.4 % — ABNORMAL LOW (ref 36.0–46.0)
Hemoglobin: 10.6 g/dL — ABNORMAL LOW (ref 12.0–15.0)
Immature Granulocytes: 1 %
Lymphocytes Relative: 19 %
Lymphs Abs: 2 10*3/uL (ref 0.7–4.0)
MCH: 30.1 pg (ref 26.0–34.0)
MCHC: 32.7 g/dL (ref 30.0–36.0)
MCV: 92 fL (ref 80.0–100.0)
Monocytes Absolute: 4.9 10*3/uL — ABNORMAL HIGH (ref 0.1–1.0)
Monocytes Relative: 48 %
Neutro Abs: 3.4 10*3/uL (ref 1.7–7.7)
Neutrophils Relative %: 32 %
Platelets: 230 10*3/uL (ref 150–400)
RBC: 3.52 MIL/uL — ABNORMAL LOW (ref 3.87–5.11)
RDW: 15.3 % (ref 11.5–15.5)
Smear Review: NORMAL
WBC: 10.4 10*3/uL (ref 4.0–10.5)
nRBC: 0 % (ref 0.0–0.2)

## 2020-01-31 LAB — IRON AND TIBC
Iron: 57 ug/dL (ref 28–170)
Saturation Ratios: 18 % (ref 10.4–31.8)
TIBC: 316 ug/dL (ref 250–450)
UIBC: 259 ug/dL

## 2020-01-31 LAB — FERRITIN: Ferritin: 123 ng/mL (ref 11–307)

## 2020-01-31 MED ORDER — IRON SUCROSE 20 MG/ML IV SOLN
200.0000 mg | Freq: Once | INTRAVENOUS | Status: AC
  Administered 2020-01-31: 200 mg via INTRAVENOUS
  Filled 2020-01-31: qty 10

## 2020-01-31 MED ORDER — SODIUM CHLORIDE 0.9 % IV SOLN
Freq: Once | INTRAVENOUS | Status: AC
  Filled 2020-01-31: qty 250

## 2020-01-31 NOTE — Progress Notes (Signed)
[place] OFFICE PROGRESS NOTE  Patient Care Team: Rusty Aus, MD as PCP - General (Internal Medicine)   SUMMARY OF ONCOLOGIC HISTORY:  # 2005 [atleast] ANEMIA- Iron def; ;?  Other reasons; W/Platelet- N; mild monocytosis [~2.5]; NO BMbx;Hx of Procrit in past;  IV Venofer q 3 M;   # Unprovoked DVT [2005] currently not on anticoagulation; COPD on 0 2 [place]  INTERVAL HISTORY:   86 year-old female patient with above history chronic anemia likely attributed to iron deficiency-question malabsorption/AV malformation/ ? MDS is here for follow-up.  Patient continues to complain of fatigue.  However her energy level slightly improved after IV Venofer.  Chronic shortness of breath/chronic respiratory failure on 3 L of oxygen.  No blood in stools or black or stools.  No nausea no vomiting.  Review of Systems  Constitutional: Positive for malaise/fatigue and weight loss. Negative for chills, diaphoresis and fever.  HENT: Negative for nosebleeds and sore throat.   Eyes: Negative for double vision.  Respiratory: Positive for cough, sputum production and shortness of breath. Negative for hemoptysis and wheezing.   Cardiovascular: Negative for chest pain, palpitations, orthopnea and leg swelling.  Gastrointestinal: Negative for abdominal pain, blood in stool, constipation, diarrhea, heartburn, melena, nausea and vomiting.  Genitourinary: Negative for dysuria, frequency and urgency.  Musculoskeletal: Negative for back pain and joint pain.  Skin: Negative.  Negative for itching and rash.  Neurological: Negative for dizziness, tingling, focal weakness, weakness and headaches.  Endo/Heme/Allergies: Does not bruise/bleed easily.  Psychiatric/Behavioral: Negative for depression. The patient is not nervous/anxious and does not have insomnia.      PAST MEDICAL HISTORY :  Past Medical History:  Diagnosis Date  . Asthma without status asthmaticus   . COPD, severe ([place])   . Disc  disease, degenerative, lumbar or lumbosacral   . DVT (deep venous thrombosis) ([place]) 2005  . Fibrocystic breast disease   . Gastric antral vascular ectasia   . History of uterine cancer   . HTN (hypertension)   . Hyperlipidemia   . Osteoporosis     PAST SURGICAL HISTORY :   Past Surgical History:  Procedure Laterality Date  . APPENDECTOMY    . CHOLECYSTECTOMY      FAMILY HISTORY :   Family History  Problem Relation Age of Onset  . Lung cancer Father     SOCIAL HISTORY:   Social History   Tobacco Use  . Smoking status: Former Smoker    Years: 25.00    Types: Cigarettes    Quit date: 12/05/1988    Years since quitting: 31.1  . Smokeless tobacco: Never Used  Substance Use Topics  . Alcohol use: Not on file  . Drug use: Not on file    ALLERGIES:  is allergic to codeine, penicillins, and sulfa antibiotics.  MEDICATIONS:  Current Outpatient Medications  Medication Sig Dispense Refill  . albuterol (PROAIR HFA) 108 (90 BASE) MCG/ACT inhaler Inhale 1-2 puffs into the lungs every 6 (six) hours as needed for wheezing or shortness of breath.     . ALPRAZolam (XANAX) 0.5 MG tablet Take 0.5 mg by mouth 3 (three) times daily as needed for anxiety.     . Black Pepper-Turmeric (TURMERIC COMPLEX/BLACK PEPPER) 3-500 MG CAPS Take 500 mg by mouth daily.    . Cholecalciferol 50 MCG (2000 UT) TABS Take 2,000 Units by mouth daily.     . clobetasol cream (TEMOVATE) 6.16 % Apply 1 application topically See admin instructions. Apply to itchy areas  on legs as directed.    . docusate sodium (COLACE) 50 MG capsule Take 50 mg by mouth daily.    Marland Kitchen esomeprazole (NEXIUM) 20 MG capsule Take 20 mg by mouth daily at 12 noon.     . ferrous sulfate 325 (65 FE) MG tablet Take 325 mg by mouth daily.    . hydrocortisone (PROCTOSOL HC) 2.5 % rectal cream Place 1 application rectally See admin instructions. Apply to the affected areas three times a day as directed.    Marland Kitchen ipratropium-albuterol (DUONEB) 0.5-2.5  (3) MG/3ML SOLN PLACE 1 VIAL IN NEBULIZER AND INHALE 3 TIMES A DAY    . loratadine (CLARITIN) 10 MG tablet Take 10 mg by mouth daily.    Marland Kitchen lovastatin (MEVACOR) 40 MG tablet Take 40 mg by mouth at bedtime.     . mometasone-formoterol (DULERA) 100-5 MCG/ACT AERO Inhale 2 puffs into the lungs 2 (two) times daily. 13 g 0  . Multiple Vitamins-Minerals (BITOIN PLUS/CALCIUM/VIT D3) TABS Take 1 tablet by mouth daily.    . NON FORMULARY Vein/vascular vitamin-1 tablet once daily    . sertraline (ZOLOFT) 50 MG tablet Take 50 mg by mouth daily.     Marland Kitchen tiotropium (SPIRIVA HANDIHALER) 18 MCG inhalation capsule Take 1 Inhaler by mouth as needed for shortness of breath.    . traMADol (ULTRAM) 50 MG tablet Take 50 mg by mouth.     . EPINEPHrine 0.3 mg/0.3 mL IJ SOAJ injection  (Patient not taking: Reported on 01/30/2020)    . HYDROcodone-acetaminophen (NORCO/VICODIN) 5-325 MG tablet Take 1 tablet by mouth every 6 (six) hours as needed for moderate pain.  (Patient not taking: Reported on 01/30/2020)     No current facility-administered medications for this visit.    PHYSICAL EXAMINATION: ECOG PERFORMANCE STATUS: 1 - Symptomatic but completely ambulatory  BP (!) 131/58 (BP Location: Left Arm, Patient Position: Sitting, Cuff Size: Normal)   Pulse (!) 101   Temp 97.8 F (36.6 C) (Tympanic)   Resp 18   Ht 5\' 1"  (1.549 m)   Wt 96 lb 12.8 oz (43.9 kg)   SpO2 94%   BMI 18.29 kg/m   Filed Weights   01/31/20 1314  Weight: 96 lb 12.8 oz (43.9 kg)    Physical Exam Constitutional:      Comments: Thin built frail appearing cachectic female patient.  She is in a wheelchair.  Patient alone.  She is on home O2.  HENT:     Head: Normocephalic and atraumatic.     Mouth/Throat:     Pharynx: No oropharyngeal exudate.  Eyes:     Pupils: Pupils are equal, round, and reactive to light.  Cardiovascular:     Rate and Rhythm: Normal rate and regular rhythm.  Pulmonary:     Effort: No respiratory distress.     Breath  sounds: No wheezing.  Abdominal:     General: Bowel sounds are normal. There is no distension.     Palpations: Abdomen is soft. There is no mass.     Tenderness: There is no abdominal tenderness. There is no guarding or rebound.  Musculoskeletal:        General: No tenderness. Normal range of motion.     Cervical back: Normal range of motion and neck supple.  Skin:    General: Skin is warm.  Neurological:     Mental Status: She is alert and oriented to person, place, and time.  Psychiatric:        Mood and Affect:  Affect normal.      LABORATORY DATA:  I have reviewed the data as listed    Component Value Date/Time   NA 141 01/31/2020 1300   NA 139 12/19/2012 0850   K 4.8 01/31/2020 1300   K 4.0 12/19/2012 0850   CL 101 01/31/2020 1300   CL 103 12/19/2012 0850   CO2 32 01/31/2020 1300   CO2 29 12/19/2012 0850   GLUCOSE 54 (L) 01/31/2020 1300   GLUCOSE 116 (H) 12/19/2012 0850   BUN 21 01/31/2020 1300   BUN 16 12/19/2012 0850   CREATININE 1.21 (H) 01/31/2020 1300   CREATININE 1.16 12/19/2012 0850   CALCIUM 9.3 01/31/2020 1300   CALCIUM 8.8 12/19/2012 0850   PROT 8.0 07/11/2019 1840   ALBUMIN 3.9 07/11/2019 1840   AST 22 07/11/2019 1840   ALT 15 07/11/2019 1840   ALKPHOS 81 07/11/2019 1840   BILITOT 0.7 07/11/2019 1840   GFRNONAA 40 (L) 01/31/2020 1300   GFRNONAA 45 (L) 12/19/2012 0850   GFRAA 47 (L) 01/31/2020 1300   GFRAA 52 (L) 12/19/2012 0850    No results found for: SPEP, UPEP  Lab Results  Component Value Date   WBC 10.4 01/31/2020   NEUTROABS 3.4 01/31/2020   HGB 10.6 (L) 01/31/2020   HCT 32.4 (L) 01/31/2020   MCV 92.0 01/31/2020   PLT 230 01/31/2020      Chemistry      Component Value Date/Time   NA 141 01/31/2020 1300   NA 139 12/19/2012 0850   K 4.8 01/31/2020 1300   K 4.0 12/19/2012 0850   CL 101 01/31/2020 1300   CL 103 12/19/2012 0850   CO2 32 01/31/2020 1300   CO2 29 12/19/2012 0850   BUN 21 01/31/2020 1300   BUN 16 12/19/2012 0850    CREATININE 1.21 (H) 01/31/2020 1300   CREATININE 1.16 12/19/2012 0850      Component Value Date/Time   CALCIUM 9.3 01/31/2020 1300   CALCIUM 8.8 12/19/2012 0850   ALKPHOS 81 07/11/2019 1840   AST 22 07/11/2019 1840   ALT 15 07/11/2019 1840   BILITOT 0.7 07/11/2019 1840       ASSESSMENT & PLAN:  Iron deficiency anemia due to chronic blood loss # Chronic anemia- at least from 2005 as per patient. Iron deficiency Anemia to be due to AV malformation vs ? Low grade MDS [no bone marrow Bx].  Hemoglobin is 10.7 . Continue PO iron.  Proceed with IV Venofer today.   # COPD- on home O2; 3 L stable.  # DISPOSITION: # proceed with venofer today # follow up in 3 month [Tuesday PM]- MD; cbc/bmp; iron studies/ferritin--posisble venofer- Dr.B  Cc; Dr.Miller.    Cammie Sickle, MD 01/31/2020 2:23 PM

## 2020-01-31 NOTE — Assessment & Plan Note (Signed)
#   Chronic anemia- at least from 2005 as per patient. Iron deficiency Anemia to be due to AV malformation vs ? Low grade MDS [no bone marrow Bx].  Hemoglobin is 10.7 . Continue PO iron.  Proceed with IV Venofer today.   # COPD- on home O2; 3 L stable.  # DISPOSITION: # proceed with venofer today # follow up in 3 month [Tuesday PM]- MD; cbc/bmp; iron studies/ferritin--posisble venofer- Dr.B  Cc; Dr.Miller.

## 2020-05-01 ENCOUNTER — Inpatient Hospital Stay: Payer: Medicare Other

## 2020-05-01 ENCOUNTER — Inpatient Hospital Stay: Payer: Medicare Other | Admitting: Internal Medicine

## 2020-07-03 ENCOUNTER — Ambulatory Visit: Payer: Medicare Other

## 2020-07-03 ENCOUNTER — Ambulatory Visit: Payer: Medicare Other | Admitting: Internal Medicine

## 2020-07-03 ENCOUNTER — Other Ambulatory Visit: Payer: Medicare Other

## 2020-07-11 IMAGING — CT CT ABD-PELV W/ CM
2 of 5 series · 15 of 46 positions shown, 17 images · IV contrast (APPLIED)
Comparison: Chest radiograph dated 07/11/2019 and CT dated
08/17/2015.

CLINICAL DATA: 88-year-old female with acute on chronic hypoxic
respiratory failure. Shortness of breath.

EXAM:
CT ANGIOGRAPHY CHEST, ABDOMEN AND PELVIS
TECHNIQUE: Multidetector CT imaging through the chest, abdomen and pelvis was
performed using the standard protocol during bolus administration of
intravenous contrast. Multiplanar reconstructed images and MIPs were
obtained and reviewed to evaluate the vascular anatomy.
CONTRAST:  75mL OMNIPAQUE IOHEXOL 350 MG/ML SOLN

[Series 5: axial st · axial · 0.71mm/px · z∈[-346,+14]mm · 12 of 82 slices shown, 14 images]
[im 5/82  soft-tissue]
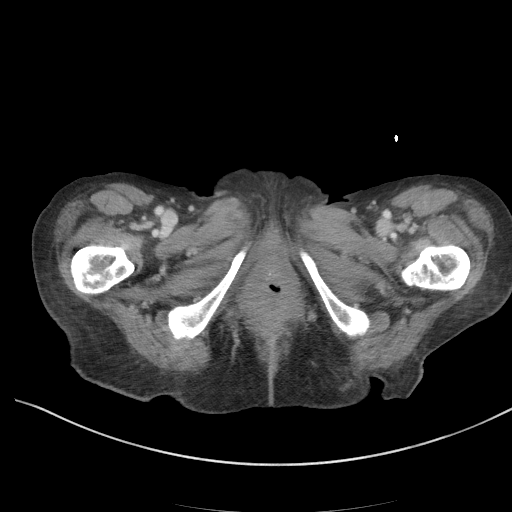
[im 5/82  bone]
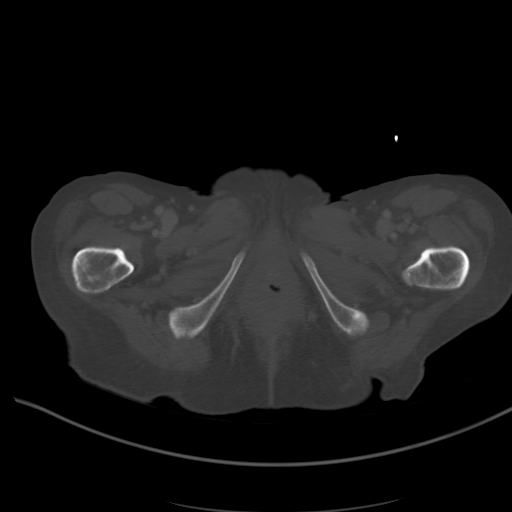
[im 15/82  soft-tissue]
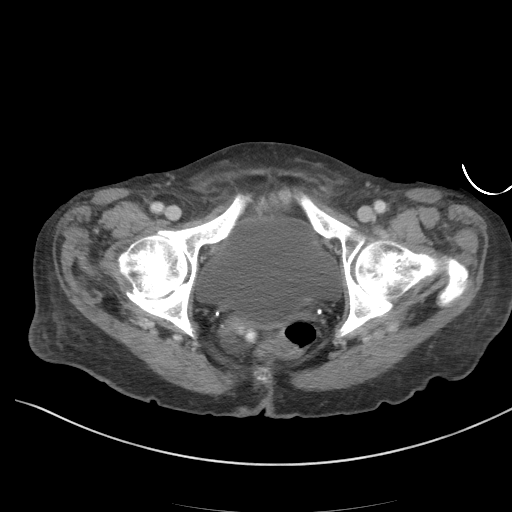
[im 20/82  soft-tissue]
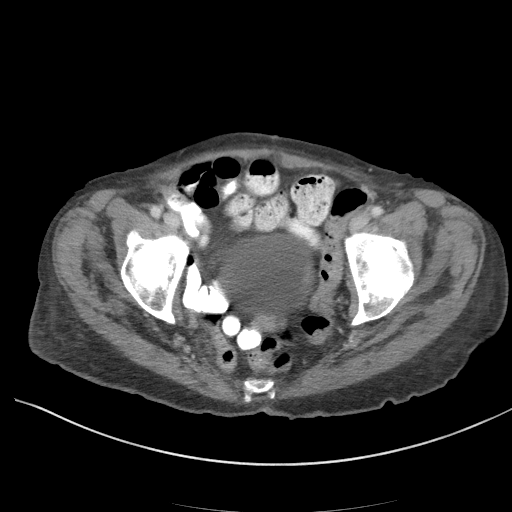
[im 24/82  soft-tissue]
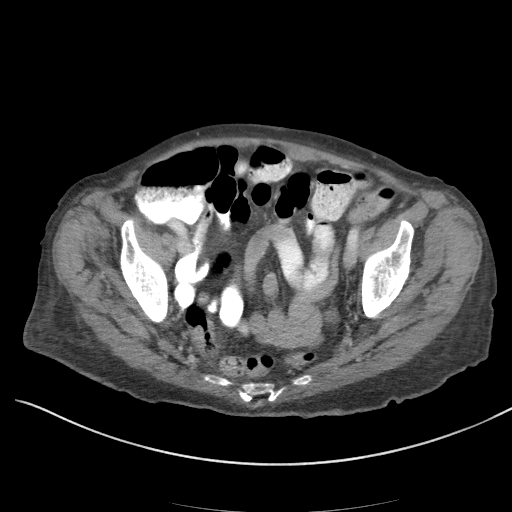
[im 34/82  soft-tissue]
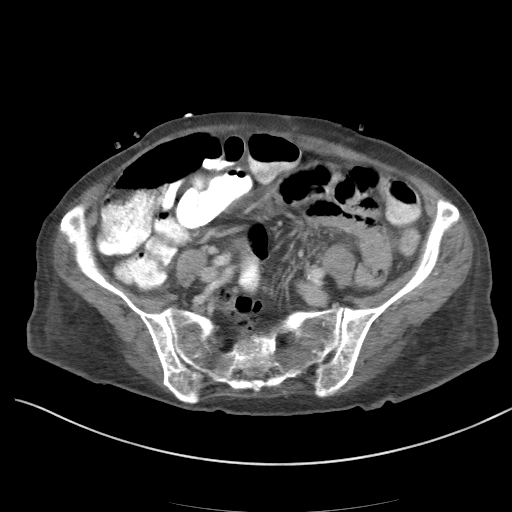
[im 39/82  soft-tissue]
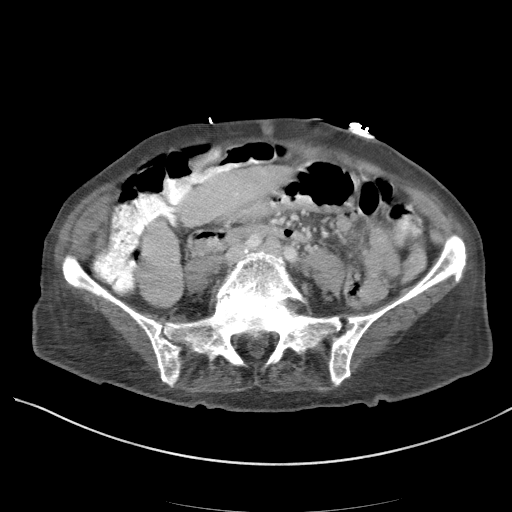
[im 43/82  soft-tissue]
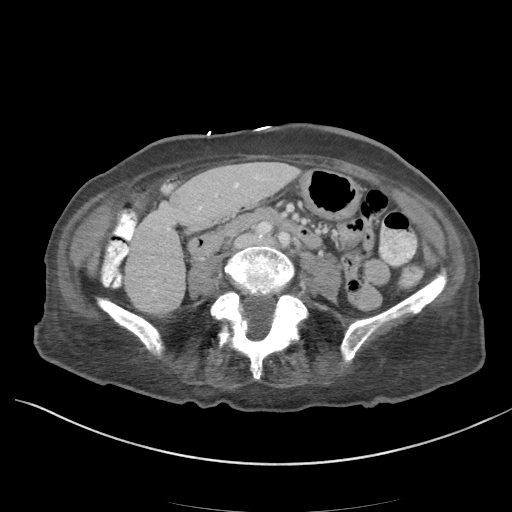
[im 53/82  soft-tissue]
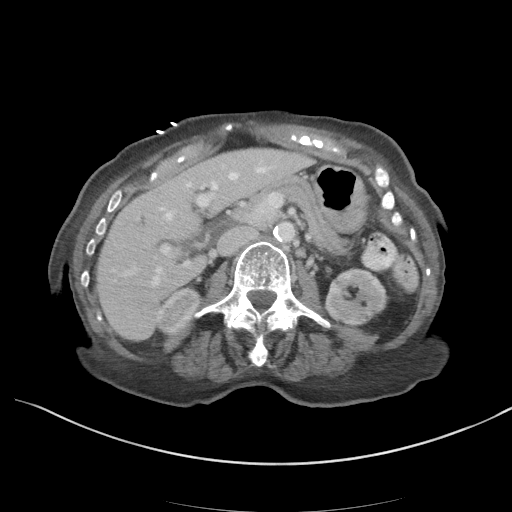
[im 58/82  soft-tissue]
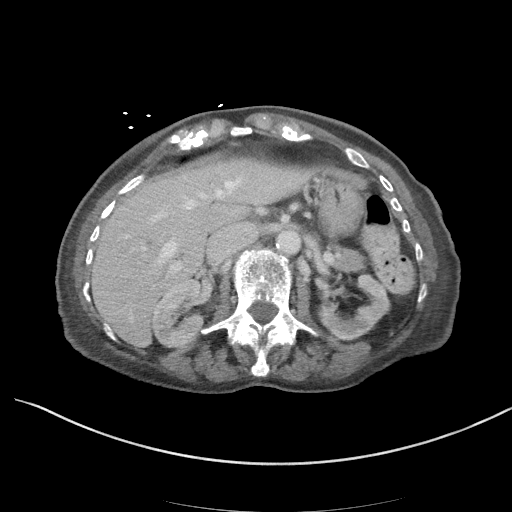
[im 58/82  bone]
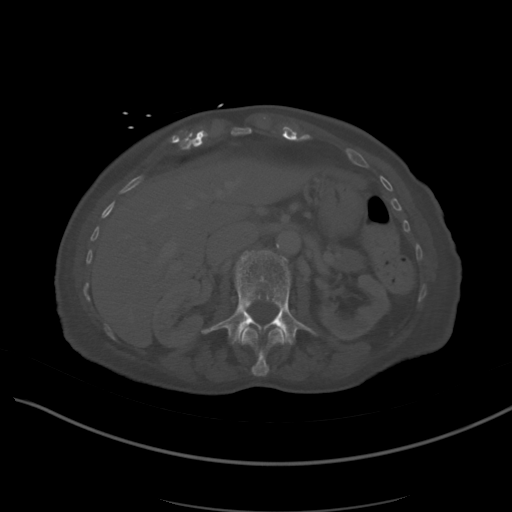
[im 62/82  soft-tissue]
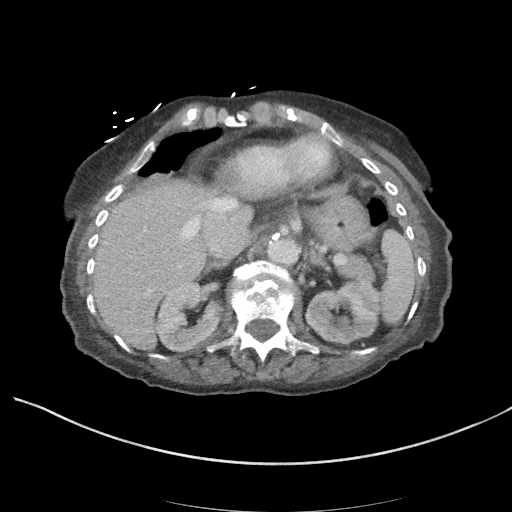
[im 72/82  soft-tissue]
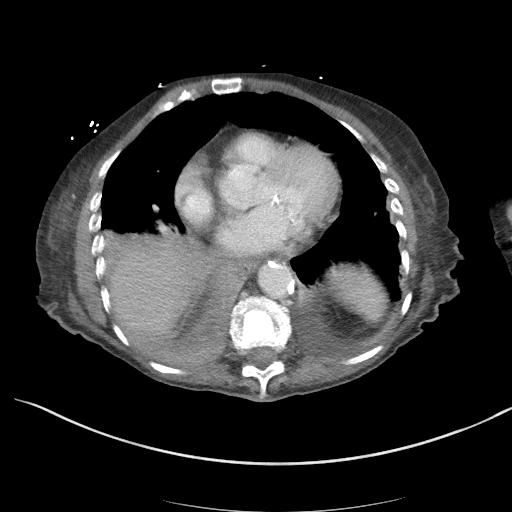
[im 77/82  soft-tissue]
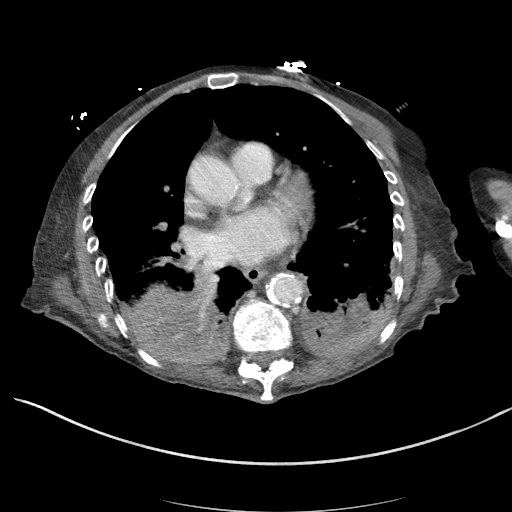

[Series 8: coronal st · coronal · 0.78mm/px · 3 of 93 slices shown]
[im 31/93  soft-tissue]
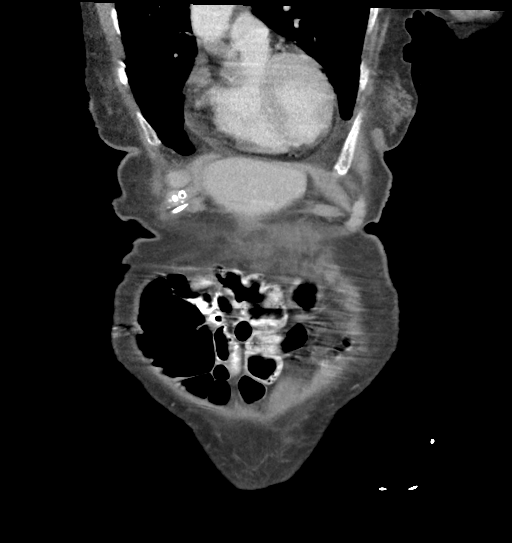
[im 41/93  soft-tissue]
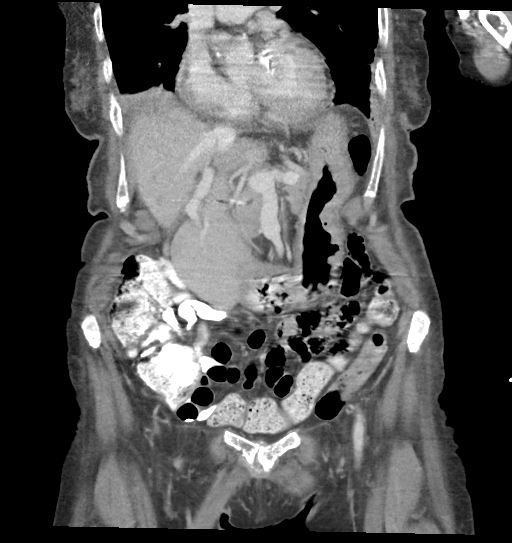
[im 52/93  soft-tissue]
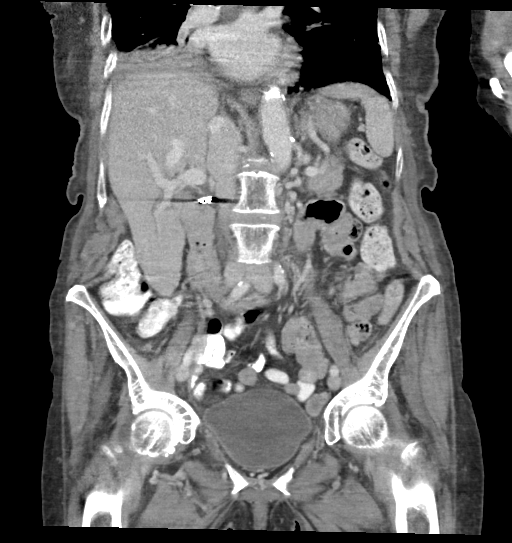

[15 of 46 positions shown; findings below may reference images not displayed]

FINDINGS: Evaluation of this exam is limited due to respiratory motion
artifact. Evaluation is also limited due to streak artifact caused
by patient's arms.

CTA CHEST FINDINGS

Cardiovascular: There is no cardiomegaly or pericardial effusion.
Advanced atherosclerotic calcification of the thoracic aorta. No
aneurysmal dilatation or dissection. The origins of the great
vessels of the aortic arch appear patent as visualized. Evaluation
of the pulmonary arteries is limited due to respiratory motion
artifact and suboptimal opacification and visualization of the
peripheral branches. No large or central pulmonary artery embolus
identified.

Mediastinum/Nodes: No hilar or mediastinal adenopathy. The esophagus
is grossly unremarkable. No mediastinal fluid collection.

Lungs/Pleura: There is background of moderate to severe emphysema.
Large areas of bilateral lower lobe consolidative changes most
consistent with pneumonia, possibly aspiration. Clinical correlation
is recommended. Small bilateral pleural effusions noted. Mucous
content noted in the right bronchus intermedius and right lower lobe
bronchus. No pneumothorax.

Musculoskeletal: Osteopenia with degenerative changes of the spine.
No acute osseous pathology.

Review of the MIP images confirms the above findings.

CTA ABDOMEN AND PELVIS FINDINGS

VASCULAR

Aorta: Advanced aortoiliac atherosclerotic disease. No aneurysmal
dilatation or dissection.

Celiac: Patent without evidence of aneurysm, dissection, vasculitis
or significant stenosis.

SMA: Patent without evidence of aneurysm, dissection, vasculitis or
significant stenosis.

Renals: Atherosclerotic calcification of the origins of the renal
arteries. The renal arteries remain patent.

IMA: Patent without evidence of aneurysm, dissection, vasculitis or
significant stenosis.

Inflow: Atherosclerotic calcification of the iliac arteries. The
iliac arteries are patent.

Veins: The IVC is unremarkable. The SMV, splenic vein, and main
portal vein are patent. No portal venous gas.

Review of the MIP images confirms the above findings.

NON-VASCULAR

No intra-abdominal free air or free fluid

Hepatobiliary: The liver is unremarkable. There is mild intrahepatic
biliary ductal dilatation, likely post cholecystectomy.

Pancreas: Unremarkable. No pancreatic ductal dilatation or
surrounding inflammatory changes.

Spleen: Normal in size without focal abnormality.

Adrenals/Urinary Tract: The adrenal glands are unremarkable. Mild
bilateral renal parenchyma atrophy. There is no hydronephrosis on
either side. There is symmetric enhancement and excretion of
contrast by both kidneys. The visualized ureters and urinary bladder
appear unremarkable.

Stomach/Bowel: There is no bowel obstruction or active inflammation.
Small scattered sigmoid diverticula without active inflammatory
changes noted. Appendectomy.

Lymphatic: No adenopathy.

Reproductive: Hysterectomy. No adnexal masses.

Other: Diffuse subcutaneous edema. No fluid collection.

Musculoskeletal: Osteopenia with degenerative changes of the spine.
No acute osseous pathology. Bilateral L5 pars defects with grade 2
L5-S1 anterolisthesis.

Review of the MIP images confirms the above findings.
IMPRESSION: 1. Bilateral lower lobe consolidative changes consistent with
pneumonia, and possibly aspiration.
2. Mucous content noted in the bronchus intermedius and right lower
lobe bronchus.
3. No CT evidence of central pulmonary artery embolus.
4. No acute intra-abdominopelvic pathology.
5. Aortic Atherosclerosis (H4UZ4-DRE.E) and Emphysema ([id number].T).

## 2020-08-09 ENCOUNTER — Other Ambulatory Visit: Payer: Self-pay

## 2020-08-09 ENCOUNTER — Ambulatory Visit: Payer: Medicare Other | Admitting: Dermatology

## 2020-08-09 DIAGNOSIS — L409 Psoriasis, unspecified: Secondary | ICD-10-CM | POA: Diagnosis not present

## 2020-08-09 MED ORDER — TRIAMCINOLONE ACETONIDE 0.1 % EX OINT: TOPICAL_OINTMENT | CUTANEOUS | 1 refills | Status: AC

## 2020-08-09 MED ORDER — OTEZLA 30 MG PO TABS: 30.0000 mg | ORAL_TABLET | Freq: Two times a day (BID) | ORAL | 4 refills | Status: AC

## 2020-08-09 NOTE — Progress Notes (Signed)
   New Patient Visit  Subjective  Margaret [name] is a 87 y.o. female who presents for the following: Psoriasis (On the trunk and extremities x 6 years - patient has been using Clobetasol 0.05% cream BID, but is concerned she may have used to much and burned her skin. She is currently on Keflex 500mg  po BID and has two days left for associated cellulitis).  The following portions of the chart were reviewed this encounter and updated as appropriate:   Tobacco  Allergies  Meds  Problems  Med Hx  Surg Hx  Fam Hx     Review of Systems:  No other skin or systemic complaints except as noted in HPI or Assessment and Plan.  Objective  Well appearing patient in no apparent distress; mood and affect are within normal limits.  A focused examination was performed including the trunk and extremities. Relevant physical exam findings are noted in the Assessment and Plan.  Objective  Abdomen, back, chest: Well-demarcated erythematous papules/plaques with silvery scale, guttate pink scaly papules.   Images          Assessment & Plan  Psoriasis - severe and persistent despite topical treatment Abdomen, back, chest BSA 20% -  Psoriasis is a chronic non-curable, but treatable genetic/hereditary disease that may have other systemic features affecting other organ systems such as joints (Psoriatic Arthritis). It is associated with an increased risk of inflammatory bowel disease, heart disease, non-alcoholic fatty liver disease, and depression.    Discussed Otezla. Side effects of Otezla (apremilast) include diarrhea, nausea, headache, upper respiratory infection, depression, and weight decrease (5-10%). It should only be taken by pregnant women after a discussion regarding risks and benefits with their doctor. Goal is control of skin condition, not cure.  The use of Rutherford Nail requires long term medication management, including periodic office visits.  Starter pack of Otezla given. Titrate up to 30mg   po QD.   Start TMC 0.1% ointment BID 5d/week. Topical steroids (such as triamcinolone, fluocinolone, fluocinonide, mometasone, clobetasol, halobetasol, betamethasone, hydrocortisone) can cause thinning and lightening of the skin if they are used for too long in the same area. Your physician has selected the right strength medicine for your problem and area affected on the body. Please use your medication only as directed by your physician to prevent side effects.   OK to use Vaseline on the weekends.  triamcinolone ointment (KENALOG) 0.1 % - Abdomen, back, chest  Apremilast (OTEZLA) 30 MG TABS - Abdomen, back, chest  Return in about 1 month (around 09/09/2020) for psoriasis follow up .  Luther Redo, CMA, am acting as scribe for Sarina Ser, MD .  Documentation: I have reviewed the above documentation for accuracy and completeness, and I agree with the above.  Sarina Ser, MD

## 2020-08-13 ENCOUNTER — Other Ambulatory Visit: Payer: Self-pay

## 2020-08-13 ENCOUNTER — Inpatient Hospital Stay: Payer: Medicare Other

## 2020-08-13 ENCOUNTER — Inpatient Hospital Stay: Payer: Medicare Other | Attending: Internal Medicine | Admitting: Internal Medicine

## 2020-08-13 VITALS — BP 137/73 | HR 92 | Temp 96.9°F | Resp 18

## 2020-08-13 DIAGNOSIS — D500 Iron deficiency anemia secondary to blood loss (chronic): Secondary | ICD-10-CM

## 2020-08-13 LAB — CBC WITH DIFFERENTIAL/PLATELET
Abs Immature Granulocytes: 0 10*3/uL (ref 0.00–0.07)
Basophils Absolute: 0.2 10*3/uL — ABNORMAL HIGH (ref 0.0–0.1)
Basophils Relative: 1 %
Eosinophils Absolute: 0 10*3/uL (ref 0.0–0.5)
Eosinophils Relative: 0 %
HCT: 31.9 % — ABNORMAL LOW (ref 36.0–46.0)
Hemoglobin: 9.9 g/dL — ABNORMAL LOW (ref 12.0–15.0)
Lymphocytes Relative: 32 %
Lymphs Abs: 5.1 10*3/uL — ABNORMAL HIGH (ref 0.7–4.0)
MCH: 29.2 pg (ref 26.0–34.0)
MCHC: 31 g/dL (ref 30.0–36.0)
MCV: 94.1 fL (ref 80.0–100.0)
Monocytes Absolute: 5.8 10*3/uL — ABNORMAL HIGH (ref 0.1–1.0)
Monocytes Relative: 37 %
Neutro Abs: 4.7 10*3/uL (ref 1.7–7.7)
Neutrophils Relative %: 30 %
Platelets: 254 10*3/uL (ref 150–400)
RBC: 3.39 MIL/uL — ABNORMAL LOW (ref 3.87–5.11)
RDW: 14.7 % (ref 11.5–15.5)
Smear Review: NORMAL
WBC: 15.8 10*3/uL — ABNORMAL HIGH (ref 4.0–10.5)
nRBC: 0 % (ref 0.0–0.2)

## 2020-08-13 LAB — BASIC METABOLIC PANEL
Anion gap: 11 (ref 5–15)
BUN: 21 mg/dL (ref 8–23)
CO2: 28 mmol/L (ref 22–32)
Calcium: 8.8 mg/dL — ABNORMAL LOW (ref 8.9–10.3)
Chloride: 97 mmol/L — ABNORMAL LOW (ref 98–111)
Creatinine, Ser: 1.08 mg/dL — ABNORMAL HIGH (ref 0.44–1.00)
GFR, Estimated: 50 mL/min — ABNORMAL LOW (ref >60)
Glucose, Bld: 101 mg/dL — ABNORMAL HIGH (ref 70–99)
Potassium: 3.9 mmol/L (ref 3.5–5.1)
Sodium: 136 mmol/L (ref 135–145)

## 2020-08-13 LAB — IRON AND TIBC
Iron: 72 ug/dL (ref 28–170)
Saturation Ratios: 21 % (ref 10.4–31.8)
TIBC: 349 ug/dL (ref 250–450)
UIBC: 277 ug/dL

## 2020-08-13 LAB — FERRITIN: Ferritin: 64 ng/mL (ref 11–307)

## 2020-08-13 MED ORDER — IRON SUCROSE 20 MG/ML IV SOLN
200.0000 mg | Freq: Once | INTRAVENOUS | Status: AC
  Administered 2020-08-13: 200 mg via INTRAVENOUS
  Filled 2020-08-13: qty 10

## 2020-08-13 MED ORDER — SODIUM CHLORIDE 0.9 % IV SOLN
Freq: Once | INTRAVENOUS | Status: AC
  Filled 2020-08-13: qty 250

## 2020-08-13 NOTE — Assessment & Plan Note (Addendum)
#   Chronic anemia- at least from 2005 as per patient. Iron deficiency Anemia to be due to AV malformation vs ? Low grade MDS [no bone marrow Bx].  Hemoglobin is 9.9; . Continue PO iron.  Proceed with IV Venofer today.   # COPD- on home O2; 3 L -STABLE.   # Psoriasis- on steroids-defer to PCP/dermatology.  # DISPOSITION: ADD iron studies/ferritin-  # proceed with venofer today # follow up in 4 month - MD; cbc/bmp-posisble venofer- Dr.B  Cc; Dr.Miller.

## 2020-08-13 NOTE — Progress Notes (Signed)
[facility] OFFICE PROGRESS NOTE  Patient Care Team: Rusty Aus, MD as PCP - General (Internal Medicine)   SUMMARY OF ONCOLOGIC HISTORY:  # 2005 [atleast] ANEMIA- Iron def; ;?  Other reasons; W/Platelet- N; mild monocytosis [~2.5]; NO BMbx;Hx of Procrit in past;  IV Venofer q 3 M;   # Unprovoked DVT [2005] currently not on anticoagulation; COPD on 0 2 [place]  INTERVAL HISTORY:   87 year-old female patient with above history chronic anemia likely attributed to iron deficiency-question malabsorption/AV malformation/ ? MDS is here for follow-up.  In the interim patient was diagnosed with Covid pneumonia.  She also been diagnosed with psoriasis.  She is currently on steroids.  Awaiting follow-up with dermatology.  Complains of fatigue.  Shortness of breath on exertion-she continues to be on 3 L of oxygen.  Denies any blood in stools or black-colored stools.  Review of Systems  Constitutional: Positive for malaise/fatigue and weight loss. Negative for chills, diaphoresis and fever.  HENT: Negative for nosebleeds and sore throat.   Eyes: Negative for double vision.  Respiratory: Positive for cough, sputum production and shortness of breath. Negative for hemoptysis and wheezing.   Cardiovascular: Negative for chest pain, palpitations, orthopnea and leg swelling.  Gastrointestinal: Negative for abdominal pain, blood in stool, constipation, diarrhea, heartburn, melena, nausea and vomiting.  Genitourinary: Negative for dysuria, frequency and urgency.  Musculoskeletal: Negative for back pain and joint pain.  Skin: Positive for itching and rash.  Neurological: Negative for dizziness, tingling, focal weakness, weakness and headaches.  Endo/Heme/Allergies: Does not bruise/bleed easily.  Psychiatric/Behavioral: Negative for depression. The patient is not nervous/anxious and does not have insomnia.      PAST MEDICAL HISTORY :  Past Medical History:  Diagnosis Date  . Asthma  without status asthmaticus   . COPD, severe ([place])   . Disc disease, degenerative, lumbar or lumbosacral   . DVT (deep venous thrombosis) ([place]) 2005  . Fibrocystic breast disease   . Gastric antral vascular ectasia   . History of uterine cancer   . HTN (hypertension)   . Hyperlipidemia   . Osteoporosis     PAST SURGICAL HISTORY :   Past Surgical History:  Procedure Laterality Date  . APPENDECTOMY    . CHOLECYSTECTOMY      FAMILY HISTORY :   Family History  Problem Relation Age of Onset  . Lung cancer Father     SOCIAL HISTORY:   Social History   Tobacco Use  . Smoking status: Former Smoker    Years: 25.00    Types: Cigarettes    Quit date: 12/05/1988    Years since quitting: 31.7  . Smokeless tobacco: Never Used    ALLERGIES:  is allergic to codeine, penicillins, and sulfa antibiotics.  MEDICATIONS:  Current Outpatient Medications  Medication Sig Dispense Refill  . ALPRAZolam (XANAX) 0.5 MG tablet Take 0.5 mg by mouth 3 (three) times daily as needed for anxiety.     Marland Kitchen Apremilast (OTEZLA) 30 MG TABS Take 1 tablet (30 mg total) by mouth 2 (two) times daily. 60 tablet 4  . Black Pepper-Turmeric 3-500 MG CAPS Take 500 mg by mouth daily.    . Cholecalciferol 50 MCG (2000 UT) TABS Take 2,000 Units by mouth daily.     Marland Kitchen docusate sodium (COLACE) 50 MG capsule Take 50 mg by mouth daily.    Marland Kitchen EPINEPHrine 0.3 mg/0.3 mL IJ SOAJ injection     . esomeprazole (NEXIUM) 20 MG capsule Take 20 mg  by mouth daily at 12 noon.     . ferrous sulfate 325 (65 FE) MG tablet Take 325 mg by mouth daily.    Marland Kitchen ipratropium-albuterol (DUONEB) 0.5-2.5 (3) MG/3ML SOLN PLACE 1 VIAL IN NEBULIZER AND INHALE 3 TIMES A DAY    . lovastatin (MEVACOR) 40 MG tablet Take 40 mg by mouth at bedtime.     . mometasone-formoterol (DULERA) 100-5 MCG/ACT AERO Inhale 2 puffs into the lungs 2 (two) times daily. 13 g 0  . Multiple Vitamins-Minerals (BITOIN PLUS/CALCIUM/VIT D3) TABS Take 1 tablet by mouth daily.    .  NON FORMULARY Vein/vascular vitamin-1 tablet once daily    . predniSONE (DELTASONE) 10 MG tablet Take 30 tablets by mouth daily.    . sertraline (ZOLOFT) 50 MG tablet Take 50 mg by mouth daily.     Marland Kitchen tiotropium (SPIRIVA) 18 MCG inhalation capsule Take 1 Inhaler by mouth as needed for shortness of breath.    . traMADol (ULTRAM) 50 MG tablet Take 50 mg by mouth.     . triamcinolone ointment (KENALOG) 0.1 % Apply to aa's psoriasis BID 5 days per week. Avoid face, groin, and underarms. 80 g 1  . albuterol (VENTOLIN HFA) 108 (90 Base) MCG/ACT inhaler Inhale 1-2 puffs into the lungs every 6 (six) hours as needed for wheezing or shortness of breath.     Marland Kitchen HYDROcodone-acetaminophen (NORCO/VICODIN) 5-325 MG tablet Take 1 tablet by mouth every 6 (six) hours as needed for moderate pain.  (Patient not taking: No sig reported)    . hydrocortisone (ANUSOL-HC) 2.5 % rectal cream Place 1 application rectally See admin instructions. Apply to the affected areas three times a day as directed. (Patient not taking: No sig reported)    . loratadine (CLARITIN) 10 MG tablet Take 10 mg by mouth daily. (Patient not taking: No sig reported)     No current facility-administered medications for this visit.    PHYSICAL EXAMINATION: ECOG PERFORMANCE STATUS: 1 - Symptomatic but completely ambulatory  BP 116/61   Pulse (!) 102   Temp (!) 97.2 F (36.2 C) (Tympanic)   Resp 20   SpO2 96%   There were no vitals filed for this visit.  Physical Exam Constitutional:      Comments: Thin built frail appearing cachectic female patient.  She is in a wheelchair.  Patient alone.  She is on home O2.  HENT:     Head: Normocephalic and atraumatic.     Mouth/Throat:     Pharynx: No oropharyngeal exudate.  Eyes:     Pupils: Pupils are equal, round, and reactive to light.  Cardiovascular:     Rate and Rhythm: Normal rate and regular rhythm.  Pulmonary:     Effort: No respiratory distress.     Breath sounds: No wheezing.   Abdominal:     General: Bowel sounds are normal. There is no distension.     Palpations: Abdomen is soft. There is no mass.     Tenderness: There is no abdominal tenderness. There is no guarding or rebound.  Musculoskeletal:        General: No tenderness. Normal range of motion.     Cervical back: Normal range of motion and neck supple.  Skin:    General: Skin is warm.     Comments: Dry scaly skin suggestive of psoriasis on the extensor surfaces.  Neurological:     Mental Status: She is alert and oriented to person, place, and time.  Psychiatric:  Mood and Affect: Affect normal.      LABORATORY DATA:  I have reviewed the data as listed    Component Value Date/Time   NA 136 08/13/2020 1250   NA 139 12/19/2012 0850   K 3.9 08/13/2020 1250   K 4.0 12/19/2012 0850   CL 97 (L) 08/13/2020 1250   CL 103 12/19/2012 0850   CO2 28 08/13/2020 1250   CO2 29 12/19/2012 0850   GLUCOSE 101 (H) 08/13/2020 1250   GLUCOSE 116 (H) 12/19/2012 0850   BUN 21 08/13/2020 1250   BUN 16 12/19/2012 0850   CREATININE 1.08 (H) 08/13/2020 1250   CREATININE 1.16 12/19/2012 0850   CALCIUM 8.8 (L) 08/13/2020 1250   CALCIUM 8.8 12/19/2012 0850   PROT 8.0 07/11/2019 1840   ALBUMIN 3.9 07/11/2019 1840   AST 22 07/11/2019 1840   ALT 15 07/11/2019 1840   ALKPHOS 81 07/11/2019 1840   BILITOT 0.7 07/11/2019 1840   GFRNONAA 50 (L) 08/13/2020 1250   GFRNONAA 45 (L) 12/19/2012 0850   GFRAA 47 (L) 01/31/2020 1300   GFRAA 52 (L) 12/19/2012 0850    No results found for: SPEP, UPEP  Lab Results  Component Value Date   WBC 15.8 (H) 08/13/2020   NEUTROABS PENDING 08/13/2020   HGB 9.9 (L) 08/13/2020   HCT 31.9 (L) 08/13/2020   MCV 94.1 08/13/2020   PLT 254 08/13/2020      Chemistry      Component Value Date/Time   NA 136 08/13/2020 1250   NA 139 12/19/2012 0850   K 3.9 08/13/2020 1250   K 4.0 12/19/2012 0850   CL 97 (L) 08/13/2020 1250   CL 103 12/19/2012 0850   CO2 28 08/13/2020 1250    CO2 29 12/19/2012 0850   BUN 21 08/13/2020 1250   BUN 16 12/19/2012 0850   CREATININE 1.08 (H) 08/13/2020 1250   CREATININE 1.16 12/19/2012 0850      Component Value Date/Time   CALCIUM 8.8 (L) 08/13/2020 1250   CALCIUM 8.8 12/19/2012 0850   ALKPHOS 81 07/11/2019 1840   AST 22 07/11/2019 1840   ALT 15 07/11/2019 1840   BILITOT 0.7 07/11/2019 1840       ASSESSMENT & PLAN:  Iron deficiency anemia due to chronic blood loss # Chronic anemia- at least from 2005 as per patient. Iron deficiency Anemia to be due to AV malformation vs ? Low grade MDS [no bone marrow Bx].  Hemoglobin is 9.9; . Continue PO iron.  Proceed with IV Venofer today.   # COPD- on home O2; 3 L -STABLE.   # Psoriasis- on steroids-defer to PCP/dermatology.  # DISPOSITION: ADD iron studies/ferritin-  # proceed with venofer today # follow up in 4 month - MD; cbc/bmp-posisble venofer- Dr.B  Cc; Dr.Miller.    Cammie Sickle, MD 08/13/2020 1:49 PM

## 2020-08-13 NOTE — Progress Notes (Signed)
Pt tolerated infusion well. No s/s of distress or reaction noted. Pt and VS stable at discharge.  

## 2020-08-16 ENCOUNTER — Telehealth: Payer: Self-pay

## 2020-08-16 NOTE — Telephone Encounter (Signed)
Rutherford Nail denied by insurance. Pt must try and fail or have contraindication to two of the folowing: Enbrel, Humira, Cosentyx, Tremfya, or Skyrizi.

## 2020-08-20 NOTE — Telephone Encounter (Signed)
Due to age of 31, I feel these systemic Biologic meds SHOTS which have immunosuppressant qualities are contraindicated unless the less significant oral NON-IMMUNOSUPPRESSANT medication Rutherford Nail is tried and fails.

## 2020-08-21 ENCOUNTER — Encounter: Payer: Self-pay | Admitting: Dermatology

## 2020-09-05 ENCOUNTER — Telehealth: Payer: Self-pay

## 2020-09-05 NOTE — Telephone Encounter (Signed)
[facility] called regarding patient's Rutherford Nail. She has been taking 1 PO QD as she states she is too scared to titrate up at this time. Patient comes in for a follow up next week.

## 2020-09-13 ENCOUNTER — Ambulatory Visit: Payer: Medicare Other | Admitting: Dermatology

## 2020-09-13 ENCOUNTER — Other Ambulatory Visit: Payer: Self-pay

## 2020-09-13 DIAGNOSIS — L409 Psoriasis, unspecified: Secondary | ICD-10-CM | POA: Diagnosis not present

## 2020-09-13 MED ORDER — CALCIPOTRIENE 0.005 % EX CREA: TOPICAL_CREAM | CUTANEOUS | 2 refills | Status: AC

## 2020-09-13 NOTE — Progress Notes (Signed)
   Follow-Up Visit   Subjective  Margaret [name] is a 87 y.o. female who presents for the following: Psoriasis (Psoriasis f/u. Treating with otezla 30 mg qd. Pt did not want to increase to BID due to pill causing diarrhea. Also using TMC 0.1% ointment on aa's BID 5 days/wk. Pt reports some improvement in the crusting. Legs still red. ).  The following portions of the chart were reviewed this encounter and updated as appropriate:  Tobacco  Allergies  Meds  Problems  Med Hx  Surg Hx  Fam Hx     Review of Systems: No other skin or systemic complaints except as noted in HPI or Assessment and Plan.  Objective  Well appearing patient in no apparent distress; mood and affect are within normal limits.  A focused examination was performed including legs. Relevant physical exam findings are noted in the Assessment and Plan.  Objective  b/l lower legs: Well-demarcated erythematous papules/plaques with silvery scale, guttate pink scaly papules.   Images      Assessment & Plan  Psoriasis - severe, but improved on oral systemic Otezla and topical Triamcinolone. b/l lower legs Continue Otezla 30 mg po. Take at the same time every day at Lunchtime to hopefully decrease diarrhea side effect.  No headache or depression or weight loss.  Start Calcipotriene cream. Apply to affected areas BID on Mondays Wednesdays and Fridays.   Continue TMC 0.1% oint on Tuesdays, Thursdays, and Saturdays.  Psoriasis is a chronic non-curable, but treatable genetic/hereditary disease that may have other systemic features affecting other organ systems such as joints (Psoriatic Arthritis). It is associated with an increased risk of inflammatory bowel disease, heart disease, non-alcoholic fatty liver disease, and depression.    calcipotriene (DOVONOX) 0.005 % cream - b/l lower legs Other Related Medications triamcinolone ointment (KENALOG) 0.1 % Apremilast (OTEZLA) 30 MG TABS  Return in about 1 month (around  10/14/2020) for psoriasis.   IHarriett Sine, CMA, am acting as scribe for Sarina Ser, MD.  Documentation: I have reviewed the above documentation for accuracy and completeness, and I agree with the above.  Sarina Ser, MD

## 2020-09-13 NOTE — Patient Instructions (Signed)
Continue Otezla 30 mg po. Take at the same time every day.   Start Calcipotriene cream. Apply to affected areas BID on Mondays Wednesdays and Fridays.   Continue TMC 0.1% oint on Tuesdays, Thursdays, and Saturdays.

## 2020-09-17 ENCOUNTER — Encounter: Payer: Self-pay | Admitting: Dermatology

## 2020-10-17 ENCOUNTER — Ambulatory Visit: Payer: Medicare Other | Admitting: Dermatology

## 2020-10-17 ENCOUNTER — Other Ambulatory Visit: Payer: Self-pay

## 2020-10-17 DIAGNOSIS — L409 Psoriasis, unspecified: Secondary | ICD-10-CM | POA: Diagnosis not present

## 2020-10-17 NOTE — Progress Notes (Signed)
   Follow-Up Visit   Subjective  Margaret [name] is a 87 y.o. female who presents for the following: Psoriasis (Patient currently using Otezla 30mg  po QD but is still experiencing diarrhea. She is also alternating the Calcipotriene and TMC creams and has noticed an improvement. ).  The following portions of the chart were reviewed this encounter and updated as appropriate:   Tobacco  Allergies  Meds  Problems  Med Hx  Surg Hx  Fam Hx     Review of Systems:  No other skin or systemic complaints except as noted in HPI or Assessment and Plan.  Objective  Well appearing patient in no apparent distress; mood and affect are within normal limits.  A focused examination was performed including B/L leg. Relevant physical exam findings are noted in the Assessment and Plan.  Objective  B/L lower legs: Some mild pink patches of the lower legs.  Images      Assessment & Plan  Psoriasis - much improved on systemic oral Otezla with decreased itch but some persistence - not to goal. B/L lower legs  Psoriasis is a chronic non-curable, but treatable genetic/hereditary disease that may have other systemic features affecting other organ systems such as joints (Psoriatic Arthritis). It is associated with an increased risk of inflammatory bowel disease, heart disease, non-alcoholic fatty liver disease, and depression.    Continue Calcipotriene BID on M, W, F, and TMC 0.1% oint BID on T, Th, and Sat. Topical steroids (such as triamcinolone, fluocinolone, fluocinonide, mometasone, clobetasol, halobetasol, betamethasone, hydrocortisone) can cause thinning and lightening of the skin if they are used for too long in the same area. Your physician has selected the right strength medicine for your problem and area affected on the body. Please use your medication only as directed by your physician to prevent side effects.    Due to diarrhea patient to cut Otezla 30mg  tablet in half and take a 15mg  po QD.  Side effects of Otezla (apremilast) include diarrhea, nausea, headache, upper respiratory infection, depression, and weight decrease (5-10%). It should only be taken by pregnant women after a discussion regarding risks and benefits with their doctor. Goal is control of skin condition, not cure.  The use of Rutherford Nail requires long term medication management, including periodic office visits.  If diarrhea continues to occur on 15mg  po QD then D/C and see how condition does with topical treatment only.   Other Related Medications triamcinolone ointment (KENALOG) 0.1 % Apremilast (OTEZLA) 30 MG TABS calcipotriene (DOVONOX) 0.005 % cream  Return in about 4 months (around 02/16/2021) for psoriasis follow up/Otezla .  Luther Redo, CMA, am acting as scribe for Sarina Ser, MD .  Documentation: I have reviewed the above documentation for accuracy and completeness, and I agree with the above.  Sarina Ser, MD

## 2020-10-17 NOTE — Patient Instructions (Signed)

## 2020-10-22 ENCOUNTER — Encounter: Payer: Self-pay | Admitting: Dermatology

## 2020-12-04 ENCOUNTER — Encounter: Payer: Self-pay | Admitting: Internal Medicine

## 2020-12-04 ENCOUNTER — Inpatient Hospital Stay: Payer: Medicare Other | Attending: Internal Medicine

## 2020-12-04 ENCOUNTER — Inpatient Hospital Stay: Payer: Medicare Other | Admitting: Internal Medicine

## 2020-12-04 ENCOUNTER — Inpatient Hospital Stay: Payer: Medicare Other

## 2020-12-04 VITALS — BP 118/54 | HR 88 | Temp 97.0°F | Resp 19

## 2020-12-04 DIAGNOSIS — D500 Iron deficiency anemia secondary to blood loss (chronic): Secondary | ICD-10-CM | POA: Diagnosis not present

## 2020-12-04 LAB — CBC WITH DIFFERENTIAL/PLATELET
Abs Immature Granulocytes: 0.08 10*3/uL — ABNORMAL HIGH (ref 0.00–0.07)
Basophils Absolute: 0.1 10*3/uL (ref 0.0–0.1)
Basophils Relative: 1 %
Eosinophils Absolute: 0.1 10*3/uL (ref 0.0–0.5)
Eosinophils Relative: 1 %
HCT: 32 % — ABNORMAL LOW (ref 36.0–46.0)
Hemoglobin: 10.2 g/dL — ABNORMAL LOW (ref 12.0–15.0)
Immature Granulocytes: 1 %
Lymphocytes Relative: 12 %
Lymphs Abs: 1.5 10*3/uL (ref 0.7–4.0)
MCH: 29.6 pg (ref 26.0–34.0)
MCHC: 31.9 g/dL (ref 30.0–36.0)
MCV: 92.8 fL (ref 80.0–100.0)
Monocytes Absolute: 6.1 10*3/uL — ABNORMAL HIGH (ref 0.1–1.0)
Monocytes Relative: 50 %
Neutro Abs: 4.3 10*3/uL (ref 1.7–7.7)
Neutrophils Relative %: 35 %
Platelets: 279 10*3/uL (ref 150–400)
RBC: 3.45 MIL/uL — ABNORMAL LOW (ref 3.87–5.11)
RDW: 14.6 % (ref 11.5–15.5)
Smear Review: NORMAL
WBC: 12.1 10*3/uL — ABNORMAL HIGH (ref 4.0–10.5)
nRBC: 0 % (ref 0.0–0.2)

## 2020-12-04 LAB — BASIC METABOLIC PANEL
Anion gap: 11 (ref 5–15)
BUN: 24 mg/dL — ABNORMAL HIGH (ref 8–23)
CO2: 31 mmol/L (ref 22–32)
Calcium: 9.4 mg/dL (ref 8.9–10.3)
Chloride: 99 mmol/L (ref 98–111)
Creatinine, Ser: 1.37 mg/dL — ABNORMAL HIGH (ref 0.44–1.00)
GFR, Estimated: 37 mL/min — ABNORMAL LOW (ref >60)
Glucose, Bld: 79 mg/dL (ref 70–99)
Potassium: 4.6 mmol/L (ref 3.5–5.1)
Sodium: 141 mmol/L (ref 135–145)

## 2020-12-04 MED ORDER — IRON SUCROSE 20 MG/ML IV SOLN
200.0000 mg | Freq: Once | INTRAVENOUS | Status: AC
  Administered 2020-12-04: 200 mg via INTRAVENOUS
  Filled 2020-12-04: qty 10

## 2020-12-04 MED ORDER — SODIUM CHLORIDE 0.9 % IV SOLN
Freq: Once | INTRAVENOUS | Status: AC
Start: 2020-12-04 — End: 2020-12-04
  Filled 2020-12-04: qty 250

## 2020-12-04 NOTE — Progress Notes (Signed)
Pt in for follow up, denies any concerns.

## 2020-12-04 NOTE — Progress Notes (Signed)
Margaret [name] OFFICE PROGRESS NOTE  Patient Care Team: Rusty Aus, MD as PCP - General (Internal Medicine)   SUMMARY OF ONCOLOGIC HISTORY:  # 2005 [atleast] ANEMIA- Iron def; ;?  Other reasons; W/Platelet- N; mild monocytosis [~2.5]; NO BMbx;Hx of Procrit in past;  IV Venofer q 3 M;   # Unprovoked DVT [2005] currently not on anticoagulation; COPD on 0 2 Emily  INTERVAL HISTORY:   87 year-old female patient with above history chronic anemia likely attributed to iron deficiency-question malabsorption/AV malformation/ ? MDS is here for follow-up.  Patient continues to complain of chronic fatigue.  She continues to be on 3 L of oxygen for COPD/chronic shortness of breath.  Denies any blood in stools or black-colored stools.  Review of Systems  Constitutional: Positive for malaise/fatigue and weight loss. Negative for chills, diaphoresis and fever.  HENT: Negative for nosebleeds and sore throat.   Eyes: Negative for double vision.  Respiratory: Positive for cough, sputum production and shortness of breath. Negative for hemoptysis and wheezing.   Cardiovascular: Negative for chest pain, palpitations, orthopnea and leg swelling.  Gastrointestinal: Negative for abdominal pain, blood in stool, constipation, diarrhea, heartburn, melena, nausea and vomiting.  Genitourinary: Negative for dysuria, frequency and urgency.  Musculoskeletal: Negative for back pain and joint pain.  Skin: Positive for itching and rash.  Neurological: Negative for dizziness, tingling, focal weakness, weakness and headaches.  Endo/Heme/Allergies: Does not bruise/bleed easily.  Psychiatric/Behavioral: Negative for depression. The patient is not nervous/anxious and does not have insomnia.      PAST MEDICAL HISTORY :  Past Medical History:  Diagnosis Date  . Asthma without status asthmaticus   . COPD, severe ([place])   . Disc disease, degenerative, lumbar or lumbosacral   . DVT (deep venous thrombosis)  ([place]) 2005  . Fibrocystic breast disease   . Gastric antral vascular ectasia   . History of uterine cancer   . HTN (hypertension)   . Hyperlipidemia   . Osteoporosis     PAST SURGICAL HISTORY :   Past Surgical History:  Procedure Laterality Date  . APPENDECTOMY    . CHOLECYSTECTOMY      FAMILY HISTORY :   Family History  Problem Relation Age of Onset  . Lung cancer Father     SOCIAL HISTORY:   Social History   Tobacco Use  . Smoking status: Former Smoker    Years: 25.00    Types: Cigarettes    Quit date: 12/05/1988    Years since quitting: 32.0  . Smokeless tobacco: Never Used    ALLERGIES:  is allergic to codeine, penicillins, and sulfa antibiotics.  MEDICATIONS:  Current Outpatient Medications  Medication Sig Dispense Refill  . albuterol (VENTOLIN HFA) 108 (90 Base) MCG/ACT inhaler Inhale 1-2 puffs into the lungs every 6 (six) hours as needed for wheezing or shortness of breath.     . ALPRAZolam (XANAX) 0.5 MG tablet Take 0.5 mg by mouth 3 (three) times daily as needed for anxiety.     Marland Kitchen Apremilast (OTEZLA) 30 MG TABS Take 1 tablet (30 mg total) by mouth 2 (two) times daily. 60 tablet 4  . Black Pepper-Turmeric 3-500 MG CAPS Take 500 mg by mouth daily.    . calcipotriene (DOVONOX) 0.005 % cream Apply twice a day on Mondays, Wednesdays, and Fridays. 60 g 2  . Cholecalciferol 50 MCG (2000 UT) TABS Take 2,000 Units by mouth daily.     Marland Kitchen docusate sodium (COLACE) 50 MG capsule Take 50 mg  by mouth daily.    Marland Kitchen donepezil (ARICEPT) 5 MG tablet Take 1 tablet by mouth at bedtime.    Marland Kitchen EPINEPHrine 0.3 mg/0.3 mL IJ SOAJ injection     . esomeprazole (NEXIUM) 20 MG capsule Take 20 mg by mouth daily at 12 noon.     . ferrous sulfate 325 (65 FE) MG tablet Take 325 mg by mouth daily.    Marland Kitchen ipratropium-albuterol (DUONEB) 0.5-2.5 (3) MG/3ML SOLN PLACE 1 VIAL IN NEBULIZER AND INHALE 3 TIMES A DAY    . loratadine (CLARITIN) 10 MG tablet Take 10 mg by mouth daily.    . Multiple  Vitamins-Minerals (BITOIN PLUS/CALCIUM/VIT D3) TABS Take 1 tablet by mouth daily.    . NON FORMULARY Vein/vascular vitamin-1 tablet once daily    . predniSONE (DELTASONE) 10 MG tablet Take by mouth.    . sertraline (ZOLOFT) 50 MG tablet Take 50 mg by mouth daily.     Marland Kitchen triamcinolone ointment (KENALOG) 0.1 % Apply to aa's psoriasis BID 5 days per week. Avoid face, groin, and underarms. 80 g 1  . HYDROcodone-acetaminophen (NORCO/VICODIN) 5-325 MG tablet Take 1 tablet by mouth every 6 (six) hours as needed for moderate pain.  (Patient not taking: No sig reported)    . hydrocortisone (ANUSOL-HC) 2.5 % rectal cream Place 1 application rectally See admin instructions. Apply to the affected areas three times a day as directed. (Patient not taking: No sig reported)    . traMADol (ULTRAM) 50 MG tablet Take 50 mg by mouth.  (Patient not taking: Reported on 12/04/2020)     No current facility-administered medications for this visit.    PHYSICAL EXAMINATION: ECOG PERFORMANCE STATUS: 1 - Symptomatic but completely ambulatory  BP 110/60 (BP Location: Left Arm, Patient Position: Sitting)   Pulse 68   Temp 97.6 F (36.4 C) (Tympanic)   Resp (!) 21   Wt 97 lb (44 kg)   SpO2 96%   BMI 18.33 kg/m   Filed Weights   12/04/20 1323  Weight: 97 lb (44 kg)    Physical Exam Constitutional:      Comments: Thin built frail appearing cachectic female patient.  She is in a wheelchair.  Patient alone.  She is on home O2.  HENT:     Head: Normocephalic and atraumatic.     Mouth/Throat:     Pharynx: No oropharyngeal exudate.  Eyes:     Pupils: Pupils are equal, round, and reactive to light.  Cardiovascular:     Rate and Rhythm: Normal rate and regular rhythm.  Pulmonary:     Effort: No respiratory distress.     Breath sounds: No wheezing.  Abdominal:     General: Bowel sounds are normal. There is no distension.     Palpations: Abdomen is soft. There is no mass.     Tenderness: There is no abdominal  tenderness. There is no guarding or rebound.  Musculoskeletal:        General: No tenderness. Normal range of motion.     Cervical back: Normal range of motion and neck supple.  Skin:    General: Skin is warm.     Comments: Dry scaly skin suggestive of psoriasis on the extensor surfaces.  Neurological:     Mental Status: She is alert and oriented to person, place, and time.  Psychiatric:        Mood and Affect: Affect normal.      LABORATORY DATA:  I have reviewed the data as listed  Component Value Date/Time   NA 141 12/04/2020 1246   NA 139 12/19/2012 0850   K 4.6 12/04/2020 1246   K 4.0 12/19/2012 0850   CL 99 12/04/2020 1246   CL 103 12/19/2012 0850   CO2 31 12/04/2020 1246   CO2 29 12/19/2012 0850   GLUCOSE 79 12/04/2020 1246   GLUCOSE 116 (H) 12/19/2012 0850   BUN 24 (H) 12/04/2020 1246   BUN 16 12/19/2012 0850   CREATININE 1.37 (H) 12/04/2020 1246   CREATININE 1.16 12/19/2012 0850   CALCIUM 9.4 12/04/2020 1246   CALCIUM 8.8 12/19/2012 0850   PROT 8.0 07/11/2019 1840   ALBUMIN 3.9 07/11/2019 1840   AST 22 07/11/2019 1840   ALT 15 07/11/2019 1840   ALKPHOS 81 07/11/2019 1840   BILITOT 0.7 07/11/2019 1840   GFRNONAA 37 (L) 12/04/2020 1246   GFRNONAA 45 (L) 12/19/2012 0850   GFRAA 47 (L) 01/31/2020 1300   GFRAA 52 (L) 12/19/2012 0850    No results found for: SPEP, UPEP  Lab Results  Component Value Date   WBC 12.1 (H) 12/04/2020   NEUTROABS PENDING 12/04/2020   HGB 10.2 (L) 12/04/2020   HCT 32.0 (L) 12/04/2020   MCV 92.8 12/04/2020   PLT 279 12/04/2020      Chemistry      Component Value Date/Time   NA 141 12/04/2020 1246   NA 139 12/19/2012 0850   K 4.6 12/04/2020 1246   K 4.0 12/19/2012 0850   CL 99 12/04/2020 1246   CL 103 12/19/2012 0850   CO2 31 12/04/2020 1246   CO2 29 12/19/2012 0850   BUN 24 (H) 12/04/2020 1246   BUN 16 12/19/2012 0850   CREATININE 1.37 (H) 12/04/2020 1246   CREATININE 1.16 12/19/2012 0850      Component Value  Date/Time   CALCIUM 9.4 12/04/2020 1246   CALCIUM 8.8 12/19/2012 0850   ALKPHOS 81 07/11/2019 1840   AST 22 07/11/2019 1840   ALT 15 07/11/2019 1840   BILITOT 0.7 07/11/2019 1840       ASSESSMENT & PLAN:  Iron deficiency anemia due to chronic blood loss # Chronic anemia- at least from 2005 as per patient. Iron deficiency Anemia to be due to AV malformation vs ? Low grade MDS [no bone marrow Bx].  Hemoglobin is 10.2 . Continue PO iron.  Proceed with IV Venofer today.   # COPD- on home O2; 3 L -STABLE  # Psoriasis- on steroids-defer to PCP/dermatology.  # DISPOSITION:  # proceed venofer today # follow up in 4 month - MD; cbc/bmp-posisble venofer- Dr.B  Cc; Dr.Miller.    Cammie Sickle, MD 12/04/2020 1:38 PM

## 2020-12-04 NOTE — Assessment & Plan Note (Addendum)
#   Chronic anemia- at least from 2005 as per patient. Iron deficiency Anemia to be due to AV malformation vs ? Low grade MDS [no bone marrow Bx].  Hemoglobin is 10.2 . Continue PO iron.  Proceed with IV Venofer today.   # COPD- on home O2; 3 L -STABLE  # Psoriasis- on steroids-defer to PCP/dermatology.  # DISPOSITION:  # proceed venofer today # follow up in 4 month - MD; cbc/bmp-posisble venofer- Dr.B  Cc; Dr.Miller.

## 2020-12-04 NOTE — Patient Instructions (Signed)

## 2021-03-13 ENCOUNTER — Ambulatory Visit: Payer: Medicare Other | Admitting: Dermatology

## 2021-03-13 ENCOUNTER — Other Ambulatory Visit: Payer: Self-pay

## 2021-03-13 DIAGNOSIS — L409 Psoriasis, unspecified: Secondary | ICD-10-CM

## 2021-03-13 NOTE — Progress Notes (Signed)
   Follow-Up Visit   Subjective  Margaret [name] is a 87 y.o. female who presents for the following: Psoriasis (4 month follow up - Stopped Otezla for the summer. Calcipotriene cream qd Monday, Wednesday, Friday. TMC 0.1% cream qs Tuesday, Thursday). The patient is very pleased with her results and is clear on her lower legs.  She has never been this good for years. Accompanied by friend who contributes to history.  The following portions of the chart were reviewed this encounter and updated as appropriate:   Tobacco  Allergies  Meds  Problems  Med Hx  Surg Hx  Fam Hx     Review of Systems:  No other skin or systemic complaints except as noted in HPI or Assessment and Plan.  Objective  Well appearing patient in no apparent distress; mood and affect are within normal limits.  A focused examination was performed including legs. Relevant physical exam findings are noted in the Assessment and Plan.  Legs Clear today   Assessment & Plan  Psoriasis Legs  Psoriasis is a chronic non-curable, but treatable genetic/hereditary disease that may have other systemic features affecting other organ systems such as joints (Psoriatic Arthritis). It is associated with an increased risk of inflammatory bowel disease, heart disease, non-alcoholic fatty liver disease, and depression.    Well controlled - may restart [place] N[place] if she flares.  Continue Calcipotriene cream qd M, W, F and TMC 0.1% cream qd T, Th - She does not need refills today and she would rather have the pharmacist call for refills when she needs them.  Related Medications triamcinolone ointment (KENALOG) 0.1 % Apply to aa's psoriasis BID 5 days per week. Avoid face, groin, and underarms.  Apremilast (OTEZLA) 30 MG TABS Take 1 tablet (30 mg total) by mouth 2 (two) times daily.  calcipotriene (DOVONOX) 0.005 % cream Apply twice a day on Mondays, Wednesdays, and Fridays.  Return in about 1 year (around 03/13/2022).  I, Ashok Cordia, CMA, am acting as scribe for Sarina Ser, MD . Documentation: I have reviewed the above documentation for accuracy and completeness, and I agree with the above.  Sarina Ser, MD

## 2021-03-13 NOTE — Patient Instructions (Signed)

## 2021-03-14 ENCOUNTER — Encounter: Payer: Self-pay | Admitting: Dermatology

## 2021-04-04 ENCOUNTER — Other Ambulatory Visit: Payer: Self-pay | Admitting: *Deleted

## 2021-04-04 DIAGNOSIS — D500 Iron deficiency anemia secondary to blood loss (chronic): Secondary | ICD-10-CM

## 2021-04-09 ENCOUNTER — Inpatient Hospital Stay: Payer: Medicare Other | Admitting: Nurse Practitioner

## 2021-04-09 ENCOUNTER — Inpatient Hospital Stay: Payer: Medicare Other

## 2021-12-17 ENCOUNTER — Other Ambulatory Visit: Payer: Self-pay

## 2021-12-17 ENCOUNTER — Encounter: Payer: Self-pay | Admitting: Emergency Medicine

## 2021-12-17 ENCOUNTER — Emergency Department: Payer: Medicare Other

## 2021-12-17 ENCOUNTER — Observation Stay
Admission: EM | Admit: 2021-12-17 | Discharge: 2021-12-18 | Disposition: A | Discharge status: Home / Self Care | Payer: Medicare Other | Attending: Emergency Medicine | Admitting: Emergency Medicine

## 2021-12-17 DIAGNOSIS — R042 Hemoptysis: Secondary | ICD-10-CM | POA: Insufficient documentation

## 2021-12-17 DIAGNOSIS — D1430 Benign neoplasm of unspecified bronchus and lung: Secondary | ICD-10-CM | POA: Diagnosis not present

## 2021-12-17 DIAGNOSIS — R799 Abnormal finding of blood chemistry, unspecified: Secondary | ICD-10-CM | POA: Insufficient documentation

## 2021-12-17 DIAGNOSIS — J449 Chronic obstructive pulmonary disease, unspecified: Secondary | ICD-10-CM | POA: Diagnosis not present

## 2021-12-17 DIAGNOSIS — R0602 Shortness of breath: Secondary | ICD-10-CM | POA: Insufficient documentation

## 2021-12-17 DIAGNOSIS — R109 Unspecified abdominal pain: Secondary | ICD-10-CM | POA: Insufficient documentation

## 2021-12-17 DIAGNOSIS — D72829 Elevated white blood cell count, unspecified: Secondary | ICD-10-CM | POA: Insufficient documentation

## 2021-12-17 DIAGNOSIS — N189 Chronic kidney disease, unspecified: Secondary | ICD-10-CM | POA: Diagnosis not present

## 2021-12-17 DIAGNOSIS — R918 Other nonspecific abnormal finding of lung field: Secondary | ICD-10-CM

## 2021-12-17 DIAGNOSIS — D649 Anemia, unspecified: Principal | ICD-10-CM | POA: Diagnosis present

## 2021-12-17 LAB — BASIC METABOLIC PANEL
Anion gap: 9 (ref 5–15)
BUN: 24 mg/dL — ABNORMAL HIGH (ref 8–23)
CO2: 33 mmol/L — ABNORMAL HIGH (ref 22–32)
Calcium: 8.5 mg/dL — ABNORMAL LOW (ref 8.9–10.3)
Chloride: 101 mmol/L (ref 98–111)
Creatinine, Ser: 1.15 mg/dL — ABNORMAL HIGH (ref 0.44–1.00)
GFR, Estimated: 46 mL/min — ABNORMAL LOW (ref >60)
Glucose, Bld: 146 mg/dL — ABNORMAL HIGH (ref 70–99)
Potassium: 4 mmol/L (ref 3.5–5.1)
Sodium: 143 mmol/L (ref 135–145)

## 2021-12-17 LAB — CBC
HCT: 21.6 % — ABNORMAL LOW (ref 36.0–46.0)
Hemoglobin: 5.8 g/dL — ABNORMAL LOW (ref 12.0–15.0)
MCH: 19.9 pg — ABNORMAL LOW (ref 26.0–34.0)
MCHC: 26.9 g/dL — ABNORMAL LOW (ref 30.0–36.0)
MCV: 74 fL — ABNORMAL LOW (ref 80.0–100.0)
Platelets: 319 10*3/uL (ref 150–400)
RBC: 2.92 MIL/uL — ABNORMAL LOW (ref 3.87–5.11)
RDW: 20.3 % — ABNORMAL HIGH (ref 11.5–15.5)
WBC: 15.6 10*3/uL — ABNORMAL HIGH (ref 4.0–10.5)
nRBC: 0 % (ref 0.0–0.2)

## 2021-12-17 LAB — PREPARE RBC (CROSSMATCH)

## 2021-12-17 MED ORDER — IOHEXOL 300 MG/ML  SOLN
60.0000 mL | Freq: Once | INTRAMUSCULAR | Status: AC | PRN
  Administered 2021-12-17: 60 mL via INTRAVENOUS

## 2021-12-17 MED ORDER — ONDANSETRON HCL 4 MG/2ML IJ SOLN: 4.0000 mg | Freq: Four times a day (QID) | INTRAMUSCULAR | Status: DC | PRN

## 2021-12-17 MED ORDER — ACETAMINOPHEN 325 MG RE SUPP: 650.0000 mg | Freq: Four times a day (QID) | RECTAL | Status: DC | PRN

## 2021-12-17 MED ORDER — ALBUTEROL SULFATE (2.5 MG/3ML) 0.083% IN NEBU: 2.5000 mg | INHALATION_SOLUTION | RESPIRATORY_TRACT | Status: DC | PRN

## 2021-12-17 MED ORDER — SODIUM CHLORIDE 0.9 % IV SOLN
10.0000 mL/h | Freq: Once | INTRAVENOUS | Status: AC
  Administered 2021-12-17: 10 mL/h via INTRAVENOUS

## 2021-12-17 MED ORDER — ACETAMINOPHEN 325 MG PO TABS: 650.0000 mg | ORAL_TABLET | Freq: Four times a day (QID) | ORAL | Status: DC | PRN

## 2021-12-17 MED ORDER — SODIUM CHLORIDE 0.9% FLUSH
3.0000 mL | Freq: Two times a day (BID) | INTRAVENOUS | Status: DC
  Administered 2021-12-18 (×2): 3 mL via INTRAVENOUS

## 2021-12-17 MED ORDER — FUROSEMIDE 10 MG/ML IJ SOLN
20.0000 mg | Freq: Once | INTRAMUSCULAR | Status: AC
  Administered 2021-12-17: 20 mg via INTRAVENOUS
  Filled 2021-12-17: qty 4

## 2021-12-17 MED ORDER — SODIUM CHLORIDE 0.9 % IV SOLN: INTRAVENOUS | Status: DC

## 2021-12-17 MED ORDER — ONDANSETRON HCL 4 MG PO TABS: 4.0000 mg | ORAL_TABLET | Freq: Four times a day (QID) | ORAL | Status: DC | PRN

## 2021-12-17 NOTE — ED Provider Notes (Addendum)
Need to  [facility] Provider Note    Event Date/Time   First MD Initiated Contact with Patient 12/15/2021 1642     (approximate)   History   Chief Complaint Abnormal Lab   HPI Margaret [name] is a 88 y.o. female, history of COPD, DVT, hyperlipidemia, gastric antral vascular ectasia, lipid disease, CKD, presents to the emergency department for evaluation of low hemoglobin.  Patient additionally states she has been having hemoptysis and shortness of breath for the past 2 weeks.  Additionally complaining of right-sided abdominal pain, described as a burning sensation.  She states that she has chronic iron deficiency anemia due to suspected AV malformation. She states she regularly gets iron infusions, however does not usually need blood transfusions.  Denies fever/chills, chest pain, flank pain, nausea/vomiting, hematemesis, hematochezia, dark tarry stools, dysuria, headache, or lightheadedness/dizziness.   History Limitations: No limitations.        Physical Exam  Triage Vital Signs: ED Triage Vitals  Enc Vitals Group     BP 12/14/2021 1453 (!) 161/72     Pulse Rate 01/06/2022 1453 (!) 107     Resp 12/21/2021 1453 20     Temp 01/06/2022 1453 98.4 F (36.9 C)     Temp Source 12/29/2021 1453 Oral     SpO2 01/05/2022 1453 95 %     Weight 12/16/2021 1450 99 lb 3.3 oz (45 kg)     Height 01/02/2022 1450 5\' 1"  (1.549 m)     Head Circumference --      Peak Flow --      Pain Score 01/01/2022 1450 0     Pain Loc --      Pain Edu? --      Excl. in [place]? --     Most recent vital signs: Vitals:   12/15/2021 2245 01/06/2022 2300  BP: (!) 162/60 (!) 164/63  Pulse: 88 88  Resp:    Temp:    SpO2: 100% 100%    General: Awake, NAD.  Skin: Warm, dry. No rashes or lesions.  Eyes: PERRL. Conjunctivae normal.  CV: Good peripheral perfusion.  Resp: Normal effort.  Lung sounds are clear bilaterally. Abd: Soft, non-tender. No distention.  Neuro: At baseline. No gross neurological  deficits.   Focused Exam: Appears pale and physically weak.   Physical Exam    ED Results / Procedures / Treatments  Labs (all labs ordered are listed, but only abnormal results are displayed) Labs Reviewed  BASIC METABOLIC PANEL - Abnormal; Notable for the following components:      Result Value   CO2 33 (*)    Glucose, Bld 146 (*)    BUN 24 (*)    Creatinine, Ser 1.15 (*)    Calcium 8.5 (*)    GFR, Estimated 46 (*)    All other components within normal limits  CBC - Abnormal; Notable for the following components:   WBC 15.6 (*)    RBC 2.92 (*)    Hemoglobin 5.8 (*)    HCT 21.6 (*)    MCV 74.0 (*)    MCH 19.9 (*)    MCHC 26.9 (*)    RDW 20.3 (*)    All other components within normal limits  URINALYSIS, ROUTINE W REFLEX MICROSCOPIC  OCCULT BLOOD X 1 CARD TO LAB, STOOL  COMPREHENSIVE METABOLIC PANEL  CBC  PROTIME-INR  TYPE AND SCREEN  PREPARE RBC (CROSSMATCH)     EKG Sinus tachycardia, no significant ST segment changes, no AV blocks,  no QT prolongation, normal QRS interval   RADIOLOGY  ED Provider Interpretation: I personally reviewed and interpreted this chest x-ray.  Left perihilar nodule/mass present.   DG Chest 2 View  Result Date: 12/12/2021 CLINICAL DATA:  Shortness of breath. EXAM: CHEST - 2 VIEW COMPARISON:  Chest radiograph 07/11/2019. FINDINGS: Possible left perihilar nodule/mass. Small left pleural effusion. No visible pneumothorax. Emphysema. Similar cardiomediastinal silhouette. Exaggerated thoracic kyphosis with chronic compression fractures, similar. Osteopenia. IMPRESSION: 1. Possible left perihilar nodule/mass. Recommend CT chest (preferably with contrast) to further evaluate for malignancy. 2. Small left pleural effusion. 3. Emphysema. Electronically Signed   By: Margaretha Sheffield M.D.   On: 12/21/2021 15:28   CT CHEST ABDOMEN PELVIS W CONTRAST  Result Date: 12/22/2021 CLINICAL DATA:  Hemoptysis and shortness of breath x2 weeks. EXAM: CT CHEST,  ABDOMEN, AND PELVIS WITH CONTRAST TECHNIQUE: Multidetector CT imaging of the chest, abdomen and pelvis was performed following the standard protocol during bolus administration of intravenous contrast. RADIATION DOSE REDUCTION: This exam was performed according to the departmental dose-optimization program which includes automated exposure control, adjustment of the mA and/or kV according to patient size and/or use of iterative reconstruction technique. CONTRAST:  65mL OMNIPAQUE IOHEXOL 300 MG/ML  SOLN COMPARISON:  July 13, 2019 FINDINGS: CT CHEST FINDINGS Cardiovascular: There is marked severity calcification of the thoracic aorta, without evidence of aortic aneurysm. Normal heart size. No pericardial effusion. Mediastinum/Nodes: No enlarged mediastinal, hilar, or axillary lymph nodes. Thyroid gland, trachea, and esophagus demonstrate no significant findings. Lungs/Pleura: There is marked severity emphysematous lung disease. Mild areas of linear scarring and/or atelectasis are seen within the anteromedial and posteromedial aspect of the right lung base and posterolateral aspect of the left lung base. A 7.2 cm x 4.0 cm x 8.3 cm heterogeneous, complex mass is seen within the anteromedial aspect of the left upper lobe. A 2.7 cm x 1.6 cm x 2.0 cm heterogeneous low-attenuation lung mass is also seen within the posterior aspect of the left lower lobe. There is a small left pleural effusion. No pneumothorax is identified. Musculoskeletal: Marked severity kyphosis of the mid and lower thoracic spine is seen. CT ABDOMEN PELVIS FINDINGS Hepatobiliary: No focal liver abnormality is seen. Status post cholecystectomy. No biliary dilatation. Pancreas: Unremarkable. No pancreatic ductal dilatation or surrounding inflammatory changes. Spleen: Normal in size without focal abnormality. Adrenals/Urinary Tract: Adrenal glands are unremarkable. Kidneys are normal, without renal calculi, focal lesion, or hydronephrosis. Bladder is  unremarkable. Stomach/Bowel: Stomach is within normal limits. The appendix is surgically absent a large amount of stool is seen throughout the colon. No evidence of bowel dilatation. Noninflamed diverticula are seen throughout the large bowel. Vascular/Lymphatic: Aortic atherosclerosis. No enlarged abdominal or pelvic lymph nodes. Reproductive: Status post hysterectomy. No adnexal masses. Other: No abdominal wall hernia or abnormality. No abdominopelvic ascites. Musculoskeletal: There is grade 2 anterolisthesis of the L5 vertebral body on S1. IMPRESSION: 1. Marked severity emphysematous lung disease. 2. 7.2 cm x 4.0 cm x 8.3 cm left upper lobe lung mass consistent with a primary lung malignancy. 3. 2.7 cm x 1.6 cm x 2.0 cm left lower lobe lung mass consistent with a second neoplastic lesion. 4. Small left pleural effusion. 5. Colonic diverticulosis. 6. Grade 2 anterolisthesis of the L5 vertebral body on S1. Aortic Atherosclerosis (ICD10-I70.0) and Emphysema (ICD10-J43.9). Electronically Signed   By: Virgina Norfolk M.D.   On: 12/20/2021 20:48    PROCEDURES:  Critical Care performed: N/A.  Procedures    MEDICATIONS ORDERED IN ED: Medications  sodium chloride flush (NS) 0.9 % injection 3 mL (has no administration in time range)  0.9 %  sodium chloride infusion (has no administration in time range)  acetaminophen (TYLENOL) tablet 650 mg (has no administration in time range)    Or  acetaminophen (TYLENOL) suppository 650 mg (has no administration in time range)  ondansetron (ZOFRAN) tablet 4 mg (has no administration in time range)    Or  ondansetron (ZOFRAN) injection 4 mg (has no administration in time range)  albuterol (PROVENTIL) (2.5 MG/3ML) 0.083% nebulizer solution 2.5 mg (has no administration in time range)  0.9 %  sodium chloride infusion (10 mL/hr Intravenous New Bag/Given 12/14/2021 1729)  iohexol (OMNIPAQUE) 300 MG/ML solution 60 mL (60 mLs Intravenous Contrast Given 01/01/2022 2027)   furosemide (LASIX) injection 20 mg (20 mg Intravenous Given 12/27/2021 1856)     IMPRESSION / MDM / ASSESSMENT AND PLAN / ED COURSE  I reviewed the triage vital signs and the nursing notes.   Differential diagnosis includes, but is not limited to, malignancy, GI bleed, anemia,  ED Course Patient appears pale and weak, but stable.  CBC notable for leukocytosis at 15.6 and severe anemia at 5.8.  We will go ahead initiate 2 units PRBCs.  BMP shows mildly elevated creatinine at 1.15, otherwise no significant abnormalities.  Assessment/Plan Patient presents with symptomatic anemia with a hemoglobin of 5.8.  Initiated 2 units PRBCs.  Currently pending CT scans of the chest/abdomen to further evaluate the abdominal pain and hemoptysis.  Patient care transferred over to Dr. Acquanetta Belling at 2020.   Patient's presentation is most consistent with acute presentation with potential threat to life or bodily function.        FINAL CLINICAL IMPRESSION(S) / ED DIAGNOSES   Final diagnoses:  Symptomatic anemia  Lung mass     Rx / DC Orders   ED Discharge Orders     None        Note:  This document was prepared using Dragon voice recognition software and may include unintentional dictation errors.   Teodoro Spray, [place] 12/22/2021 2021    Nena Polio, MD 12/19/2021 2037 I went to see the patient.  She just got off the toilet.  There is no blood in the toilet nice brown stool there.  Patient says she has not had any black stool.  She says she was just coughing up a little bit of pink fluid.  She says this has been going on for years and she has not really interested in having a whole lot of testing done she says.   Nena Polio, MD 01/10/2022 2218 Discussed this with the hospitalist who will get her in the hospital.  We will get her lung masses evaluated and see why she became so anemic he is 62 possible vascular ectasia and the get or was she having more hemoptysis than she  described.   Nena Polio, MD 12/22/[phone]

## 2021-12-17 NOTE — ED Triage Notes (Signed)
Pt via POV from home. Pt c/o abnormal labs, pt's hgb 5.3. Pt has been having hemoptysis and SOB for the past 2 weeks. Pt has been c/o R sided abd pain. Pt has a hx of iron deficiency anemia.    Pt is HOH. Pt is poor historian. Pt is A&Ox4 and NAD

## 2021-12-17 NOTE — H&P (Signed)
History and Physical    Margaret [name]:[id number] DOB: Feb 27, 1931 DOA: 12/12/2021  PCP: Rusty Aus, MD  Patient coming from: home  I have personally briefly reviewed patient's old medical records in [place]  Chief Complaint: abn lab hgb of 5.3 , hx of hemoptysis and sob x 2 weeks   HPI: Margaret [name] is a 88 y.o. female with medical history significant of  Asthma/COPD on chronic home O2 3L, hx of DVT, IDA thought to be due to chronic gi loss related to Gastric AVM,HTN, HLD, who presents to ED due to abn lab noting hgb of 5.3 down from her  10 around 2 weeks ago. In addition to complaints of hemoptysis x 2 weeks as well as sob. Patient denies current sob/chest pain /n/v/d. She does note coughing up blood but denies blood in stool. She is alert to self and place but note time but is able to remember her new diagnosis as discussed with her by prior MD.   ED Course:  Afeb, bp 168/72, hr 107, sat 95% on 3L , rr 20  Labs: Wbc:15.6, Hgb 5.8,  (10.2 11/12/20) plt 319, MCV 74  Na 143, K 4, bicarb 33, gly 146, cr 1.15,  Ekg: Sinus tachycardia, pac, st -t waver changes noted  cxr IMPRESSION: 1. Possible left perihilar nodule/mass. Recommend CT chest (preferably with contrast) to further evaluate for malignancy. 2. Small left pleural effusion. 3. Emphysema.    CT abd/pelvis 1. Marked severity emphysematous lung disease. 2. 7.2 cm x 4.0 cm x 8.3 cm left upper lobe lung mass consistent with a primary lung malignancy. 3. 2.7 cm x 1.6 cm x 2.0 cm left lower lobe lung mass consistent with a second neoplastic lesion. 4. Small left pleural effusion. 5. Colonic diverticulosis. 6. Grade 2 anterolisthesis of the L5 vertebral body on S1.   Tx transfused  1 units prbc in /lasix 20 given s/p prbc  Review of Systems: As per HPI otherwise 10 point review of systems negative.   Past Medical History:  Diagnosis Date   Asthma without status asthmaticus    COPD, severe (HCC)    Disc  disease, degenerative, lumbar or lumbosacral    DVT (deep venous thrombosis) ([place]) 2005   Fibrocystic breast disease    Gastric antral vascular ectasia    History of uterine cancer    HTN (hypertension)    Hyperlipidemia    Osteoporosis     Past Surgical History:  Procedure Laterality Date   APPENDECTOMY     CHOLECYSTECTOMY       reports that she quit smoking about 33 years ago. Her smoking use included cigarettes. She has never used smokeless tobacco. No history on file for alcohol use and drug use.  Allergies  Allergen Reactions   Codeine Nausea And Vomiting   Penicillins Rash   Sulfa Antibiotics Rash    Family History  Problem Relation Age of Onset   Lung cancer Father     Prior to Admission medications   Medication Sig Start Date End Date Taking? Authorizing Provider  albuterol (VENTOLIN HFA) 108 (90 Base) MCG/ACT inhaler Inhale 1-2 puffs into the lungs every 6 (six) hours as needed for wheezing or shortness of breath.  04/18/15   [provider]  ALPRAZolam Duanne Moron) 0.5 MG tablet Take 0.5 mg by mouth 3 (three) times daily as needed for anxiety.  10/09/14   [provider]  Apremilast (OTEZLA) 30 MG TABS Take 1 tablet (30 mg total) by  mouth 2 (two) times daily. 08/09/20   Ralene Bathe, MD  Black Pepper-Turmeric 3-500 MG CAPS Take 500 mg by mouth daily.    [provider]  calcipotriene (DOVONOX) 0.005 % cream Apply twice a day on Mondays, Wednesdays, and Fridays. 09/13/20   Ralene Bathe, MD  Cholecalciferol 50 MCG (2000 UT) TABS Take 2,000 Units by mouth daily.     [provider]  docusate sodium (COLACE) 50 MG capsule Take 50 mg by mouth daily.    [provider]  donepezil (ARICEPT) 5 MG tablet Take 1 tablet by mouth at bedtime. 11/23/20   [provider]  EPINEPHrine 0.3 mg/0.3 mL IJ SOAJ injection  03/12/16   [provider]  esomeprazole (NEXIUM) 20 MG capsule Take 20 mg by mouth daily at 12 noon.   04/23/16   [provider]  ferrous sulfate 325 (65 FE) MG tablet Take 325 mg by mouth daily.    [provider]  HYDROcodone-acetaminophen (NORCO/VICODIN) 5-325 MG tablet Take 1 tablet by mouth every 6 (six) hours as needed for moderate pain.  Patient not taking: No sig reported 03/12/15   [provider]  hydrocortisone (ANUSOL-HC) 2.5 % rectal cream Place 1 application rectally See admin instructions. Apply to the affected areas three times a day as directed. Patient not taking: No sig reported 01/10/19   [provider]  ipratropium-albuterol (DUONEB) 0.5-2.5 (3) MG/3ML SOLN PLACE 1 VIAL IN NEBULIZER AND INHALE 3 TIMES A DAY 04/08/16   [provider]  loratadine (CLARITIN) 10 MG tablet Take 10 mg by mouth daily.    [provider]  Multiple Vitamins-Minerals (BITOIN PLUS/CALCIUM/VIT D3) TABS Take 1 tablet by mouth daily.    [provider]  NON FORMULARY Vein/vascular vitamin-1 tablet once daily    [provider]  sertraline (ZOLOFT) 50 MG tablet Take 50 mg by mouth daily.  04/18/15   [provider]  traMADol (ULTRAM) 50 MG tablet Take 50 mg by mouth.  Patient not taking: Reported on 12/04/2020 04/23/16   [provider]  triamcinolone ointment (KENALOG) 0.1 % Apply to aa's psoriasis BID 5 days per week. Avoid face, groin, and underarms. 08/09/20   Ralene Bathe, MD    Physical Exam: Vitals:   12/14/2021 1815 12/20/2021 2025 12/29/2021 2109 01/04/2022 2128  BP:  (!) 168/81 (!) 173/83 (!) 170/93  Pulse: (!) 110 98 (!) 103 (!) 103  Resp:  16 18 18   Temp:  98 F (36.7 C) 98.6 F (37 C) 98 F (36.7 C)  TempSrc:  Oral Oral Oral  SpO2: 100%   99%  Weight:      Height:         Vitals:   12/16/2021 1815 01/10/2022 2025 12/12/2021 2109 12/29/2021 2128  BP:  (!) 168/81 (!) 173/83 (!) 170/93  Pulse: (!) 110 98 (!) 103 (!) 103  Resp:  16 18 18   Temp:  98 F (36.7 C) 98.6 F (37 C) 98 F (36.7 C)  TempSrc:   Oral Oral Oral  SpO2: 100%   99%  Weight:      Height:      Constitutional: NAD, calm, comfortable Eyes: PERRL, lids and conjunctivae normal ENMT: Mucous membranes are moist. Posterior pharynx clear of any exudate or lesions.Normal dentition.  Neck: normal, supple, no masses, no thyromegaly Respiratory: clear to auscultation bilaterally, no wheezing, no crackles. Normal respiratory effort. No accessory muscle use.  Cardiovascular: Regular rate and rhythm, no murmurs / rubs /  gallops. +extremity edema. 2+ pedal pulses.  Abdomen: no tenderness, no masses palpated. No hepatosplenomegaly. Bowel sounds positive.  Musculoskeletal: no clubbing / cyanosis. No joint deformity upper and lower extremities. Good ROM, no contractures. Normal muscle tone. Kyphotic spine Skin: no rashes, lesions, ulcers. No induration Neurologic: CN 2-12 grossly intact. Sensation intact, . Strength 5/5 in all 4.  Psychiatric: Normal judgment and insight. Alert and oriented x 2. Normal mood.    Labs on Admission: I have personally reviewed following labs and imaging studies  CBC: Recent Labs  Lab 12/31/2021 1502  WBC 15.6*  HGB 5.8*  HCT 21.6*  MCV 74.0*  PLT 376   Basic Metabolic Panel: Recent Labs  Lab 01/09/2022 1502  NA 143  K 4.0  CL 101  CO2 33*  GLUCOSE 146*  BUN 24*  CREATININE 1.15*  CALCIUM 8.5*   GFR: Estimated Creatinine Clearance: 24 mL/min (A) (by C-G formula based on SCr of 1.15 mg/dL (H)). Liver Function Tests: No results for input(s): AST, ALT, ALKPHOS, BILITOT, PROT, ALBUMIN in the last 168 hours. No results for input(s): LIPASE, AMYLASE in the last 168 hours. No results for input(s): AMMONIA in the last 168 hours. Coagulation Profile: No results for input(s): INR, PROTIME in the last 168 hours. Cardiac Enzymes: No results for input(s): CKTOTAL, CKMB, CKMBINDEX, TROPONINI in the last 168 hours. BNP (last 3 results) No results for input(s): PROBNP in the last 8760 hours. HbA1C: No  results for input(s): HGBA1C in the last 72 hours. CBG: No results for input(s): GLUCAP in the last 168 hours. Lipid Profile: No results for input(s): CHOL, HDL, LDLCALC, TRIG, CHOLHDL, LDLDIRECT in the last 72 hours. Thyroid Function Tests: No results for input(s): TSH, T4TOTAL, FREET4, T3FREE, THYROIDAB in the last 72 hours. Anemia Panel: No results for input(s): VITAMINB12, FOLATE, FERRITIN, TIBC, IRON, RETICCTPCT in the last 72 hours. Urine analysis:    Component Value Date/Time   COLORURINE YELLOW (A) 07/12/2019 0159   APPEARANCEUR CLOUDY (A) 07/12/2019 0159   LABSPEC 1.023 07/12/2019 0159   PHURINE 5.0 07/12/2019 0159   GLUCOSEU NEGATIVE 07/12/2019 0159   HGBUR NEGATIVE 07/12/2019 0159   BILIRUBINUR NEGATIVE 07/12/2019 0159   KETONESUR NEGATIVE 07/12/2019 0159   PROTEINUR 100 (A) 07/12/2019 0159   NITRITE NEGATIVE 07/12/2019 0159   LEUKOCYTESUR SMALL (A) 07/12/2019 0159    Radiological Exams on Admission: DG Chest 2 View  Result Date: 12/21/2021 CLINICAL DATA:  Shortness of breath. EXAM: CHEST - 2 VIEW COMPARISON:  Chest radiograph 07/11/2019. FINDINGS: Possible left perihilar nodule/mass. Small left pleural effusion. No visible pneumothorax. Emphysema. Similar cardiomediastinal silhouette. Exaggerated thoracic kyphosis with chronic compression fractures, similar. Osteopenia. IMPRESSION: 1. Possible left perihilar nodule/mass. Recommend CT chest (preferably with contrast) to further evaluate for malignancy. 2. Small left pleural effusion. 3. Emphysema. Electronically Signed   By: Margaretha Sheffield M.D.   On: 12/27/2021 15:28   CT CHEST ABDOMEN PELVIS W CONTRAST  Result Date: 01/06/2022 CLINICAL DATA:  Hemoptysis and shortness of breath x2 weeks. EXAM: CT CHEST, ABDOMEN, AND PELVIS WITH CONTRAST TECHNIQUE: Multidetector CT imaging of the chest, abdomen and pelvis was performed following the standard protocol during bolus administration of intravenous contrast. RADIATION DOSE  REDUCTION: This exam was performed according to the departmental dose-optimization program which includes automated exposure control, adjustment of the mA and/or kV according to patient size and/or use of iterative reconstruction technique. CONTRAST:  52mL OMNIPAQUE IOHEXOL 300 MG/ML  SOLN COMPARISON:  July 13, 2019 FINDINGS: CT CHEST FINDINGS Cardiovascular: There is  marked severity calcification of the thoracic aorta, without evidence of aortic aneurysm. Normal heart size. No pericardial effusion. Mediastinum/Nodes: No enlarged mediastinal, hilar, or axillary lymph nodes. Thyroid gland, trachea, and esophagus demonstrate no significant findings. Lungs/Pleura: There is marked severity emphysematous lung disease. Mild areas of linear scarring and/or atelectasis are seen within the anteromedial and posteromedial aspect of the right lung base and posterolateral aspect of the left lung base. A 7.2 cm x 4.0 cm x 8.3 cm heterogeneous, complex mass is seen within the anteromedial aspect of the left upper lobe. A 2.7 cm x 1.6 cm x 2.0 cm heterogeneous low-attenuation lung mass is also seen within the posterior aspect of the left lower lobe. There is a small left pleural effusion. No pneumothorax is identified. Musculoskeletal: Marked severity kyphosis of the mid and lower thoracic spine is seen. CT ABDOMEN PELVIS FINDINGS Hepatobiliary: No focal liver abnormality is seen. Status post cholecystectomy. No biliary dilatation. Pancreas: Unremarkable. No pancreatic ductal dilatation or surrounding inflammatory changes. Spleen: Normal in size without focal abnormality. Adrenals/Urinary Tract: Adrenal glands are unremarkable. Kidneys are normal, without renal calculi, focal lesion, or hydronephrosis. Bladder is unremarkable. Stomach/Bowel: Stomach is within normal limits. The appendix is surgically absent a large amount of stool is seen throughout the colon. No evidence of bowel dilatation. Noninflamed diverticula are seen  throughout the large bowel. Vascular/Lymphatic: Aortic atherosclerosis. No enlarged abdominal or pelvic lymph nodes. Reproductive: Status post hysterectomy. No adnexal masses. Other: No abdominal wall hernia or abnormality. No abdominopelvic ascites. Musculoskeletal: There is grade 2 anterolisthesis of the L5 vertebral body on S1. IMPRESSION: 1. Marked severity emphysematous lung disease. 2. 7.2 cm x 4.0 cm x 8.3 cm left upper lobe lung mass consistent with a primary lung malignancy. 3. 2.7 cm x 1.6 cm x 2.0 cm left lower lobe lung mass consistent with a second neoplastic lesion. 4. Small left pleural effusion. 5. Colonic diverticulosis. 6. Grade 2 anterolisthesis of the L5 vertebral body on S1. Aortic Atherosclerosis (ICD10-I70.0) and Emphysema (ICD10-J43.9). Electronically Signed   By: Virgina Norfolk M.D.   On: 01/05/2022 20:48    EKG: Independently reviewed.see above  Assessment/Plan   Severe Symptomatic Anemia  -hx of IDA followed by oncology due to chronic gi loss -hx of gastric AVM -patient denies black stools or blood in stools  -stool in ED brown  - RBC scan  -gi consult for further assistance  - sandostatin x 1 , ppi iv bid  -Fecal occult  -s/p PRBC x1 in ED   left upper and lower  lobe lung mass consistent with a primary lung malignancy -note associated hemoptysis  -pulmonary consult , oncology consult  -supportive care  -unclear if hemoptysis also contributing to anemia  -CTA no vascular abnormality noted    Leukocytosis  -reactive , due to anemia/ presumed lung ca  -no focus of infection  -patient w/o fever  -continue to monitor  -check inflammatory markers   Asthma/COPD  -on chronic home O2 3L,  -resume home regimen   Hx of DVT -s/p tx not on AC    Chronic gi loss related to Gastric AVM -gi consult  -brown stools  -fecal occult  -serial h/h  -ppi  HTN -hold bp meds for now , can resume in am if patient remain stable    HLD -continue statin as able    DVT prophylaxis: scd Code Status: FULL Family Communication:  Disposition Plan: patient  expected to be admitted greater than 2 midnights  Consults called: Letitia Libra MD  pulmonary /oncology [facility] Admission status: progressive   Clance Boll MD [facility]   If 7PM-7AM, please contact night-coverage [email or web address] Password [email or web address]  01/02/2022, 10:16 PM

## 2021-12-18 ENCOUNTER — Other Ambulatory Visit: Payer: Self-pay | Admitting: Internal Medicine

## 2021-12-18 DIAGNOSIS — D649 Anemia, unspecified: Secondary | ICD-10-CM

## 2021-12-18 DIAGNOSIS — R918 Other nonspecific abnormal finding of lung field: Secondary | ICD-10-CM

## 2021-12-18 LAB — CBC WITH DIFFERENTIAL/PLATELET
Abs Immature Granulocytes: 0.5 10*3/uL — ABNORMAL HIGH (ref 0.00–0.07)
Basophils Absolute: 0 10*3/uL (ref 0.0–0.1)
Basophils Relative: 0 %
Eosinophils Absolute: 0 10*3/uL (ref 0.0–0.5)
Eosinophils Relative: 0 %
HCT: 31.1 % — ABNORMAL LOW (ref 36.0–46.0)
Hemoglobin: 9.1 g/dL — ABNORMAL LOW (ref 12.0–15.0)
Immature Granulocytes: 4 %
Lymphocytes Relative: 6 %
Lymphs Abs: 0.8 10*3/uL (ref 0.7–4.0)
MCH: 23.2 pg — ABNORMAL LOW (ref 26.0–34.0)
MCHC: 29.3 g/dL — ABNORMAL LOW (ref 30.0–36.0)
MCV: 79.3 fL — ABNORMAL LOW (ref 80.0–100.0)
Monocytes Absolute: 2.6 10*3/uL — ABNORMAL HIGH (ref 0.1–1.0)
Monocytes Relative: 18 %
Neutro Abs: 10.3 10*3/uL — ABNORMAL HIGH (ref 1.7–7.7)
Neutrophils Relative %: 72 %
Platelets: 244 10*3/uL (ref 150–400)
RBC: 3.92 MIL/uL (ref 3.87–5.11)
RDW: 19.1 % — ABNORMAL HIGH (ref 11.5–15.5)
WBC: 14.3 10*3/uL — ABNORMAL HIGH (ref 4.0–10.5)
nRBC: 0 % (ref 0.0–0.2)

## 2021-12-18 LAB — COMPREHENSIVE METABOLIC PANEL
ALT: 13 U/L (ref 0–44)
AST: 23 U/L (ref 15–41)
Albumin: 3.3 g/dL — ABNORMAL LOW (ref 3.5–5.0)
Alkaline Phosphatase: 60 U/L (ref 38–126)
Anion gap: 6 (ref 5–15)
BUN: 23 mg/dL (ref 8–23)
CO2: 35 mmol/L — ABNORMAL HIGH (ref 22–32)
Calcium: 8 mg/dL — ABNORMAL LOW (ref 8.9–10.3)
Chloride: 103 mmol/L (ref 98–111)
Creatinine, Ser: 0.97 mg/dL (ref 0.44–1.00)
GFR, Estimated: 56 mL/min — ABNORMAL LOW (ref >60)
Glucose, Bld: 95 mg/dL (ref 70–99)
Potassium: 4.1 mmol/L (ref 3.5–5.1)
Sodium: 144 mmol/L (ref 135–145)
Total Bilirubin: 0.2 mg/dL — ABNORMAL LOW (ref 0.3–1.2)
Total Protein: 6.7 g/dL (ref 6.5–8.1)

## 2021-12-18 LAB — TYPE AND SCREEN
ABO/RH(D): O POS
Antibody Screen: NEGATIVE
Unit division: 0
Unit division: 0

## 2021-12-18 LAB — PROCALCITONIN: Procalcitonin: <0.1 ng/mL

## 2021-12-18 LAB — BPAM RBC
Blood Product Expiration Date: 202307102359
Blood Product Expiration Date: 202307102359
ISSUE DATE / TIME: 202306061721
ISSUE DATE / TIME: 202306062056
Unit Type and Rh: 5100
Unit Type and Rh: 5100

## 2021-12-18 LAB — C-REACTIVE PROTEIN: CRP: 3.2 mg/dL — ABNORMAL HIGH (ref <1.0)

## 2021-12-18 LAB — PROTIME-INR
INR: 1.1 (ref 0.8–1.2)
Prothrombin Time: 14 seconds (ref 11.4–15.2)

## 2021-12-18 MED ORDER — PANTOPRAZOLE SODIUM 40 MG IV SOLR
40.0000 mg | Freq: Two times a day (BID) | INTRAVENOUS | Status: DC
  Administered 2021-12-18 (×2): 40 mg via INTRAVENOUS
  Filled 2021-12-18 (×2): qty 10

## 2021-12-18 NOTE — Discharge Summary (Signed)
Physician Discharge Summary   KEYANNA SANDEFER  female DOB: 1930/10/30  GGY:[id number]  PCP: Rusty Aus, MD  Admit date: 12/14/2021 Discharge date: 12/18/2021  Admitted From: home Disposition:  home Son updated at bedside prior to discharge. CODE STATUS: Full code   Hospital Course:  For full details, please see H&P, progress notes, consult notes and ancillary notes.  Briefly,  NGOZI ALVIDREZ is a 88 y.o. female with medical history significant of  Asthma/COPD on chronic home O2 3L, hx of DVT, IDA thought to be due to chronic gi loss related to Gastric AVM, HTN, who presents to ED due to abn lab noting hgb of 5.3 down from 10 around 2 weeks ago.  In addition to complaints of hemoptysis x 2 weeks as well as sob.  Acute on chronic anemia hx of IDA  followed by oncology, presumed due to chronic gi loss with hx of gastric AVM -presently, patient denies black stools or blood in stools.  Given finding of lung masses and hemoptysis, pt may have blood loss from lungs too. -s/p PRBC x1 in ED with Hgb increase to 9.1 --cont iron supplement  left upper and lower lobe lung masses consistent with a primary lung malignancy -note associated hemoptysis  -oncology consulted with Dr. Rogue Bussing who will continue management as outpatient --pt and family declined biopsy   Leukocytosis  -reactive, due to anemia/ presumed lung ca  -no focus of infection  -patient w/o fever   Severe COPD  Chronic hypoxic respiratory failure on 3L O2 --O2 requirement remains stable at 3L --cont home Advair   Hx of DVT -s/p tx not on AC currently   HTN --cont home HCTZ   Medications under "STOP" list are ones pt was not taking PTA.   Discharge Diagnoses:  Principal Problem:   Severe anemia   30 Day Unplanned Readmission Risk Score    Flowsheet Row ED from 12/16/2021 in [facility]  30 Day Unplanned Readmission Risk Score (%) 10.58 Filed at 12/18/2021 0801        This score is the patient's risk of an unplanned readmission within 30 days of being discharged (0 -100%). The score is based on dignosis, age, lab data, medications, orders, and past utilization.   Low:  0-14.9   Medium: 15-21.9   High: 22-29.9   Extreme: 30 and above         Discharge Instructions:  Allergies as of 12/18/2021       Reactions   Codeine Nausea And Vomiting   Penicillins Rash   Sulfa Antibiotics Rash        Medication List     STOP taking these medications    ALPRAZolam 0.5 MG tablet Commonly known as: XANAX   EPINEPHrine 0.3 mg/0.3 mL Soaj injection Commonly known as: EPI-PEN   HYDROcodone-acetaminophen 5-325 MG tablet Commonly known as: NORCO/VICODIN   hydrocortisone 2.5 % rectal cream Commonly known as: ANUSOL-HC   ipratropium-albuterol 0.5-2.5 (3) MG/3ML Soln Commonly known as: DUONEB   NON FORMULARY   predniSONE 20 MG tablet Commonly known as: DELTASONE   traMADol 50 MG tablet Commonly known as: ULTRAM       TAKE these medications    albuterol 108 (90 Base) MCG/ACT inhaler Commonly known as: VENTOLIN HFA Inhale 1-2 puffs into the lungs every 6 (six) hours as needed for wheezing or shortness of breath.   Bitoin Plus/Calcium/Vit D3 Tabs Take 1 tablet by mouth daily.   calcipotriene 0.005 % cream  Commonly known as: DOVONOX Apply twice a day on Mondays, Wednesdays, and Fridays.   Cholecalciferol 50 MCG (2000 UT) Tabs Take 2,000 Units by mouth daily.   docusate sodium 50 MG capsule Commonly known as: COLACE Take 50 mg by mouth daily.   donepezil 5 MG tablet Commonly known as: ARICEPT Take 1 tablet by mouth at bedtime.   esomeprazole 20 MG capsule Commonly known as: NEXIUM Take 20 mg by mouth daily at 12 noon.   ferrous sulfate 325 (65 FE) MG tablet Take 325 mg by mouth daily.   fluticasone-salmeterol 250-50 MCG/ACT Aepb Commonly known as: ADVAIR Inhale 1 puff into the lungs in the morning and at bedtime.    hydrochlorothiazide 25 MG tablet Commonly known as: HYDRODIURIL Take 25 mg by mouth daily.   loratadine 10 MG tablet Commonly known as: CLARITIN Take 10 mg by mouth daily.   Otezla 30 MG Tabs Generic drug: Apremilast Take 1 tablet (30 mg total) by mouth 2 (two) times daily.   sertraline 50 MG tablet Commonly known as: ZOLOFT Take 75 mg by mouth daily.   triamcinolone ointment 0.1 % Commonly known as: KENALOG Apply to aa's psoriasis BID 5 days per week. Avoid face, groin, and underarms.         Follow-up Information     Cammie Sickle, MD Follow up in 1 week(s).   Specialties: Internal Medicine, Oncology Contact information: [place] [address] [phone]                 Allergies  Allergen Reactions   Codeine Nausea And Vomiting   Penicillins Rash   Sulfa Antibiotics Rash     The results of significant diagnostics from this hospitalization (including imaging, microbiology, ancillary and laboratory) are listed below for reference.   Consultations:   Procedures/Studies: DG Chest 2 View  Result Date: 01/10/2022 CLINICAL DATA:  Shortness of breath. EXAM: CHEST - 2 VIEW COMPARISON:  Chest radiograph 07/11/2019. FINDINGS: Possible left perihilar nodule/mass. Small left pleural effusion. No visible pneumothorax. Emphysema. Similar cardiomediastinal silhouette. Exaggerated thoracic kyphosis with chronic compression fractures, similar. Osteopenia. IMPRESSION: 1. Possible left perihilar nodule/mass. Recommend CT chest (preferably with contrast) to further evaluate for malignancy. 2. Small left pleural effusion. 3. Emphysema. Electronically Signed   By: Margaretha Sheffield M.D.   On: 12/21/2021 15:28   CT CHEST ABDOMEN PELVIS W CONTRAST  Result Date: 01/02/2022 CLINICAL DATA:  Hemoptysis and shortness of breath x2 weeks. EXAM: CT CHEST, ABDOMEN, AND PELVIS WITH CONTRAST TECHNIQUE: Multidetector CT imaging of the chest, abdomen and pelvis  was performed following the standard protocol during bolus administration of intravenous contrast. RADIATION DOSE REDUCTION: This exam was performed according to the departmental dose-optimization program which includes automated exposure control, adjustment of the mA and/or kV according to patient size and/or use of iterative reconstruction technique. CONTRAST:  67mL OMNIPAQUE IOHEXOL 300 MG/ML  SOLN COMPARISON:  July 13, 2019 FINDINGS: CT CHEST FINDINGS Cardiovascular: There is marked severity calcification of the thoracic aorta, without evidence of aortic aneurysm. Normal heart size. No pericardial effusion. Mediastinum/Nodes: No enlarged mediastinal, hilar, or axillary lymph nodes. Thyroid gland, trachea, and esophagus demonstrate no significant findings. Lungs/Pleura: There is marked severity emphysematous lung disease. Mild areas of linear scarring and/or atelectasis are seen within the anteromedial and posteromedial aspect of the right lung base and posterolateral aspect of the left lung base. A 7.2 cm x 4.0 cm x 8.3 cm heterogeneous, complex mass is seen within the anteromedial aspect of  the left upper lobe. A 2.7 cm x 1.6 cm x 2.0 cm heterogeneous low-attenuation lung mass is also seen within the posterior aspect of the left lower lobe. There is a small left pleural effusion. No pneumothorax is identified. Musculoskeletal: Marked severity kyphosis of the mid and lower thoracic spine is seen. CT ABDOMEN PELVIS FINDINGS Hepatobiliary: No focal liver abnormality is seen. Status post cholecystectomy. No biliary dilatation. Pancreas: Unremarkable. No pancreatic ductal dilatation or surrounding inflammatory changes. Spleen: Normal in size without focal abnormality. Adrenals/Urinary Tract: Adrenal glands are unremarkable. Kidneys are normal, without renal calculi, focal lesion, or hydronephrosis. Bladder is unremarkable. Stomach/Bowel: Stomach is within normal limits. The appendix is surgically absent a large  amount of stool is seen throughout the colon. No evidence of bowel dilatation. Noninflamed diverticula are seen throughout the large bowel. Vascular/Lymphatic: Aortic atherosclerosis. No enlarged abdominal or pelvic lymph nodes. Reproductive: Status post hysterectomy. No adnexal masses. Other: No abdominal wall hernia or abnormality. No abdominopelvic ascites. Musculoskeletal: There is grade 2 anterolisthesis of the L5 vertebral body on S1. IMPRESSION: 1. Marked severity emphysematous lung disease. 2. 7.2 cm x 4.0 cm x 8.3 cm left upper lobe lung mass consistent with a primary lung malignancy. 3. 2.7 cm x 1.6 cm x 2.0 cm left lower lobe lung mass consistent with a second neoplastic lesion. 4. Small left pleural effusion. 5. Colonic diverticulosis. 6. Grade 2 anterolisthesis of the L5 vertebral body on S1. Aortic Atherosclerosis (ICD10-I70.0) and Emphysema (ICD10-J43.9). Electronically Signed   By: Virgina Norfolk M.D.   On: 12/20/2021 20:48      Labs: BNP (last 3 results) No results for input(s): BNP in the last 8760 hours. Basic Metabolic Panel: Recent Labs  Lab 01/07/2022 1502 12/18/21 0426  NA 143 144  K 4.0 4.1  CL 101 103  CO2 33* 35*  GLUCOSE 146* 95  BUN 24* 23  CREATININE 1.15* 0.97  CALCIUM 8.5* 8.0*   Liver Function Tests: Recent Labs  Lab 12/18/21 0426  AST 23  ALT 13  ALKPHOS 60  BILITOT 0.2*  PROT 6.7  ALBUMIN 3.3*   No results for input(s): LIPASE, AMYLASE in the last 168 hours. No results for input(s): AMMONIA in the last 168 hours. CBC: Recent Labs  Lab 01/03/2022 1502 12/18/21 0426  WBC 15.6* 14.3*  NEUTROABS  --  10.3*  HGB 5.8* 9.1*  HCT 21.6* 31.1*  MCV 74.0* 79.3*  PLT 319 244   Cardiac Enzymes: No results for input(s): CKTOTAL, CKMB, CKMBINDEX, TROPONINI in the last 168 hours. BNP: Invalid input(s): POCBNP CBG: No results for input(s): GLUCAP in the last 168 hours. D-Dimer No results for input(s): DDIMER in the last 72 hours. Hgb A1c No  results for input(s): HGBA1C in the last 72 hours. Lipid Profile No results for input(s): CHOL, HDL, LDLCALC, TRIG, CHOLHDL, LDLDIRECT in the last 72 hours. Thyroid function studies No results for input(s): TSH, T4TOTAL, T3FREE, THYROIDAB in the last 72 hours.  Invalid input(s): FREET3 Anemia work up No results for input(s): VITAMINB12, FOLATE, FERRITIN, TIBC, IRON, RETICCTPCT in the last 72 hours. Urinalysis    Component Value Date/Time   COLORURINE YELLOW (A) 07/12/2019 0159   APPEARANCEUR CLOUDY (A) 07/12/2019 0159   LABSPEC 1.023 07/12/2019 0159   PHURINE 5.0 07/12/2019 0159   GLUCOSEU NEGATIVE 07/12/2019 0159   HGBUR NEGATIVE 07/12/2019 0159   BILIRUBINUR NEGATIVE 07/12/2019 0159   KETONESUR NEGATIVE 07/12/2019 0159   PROTEINUR 100 (A) 07/12/2019 0159   NITRITE NEGATIVE 07/12/2019 0159  LEUKOCYTESUR SMALL (A) 07/12/2019 0159   Sepsis Labs Invalid input(s): PROCALCITONIN,  WBC,  LACTICIDVEN Microbiology No results found for this or any previous visit (from the past 240 hour(s)).   Total time spend on discharging this patient, including the last patient exam, discussing the hospital stay, instructions for ongoing care as it relates to all pertinent caregivers, as well as preparing the medical discharge records, prescriptions, and/or referrals as applicable, is 60 minutes.    Enzo Bi, MD  [facility] 12/18/2021, 9:27 AM

## 2021-12-18 NOTE — Care Management CC44 (Signed)
Condition Code 44 Documentation Completed  Patient Details  Name: Margaret [name] MRN: [id number] Date of Birth: [birth year]   Condition Code 44 given:  Yes Patient signature on Condition Code 44 notice:  Yes Documentation of 2 MD's agreement:  Yes Code 44 added to claim:  Yes    Shelbie Hutching, RN 12/18/2021, 9:48 AM

## 2021-12-18 NOTE — Care Management Obs Status (Signed)
[facility] NOTIFICATION   Patient Details  Name: Margaret [name] MRN: [id number] Date of Birth: 06/08/1931   Medicare Observation Status Notification Given:  Yes    Shelbie Hutching, RN 12/18/2021, 9:48 AM

## 2021-12-18 NOTE — Consult Note (Signed)
[facility] NOTE  Patient Care Team: Rusty Aus, MD as PCP - General (Internal Medicine)  CHIEF COMPLAINTS/PURPOSE OF CONSULTATION: Lung mass  HISTORY OF PRESENTING ILLNESS: Sitting in the chair.  Accompanied by her son. Margaret [name] 88 y.o.  femalewith history of multimedical problems including-smoking, chronic respiratory failure/COPD on home O2; history of chronic iron deficiency anemia unclear etiology question chronic GI bleed is currently admitted to hospital for generalized weakness progressive shortness of breath on exertion.  On evaluation emergency room patient noted to have hemoglobin of 5.8.  However patient denies any blood in stools or black-colored stool.  Of note patient has been noncompliant with her iron infusions/follow-up at the cancer center.  On further work-up including a CT scan Chest noted to have: Left upper lobe 7 x 8 cm and left lower lobe mass 2 to 3 cm highly concerning for malignancy.  Oncology/pulmonary has been consulted for further evaluation and recommendations.  In the interim patient underwent PRBC transfusion.  Patient feels symptomatically better posttransfusion.  Review of Systems  Constitutional:  Positive for malaise/fatigue and weight loss. Negative for chills, diaphoresis and fever.  HENT:  Negative for nosebleeds and sore throat.   Eyes:  Negative for double vision.  Respiratory:  Positive for cough and shortness of breath. Negative for hemoptysis, sputum production and wheezing.   Cardiovascular:  Negative for chest pain, palpitations, orthopnea and leg swelling.  Gastrointestinal:  Negative for abdominal pain, blood in stool, constipation, diarrhea, heartburn, melena, nausea and vomiting.  Genitourinary:  Negative for dysuria, frequency and urgency.  Musculoskeletal:  Positive for back pain and joint pain.  Skin: Negative.  Negative for itching and rash.  Neurological:  Positive for weakness. Negative for  dizziness, tingling, focal weakness and headaches.  Endo/Heme/Allergies:  Does not bruise/bleed easily.  Psychiatric/Behavioral:  Negative for depression. The patient is not nervous/anxious and does not have insomnia.     MEDICAL HISTORY:  Past Medical History:  Diagnosis Date   Asthma without status asthmaticus    COPD, severe (HCC)    Disc disease, degenerative, lumbar or lumbosacral    DVT (deep venous thrombosis) ([facility]) 2005   Fibrocystic breast disease    Gastric antral vascular ectasia    History of uterine cancer    HTN (hypertension)    Hyperlipidemia    Osteoporosis     SURGICAL HISTORY: Past Surgical History:  Procedure Laterality Date   APPENDECTOMY     CHOLECYSTECTOMY      SOCIAL HISTORY: Social History   Socioeconomic History   Marital status: Married    Spouse name: Not on file   Number of children: Not on file   Years of education: Not on file   Highest education level: Not on file  Occupational History   Not on file  Tobacco Use   Smoking status: Former    Years: 25.00    Types: Cigarettes    Quit date: 12/05/1988    Years since quitting: 33.0   Smokeless tobacco: Never  Substance and Sexual Activity   Alcohol use: Not on file   Drug use: Not on file   Sexual activity: Not on file  Other Topics Concern   Not on file  Social History Narrative   Not on file   Social Determinants of Health   Financial Resource Strain: Not on file  Food Insecurity: Not on file  Transportation Needs: Not on file  Physical Activity: Not on file  Stress: Not on file  Social Connections: Not on file  Intimate Partner Violence: Not on file    FAMILY HISTORY: Family History  Problem Relation Age of Onset   Lung cancer Father     ALLERGIES:  is allergic to codeine, penicillins, and sulfa antibiotics.  MEDICATIONS:  No current facility-administered medications for this encounter.   Current Outpatient Medications  Medication Sig Dispense Refill   albuterol  (VENTOLIN HFA) 108 (90 Base) MCG/ACT inhaler Inhale 1-2 puffs into the lungs every 6 (six) hours as needed for wheezing or shortness of breath.      Apremilast (OTEZLA) 30 MG TABS Take 1 tablet (30 mg total) by mouth 2 (two) times daily. 60 tablet 4   calcipotriene (DOVONOX) 0.005 % cream Apply twice a day on Mondays, Wednesdays, and Fridays. 60 g 2   Cholecalciferol 50 MCG (2000 UT) TABS Take 2,000 Units by mouth daily.      docusate sodium (COLACE) 50 MG capsule Take 50 mg by mouth daily.     donepezil (ARICEPT) 5 MG tablet Take 1 tablet by mouth at bedtime.     esomeprazole (NEXIUM) 20 MG capsule Take 20 mg by mouth daily at 12 noon.      fluticasone-salmeterol (ADVAIR) 250-50 MCG/ACT AEPB Inhale 1 puff into the lungs in the morning and at bedtime.     hydrochlorothiazide (HYDRODIURIL) 25 MG tablet Take 25 mg by mouth daily.     loratadine (CLARITIN) 10 MG tablet Take 10 mg by mouth daily.     Multiple Vitamins-Minerals (BITOIN PLUS/CALCIUM/VIT D3) TABS Take 1 tablet by mouth daily.     sertraline (ZOLOFT) 50 MG tablet Take 75 mg by mouth daily.     triamcinolone ointment (KENALOG) 0.1 % Apply to aa's psoriasis BID 5 days per week. Avoid face, groin, and underarms. 80 g 1   ferrous sulfate 325 (65 FE) MG tablet Take 325 mg by mouth daily. (Patient not taking: Reported on 12/25/2021)       PHYSICAL EXAMINATION:  Vitals:   12/18/21 0800 12/18/21 0959  BP: (!) 165/82 (!) 148/74  Pulse: 95 (!) 104  Resp: 18 16  Temp:    SpO2: 92% 93%   Filed Weights   01/07/2022 1450  Weight: 99 lb 3.3 oz (45 kg)   Frail-appearing Caucasian female patient.  On 3 L of oxygen accompanied by her son. Physical Exam Vitals and nursing note reviewed.  HENT:     Head: Normocephalic and atraumatic.     Mouth/Throat:     Pharynx: Oropharynx is clear.  Eyes:     Extraocular Movements: Extraocular movements intact.     Pupils: Pupils are equal, round, and reactive to light.  Cardiovascular:     Rate and  Rhythm: Normal rate and regular rhythm.  Pulmonary:     Comments: Decreased breath sounds bilaterally.  Abdominal:     Palpations: Abdomen is soft.  Musculoskeletal:        General: Normal range of motion.     Cervical back: Normal range of motion.  Skin:    General: Skin is warm.  Neurological:     General: No focal deficit present.     Mental Status: She is alert and oriented to person, place, and time.  Psychiatric:        Behavior: Behavior normal.        Judgment: Judgment normal.     LABORATORY DATA:  I have reviewed the data as listed Lab Results  Component Value Date   WBC 14.3 (H)  12/18/2021   HGB 9.1 (L) 12/18/2021   HCT 31.1 (L) 12/18/2021   MCV 79.3 (L) 12/18/2021   PLT 244 12/18/2021   Recent Labs    12/25/2021 1502 12/18/21 0426  NA 143 144  K 4.0 4.1  CL 101 103  CO2 33* 35*  GLUCOSE 146* 95  BUN 24* 23  CREATININE 1.15* 0.97  CALCIUM 8.5* 8.0*  GFRNONAA 46* 56*  PROT  --  6.7  ALBUMIN  --  3.3*  AST  --  23  ALT  --  13  ALKPHOS  --  60  BILITOT  --  0.2*    RADIOGRAPHIC STUDIES: I have personally reviewed the radiological images as listed and agreed with the findings in the report. DG Chest 2 View  Result Date: 12/19/2021 CLINICAL DATA:  Shortness of breath. EXAM: CHEST - 2 VIEW COMPARISON:  Chest radiograph 07/11/2019. FINDINGS: Possible left perihilar nodule/mass. Small left pleural effusion. No visible pneumothorax. Emphysema. Similar cardiomediastinal silhouette. Exaggerated thoracic kyphosis with chronic compression fractures, similar. Osteopenia. IMPRESSION: 1. Possible left perihilar nodule/mass. Recommend CT chest (preferably with contrast) to further evaluate for malignancy. 2. Small left pleural effusion. 3. Emphysema. Electronically Signed   By: Margaretha Sheffield M.D.   On: 01/04/2022 15:28   CT CHEST ABDOMEN PELVIS W CONTRAST  Result Date: 12/16/2021 CLINICAL DATA:  Hemoptysis and shortness of breath x2 weeks. EXAM: CT CHEST, ABDOMEN,  AND PELVIS WITH CONTRAST TECHNIQUE: Multidetector CT imaging of the chest, abdomen and pelvis was performed following the standard protocol during bolus administration of intravenous contrast. RADIATION DOSE REDUCTION: This exam was performed according to the departmental dose-optimization program which includes automated exposure control, adjustment of the mA and/or kV according to patient size and/or use of iterative reconstruction technique. CONTRAST:  40mL OMNIPAQUE IOHEXOL 300 MG/ML  SOLN COMPARISON:  July 13, 2019 FINDINGS: CT CHEST FINDINGS Cardiovascular: There is marked severity calcification of the thoracic aorta, without evidence of aortic aneurysm. Normal heart size. No pericardial effusion. Mediastinum/Nodes: No enlarged mediastinal, hilar, or axillary lymph nodes. Thyroid gland, trachea, and esophagus demonstrate no significant findings. Lungs/Pleura: There is marked severity emphysematous lung disease. Mild areas of linear scarring and/or atelectasis are seen within the anteromedial and posteromedial aspect of the right lung base and posterolateral aspect of the left lung base. A 7.2 cm x 4.0 cm x 8.3 cm heterogeneous, complex mass is seen within the anteromedial aspect of the left upper lobe. A 2.7 cm x 1.6 cm x 2.0 cm heterogeneous low-attenuation lung mass is also seen within the posterior aspect of the left lower lobe. There is a small left pleural effusion. No pneumothorax is identified. Musculoskeletal: Marked severity kyphosis of the mid and lower thoracic spine is seen. CT ABDOMEN PELVIS FINDINGS Hepatobiliary: No focal liver abnormality is seen. Status post cholecystectomy. No biliary dilatation. Pancreas: Unremarkable. No pancreatic ductal dilatation or surrounding inflammatory changes. Spleen: Normal in size without focal abnormality. Adrenals/Urinary Tract: Adrenal glands are unremarkable. Kidneys are normal, without renal calculi, focal lesion, or hydronephrosis. Bladder is  unremarkable. Stomach/Bowel: Stomach is within normal limits. The appendix is surgically absent a large amount of stool is seen throughout the colon. No evidence of bowel dilatation. Noninflamed diverticula are seen throughout the large bowel. Vascular/Lymphatic: Aortic atherosclerosis. No enlarged abdominal or pelvic lymph nodes. Reproductive: Status post hysterectomy. No adnexal masses. Other: No abdominal wall hernia or abnormality. No abdominopelvic ascites. Musculoskeletal: There is grade 2 anterolisthesis of the L5 vertebral body on S1. IMPRESSION: 1. Marked severity  emphysematous lung disease. 2. 7.2 cm x 4.0 cm x 8.3 cm left upper lobe lung mass consistent with a primary lung malignancy. 3. 2.7 cm x 1.6 cm x 2.0 cm left lower lobe lung mass consistent with a second neoplastic lesion. 4. Small left pleural effusion. 5. Colonic diverticulosis. 6. Grade 2 anterolisthesis of the L5 vertebral body on S1. Aortic Atherosclerosis (ICD10-I70.0) and Emphysema (ICD10-J43.9). Electronically Signed   By: Virgina Norfolk M.D.   On: 01/10/2022 20:48    No problem-specific Assessment & Plan notes found for this encounter.  # 88 y.o.  femalewith multiple medical problems including smoking/COPD**currently admitted to hospital for  # CT scan Chest noted to have: Left upper lobe 7 x 8 cm and left lower lobe mass 2 to 3 cm highly concerning for malignancy.   #Severe anemia-likely iron deficiency.  Question source of bleeding/blood loss hemoglobin 5.8 s/p PRBC transfusion-hemoglobin today is 9.3.  No evidence of any obvious bleeding.  # COPD/chronic respiratory failure-on 3 L of oxygen.  #Performance status 2-3.   Recommendations/Plan:  #I had a long discussion with the patient/son  regarding the significant concerning findings on imaging highly suggestive of primary lung malignancy.  Discussed that patient will need a PET scan as outpatient for further evaluation/staging. Also discussed the need for biopsy to  confirm malignancy/type of malignancy.    #However given patient's advanced age and in general reluctance with any treatment options-patient/family is not too keen with biopsy option.  However they are interested in PET scan for further evaluation.  Will order PET scan outpatient.  D[facility]  Patient will follow-up with me few days after the results of the PET scan.   #We will plan outpatient IV iron infusions if patient is interested.   Thank you Dr.Lai for allowing me to participate in the care of your pleasant patient. Please do not hesitate to contact me with questions or concerns in the interim. Discussed with Dr.Lai; and Dr.Aleskerov.   # I reviewed the blood work- with the patient in detail; also reviewed the imaging independently [as summarized above]; and with the patient in detail.   All questions were answered. The patient knows to call the clinic with any problems, questions or concerns.    Cammie Sickle, MD 12/18/2021 8:42 PM

## 2021-12-18 NOTE — Progress Notes (Signed)
Hayley-please have the PET scan schedule/next week.  Schedule  pt follow up with me in the week of June 19th- MD; labs- cbc;hold tube; iron studies/ferritin; LDH;venofer.    D-2 possible 1 unit of PRBC.   Thanks GB

## 2021-12-19 ENCOUNTER — Other Ambulatory Visit: Payer: Self-pay

## 2021-12-19 DIAGNOSIS — R918 Other nonspecific abnormal finding of lung field: Secondary | ICD-10-CM

## 2021-12-19 DIAGNOSIS — D500 Iron deficiency anemia secondary to blood loss (chronic): Secondary | ICD-10-CM

## 2021-12-19 NOTE — Progress Notes (Signed)
Labs entered.

## 2021-12-23 ENCOUNTER — Inpatient Hospital Stay
Admission: EM | Admit: 2021-12-23 | Discharge: 2022-01-11 | DRG: 951 | Disposition: E | Payer: Medicare Other | Attending: Internal Medicine | Admitting: Internal Medicine

## 2021-12-23 ENCOUNTER — Emergency Department: Payer: Medicare Other

## 2021-12-23 ENCOUNTER — Other Ambulatory Visit: Payer: Self-pay

## 2021-12-23 ENCOUNTER — Telehealth: Payer: Self-pay | Admitting: *Deleted

## 2021-12-23 DIAGNOSIS — Z885 Allergy status to narcotic agent status: Secondary | ICD-10-CM

## 2021-12-23 DIAGNOSIS — Z66 Do not resuscitate: Secondary | ICD-10-CM | POA: Diagnosis present

## 2021-12-23 DIAGNOSIS — C3412 Malignant neoplasm of upper lobe, left bronchus or lung: Secondary | ICD-10-CM | POA: Diagnosis present

## 2021-12-23 DIAGNOSIS — J9601 Acute respiratory failure with hypoxia: Principal | ICD-10-CM

## 2021-12-23 DIAGNOSIS — Z86718 Personal history of other venous thrombosis and embolism: Secondary | ICD-10-CM

## 2021-12-23 DIAGNOSIS — J449 Chronic obstructive pulmonary disease, unspecified: Secondary | ICD-10-CM | POA: Diagnosis present

## 2021-12-23 DIAGNOSIS — D509 Iron deficiency anemia, unspecified: Secondary | ICD-10-CM | POA: Diagnosis present

## 2021-12-23 DIAGNOSIS — Z801 Family history of malignant neoplasm of trachea, bronchus and lung: Secondary | ICD-10-CM | POA: Diagnosis not present

## 2021-12-23 DIAGNOSIS — R778 Other specified abnormalities of plasma proteins: Secondary | ICD-10-CM | POA: Diagnosis present

## 2021-12-23 DIAGNOSIS — E785 Hyperlipidemia, unspecified: Secondary | ICD-10-CM | POA: Diagnosis present

## 2021-12-23 DIAGNOSIS — J811 Chronic pulmonary edema: Secondary | ICD-10-CM | POA: Diagnosis present

## 2021-12-23 DIAGNOSIS — Z882 Allergy status to sulfonamides status: Secondary | ICD-10-CM | POA: Diagnosis not present

## 2021-12-23 DIAGNOSIS — Z9981 Dependence on supplemental oxygen: Secondary | ICD-10-CM | POA: Diagnosis not present

## 2021-12-23 DIAGNOSIS — J9621 Acute and chronic respiratory failure with hypoxia: Secondary | ICD-10-CM | POA: Diagnosis present

## 2021-12-23 DIAGNOSIS — Z880 Allergy status to penicillin: Secondary | ICD-10-CM | POA: Diagnosis not present

## 2021-12-23 DIAGNOSIS — Z79899 Other long term (current) drug therapy: Secondary | ICD-10-CM

## 2021-12-23 DIAGNOSIS — I10 Essential (primary) hypertension: Secondary | ICD-10-CM | POA: Diagnosis present

## 2021-12-23 DIAGNOSIS — Z515 Encounter for palliative care: Secondary | ICD-10-CM | POA: Diagnosis present

## 2021-12-23 DIAGNOSIS — Z87891 Personal history of nicotine dependence: Secondary | ICD-10-CM | POA: Diagnosis not present

## 2021-12-23 DIAGNOSIS — M810 Age-related osteoporosis without current pathological fracture: Secondary | ICD-10-CM | POA: Diagnosis present

## 2021-12-23 DIAGNOSIS — Z8542 Personal history of malignant neoplasm of other parts of uterus: Secondary | ICD-10-CM

## 2021-12-23 LAB — COMPREHENSIVE METABOLIC PANEL
ALT: 40 U/L (ref 0–44)
AST: 61 U/L — ABNORMAL HIGH (ref 15–41)
Albumin: 3.6 g/dL (ref 3.5–5.0)
Alkaline Phosphatase: 77 U/L (ref 38–126)
Anion gap: 5 (ref 5–15)
BUN: 39 mg/dL — ABNORMAL HIGH (ref 8–23)
CO2: 39 mmol/L — ABNORMAL HIGH (ref 22–32)
Calcium: 9.3 mg/dL (ref 8.9–10.3)
Chloride: 103 mmol/L (ref 98–111)
Creatinine, Ser: 1.28 mg/dL — ABNORMAL HIGH (ref 0.44–1.00)
GFR, Estimated: 40 mL/min — ABNORMAL LOW (ref >60)
Glucose, Bld: 255 mg/dL — ABNORMAL HIGH (ref 70–99)
Potassium: 5.5 mmol/L — ABNORMAL HIGH (ref 3.5–5.1)
Sodium: 147 mmol/L — ABNORMAL HIGH (ref 135–145)
Total Bilirubin: 0.7 mg/dL (ref 0.3–1.2)
Total Protein: 7.7 g/dL (ref 6.5–8.1)

## 2021-12-23 LAB — CBC WITH DIFFERENTIAL/PLATELET
Abs Immature Granulocytes: 3.87 10*3/uL — ABNORMAL HIGH (ref 0.00–0.07)
Basophils Absolute: 0.2 10*3/uL — ABNORMAL HIGH (ref 0.0–0.1)
Basophils Relative: 1 %
Eosinophils Absolute: 0 10*3/uL (ref 0.0–0.5)
Eosinophils Relative: 0 %
HCT: 39.2 % (ref 36.0–46.0)
Hemoglobin: 10.5 g/dL — ABNORMAL LOW (ref 12.0–15.0)
Immature Granulocytes: 16 %
Lymphocytes Relative: 3 %
Lymphs Abs: 0.8 10*3/uL (ref 0.7–4.0)
MCH: 23.5 pg — ABNORMAL LOW (ref 26.0–34.0)
MCHC: 26.8 g/dL — ABNORMAL LOW (ref 30.0–36.0)
MCV: 87.7 fL (ref 80.0–100.0)
Monocytes Absolute: 5.3 10*3/uL — ABNORMAL HIGH (ref 0.1–1.0)
Monocytes Relative: 22 %
Neutro Abs: 14.4 10*3/uL — ABNORMAL HIGH (ref 1.7–7.7)
Neutrophils Relative %: 58 %
Platelets: 242 10*3/uL (ref 150–400)
RBC: 4.47 MIL/uL (ref 3.87–5.11)
RDW: 21.2 % — ABNORMAL HIGH (ref 11.5–15.5)
Smear Review: NORMAL
WBC: 24.5 10*3/uL — ABNORMAL HIGH (ref 4.0–10.5)
nRBC: 0.2 % (ref 0.0–0.2)

## 2021-12-23 LAB — TROPONIN I (HIGH SENSITIVITY): Troponin I (High Sensitivity): 87 ng/L — ABNORMAL HIGH (ref <18)

## 2021-12-23 LAB — CBG MONITORING, ED: Glucose-Capillary: 240 mg/dL — ABNORMAL HIGH (ref 70–99)

## 2021-12-23 LAB — PATHOLOGIST SMEAR REVIEW

## 2021-12-23 LAB — BRAIN NATRIURETIC PEPTIDE: B Natriuretic Peptide: 3170.1 pg/mL — ABNORMAL HIGH (ref 0.0–100.0)

## 2021-12-23 MED ORDER — POLYVINYL ALCOHOL 1.4 % OP SOLN
1.0000 [drp] | Freq: Four times a day (QID) | OPHTHALMIC | Status: DC | PRN
  Filled 2021-12-23: qty 15

## 2021-12-23 MED ORDER — MORPHINE SULFATE (PF) 2 MG/ML IV SOLN
2.0000 mg | INTRAVENOUS | Status: DC | PRN
Start: 2021-12-23 — End: 2021-12-23

## 2021-12-23 MED ORDER — ONDANSETRON HCL 4 MG/2ML IJ SOLN: 4.0000 mg | Freq: Four times a day (QID) | INTRAMUSCULAR | Status: DC | PRN

## 2021-12-23 MED ORDER — MORPHINE SULFATE (PF) 2 MG/ML IV SOLN
2.0000 mg | INTRAVENOUS | Status: DC | PRN
  Administered 2021-12-23: 2 mg via INTRAVENOUS
  Filled 2021-12-23: qty 1

## 2021-12-23 MED ORDER — MORPHINE SULFATE (PF) 2 MG/ML IV SOLN
2.0000 mg | Freq: Once | INTRAVENOUS | Status: AC
  Administered 2021-12-23: 2 mg via INTRAVENOUS
  Filled 2021-12-23: qty 1

## 2021-12-23 MED ORDER — LORAZEPAM 2 MG/ML IJ SOLN
1.0000 mg | Freq: Four times a day (QID) | INTRAMUSCULAR | Status: DC | PRN
Start: 2021-12-23 — End: 2021-12-24
  Administered 2021-12-23 – 2021-12-24 (×2): 1 mg via INTRAVENOUS
  Filled 2021-12-23 (×2): qty 1

## 2021-12-23 MED ORDER — GLYCOPYRROLATE 0.2 MG/ML IJ SOLN
0.2000 mg | Freq: Three times a day (TID) | INTRAMUSCULAR | Status: DC
  Administered 2021-12-23 (×2): 0.2 mg via INTRAVENOUS
  Filled 2021-12-23 (×4): qty 1

## 2021-12-23 MED ORDER — ONDANSETRON 4 MG PO TBDP: 4.0000 mg | ORAL_TABLET | Freq: Four times a day (QID) | ORAL | Status: DC | PRN

## 2021-12-23 NOTE — Progress Notes (Signed)
   12/19/2021 1600  Clinical Encounter Type  Visited With Patient and family together  Visit Type Initial;Spiritual support;Social support;Patient actively dying   Daryel November attended to family gathered at bedside with pt. Checked-in with pt's son, Aaron Edelman, and other family, grandchildren. Chaplain B provided compassionate presence and hospitality (added ice, cups to bereavement cart). Chaplain B facilitated storytelling/memories of what pt loved. Affirmed family's desire that a chaplain be present to honor and support pt's faith.  Chaplain B encouraged sharing and presence; provided additional seating. Made overnight chaplain aware of status. Please page if further needs.

## 2021-12-23 NOTE — ED Triage Notes (Signed)
Pt comes with c/o sob and unresponsive. Pt with agonal respirations. MD McHuge at bedside. Pt placed n BIPAP at this time.  BP_160/77 O2 in 80s. Pt placed on NRB by ems and O2 at 100%. HR-117 CBG-240 RR-33  Pt is DNR and has lung cancer. Hospice in works

## 2021-12-23 NOTE — Progress Notes (Signed)
RT called to room for BiPAP placement. Pt agonal breathing upon arrival and placed on V-60 with Medium mask. BiPAP pressures are 18/10 to obtain Vt ~227ml.

## 2021-12-23 NOTE — ED Notes (Signed)
Pt given morphine with all of family at bedside. RT at bedside to remove Bipap.

## 2021-12-23 NOTE — H&P (Signed)
History and Physical    Margaret [name] ZHG:[id number] DOB: August 12, 1930 DOA: 12/22/2021  PCP: Rusty Aus, MD   Patient coming from: Home    Chief Complaint: Unresponsive  HPI: Margaret [name] is a 88 y.o. female with medical history significant of recently diagnosed left sided lung cancer, iron deficiency anemia, COPD on 2 L of oxygen at home, history of DVT, hypertension who was brought from home by the family after she was found to be unresponsive.  She was recently admitted here on 6/6 and was found to have severe anemia when she presented with hemoptysis, weakness, shortness of breath..  During that admission, she was diagnosed to have left upper and lower lobe lung masses consistent with primary lung malignancy.  Oncology was consulted at the time and she was discharged home with outpatient work-up plan.  Patient lives with her son.  She is usually ambulatory at baseline. She was seen' ok ' to this morning.  She was left by her family for half an hour and when they came back she was found to be unresponsive, gasping for breath.  Patient was immediately brought to the emergency department.  EMS found her hypoxic and put on nonrebreather but she remained minimally responsive.  On presentation, she was having agonal breathing.  Goals of care was discussed by ED physician with the family and they opted for comfort care.  Patient was briefly put on BiPAP which was later removed.  Patient was given morphine.  ED Course: Lab work showed sodium of 147, potassium 5.5, creatinine of 1.28, BNP of 2000's, WBC of 24.5.  Chest x-ray showed features of pulmonary edema.  She was briefly put on BiPAP which was later discontinued after family opted for comfort care.  Palliative care also consulted .patient admitted with the goal of full comfort care.  Review of Systems: As per HPI otherwise 10 point review of systems negative.    Past Medical History:  Diagnosis Date   Asthma without status asthmaticus     COPD, severe (HCC)    Disc disease, degenerative, lumbar or lumbosacral    DVT (deep venous thrombosis) ([place]) 2005   Fibrocystic breast disease    Gastric antral vascular ectasia    History of uterine cancer    HTN (hypertension)    Hyperlipidemia    Osteoporosis     Past Surgical History:  Procedure Laterality Date   APPENDECTOMY     CHOLECYSTECTOMY       reports that she quit smoking about 33 years ago. Her smoking use included cigarettes. She has never used smokeless tobacco. No history on file for alcohol use and drug use.  Allergies  Allergen Reactions   Codeine Nausea And Vomiting   Penicillins Rash   Sulfa Antibiotics Rash    Family History  Problem Relation Age of Onset   Lung cancer Father      Prior to Admission medications   Medication Sig Start Date End Date Taking? Authorizing Provider  albuterol (VENTOLIN HFA) 108 (90 Base) MCG/ACT inhaler Inhale 1-2 puffs into the lungs every 6 (six) hours as needed for wheezing or shortness of breath.  04/18/15   [provider]  Apremilast (OTEZLA) 30 MG TABS Take 1 tablet (30 mg total) by mouth 2 (two) times daily. 08/09/20   Ralene Bathe, MD  calcipotriene (DOVONOX) 0.005 % cream Apply twice a day on Mondays, Wednesdays, and Fridays. 09/13/20   Ralene Bathe, MD  Cholecalciferol 50 MCG (2000 UT) TABS Take  2,000 Units by mouth daily.     [provider]  docusate sodium (COLACE) 50 MG capsule Take 50 mg by mouth daily.    [provider]  donepezil (ARICEPT) 5 MG tablet Take 1 tablet by mouth at bedtime. 11/23/20   [provider]  esomeprazole (NEXIUM) 20 MG capsule Take 20 mg by mouth daily at 12 noon.  04/23/16   [provider]  ferrous sulfate 325 (65 FE) MG tablet Take 325 mg by mouth daily. Patient not taking: Reported on 01/07/2022    [provider]  fluticasone-salmeterol (ADVAIR) 250-50 MCG/ACT AEPB Inhale 1 puff into the lungs in the morning and at  bedtime.    [provider]  hydrochlorothiazide (HYDRODIURIL) 25 MG tablet Take 25 mg by mouth daily.    [provider]  loratadine (CLARITIN) 10 MG tablet Take 10 mg by mouth daily.    [provider]  Multiple Vitamins-Minerals (BITOIN PLUS/CALCIUM/VIT D3) TABS Take 1 tablet by mouth daily.    [provider]  sertraline (ZOLOFT) 50 MG tablet Take 75 mg by mouth daily. 04/18/15   [provider]  triamcinolone ointment (KENALOG) 0.1 % Apply to aa's psoriasis BID 5 days per week. Avoid face, groin, and underarms. 08/09/20   Ralene Bathe, MD    Physical Exam: Vitals:   12/19/2021 1515 01/03/2022 1530 12/12/2021 1630 12/14/2021 1645  BP:  (!) 124/56    Pulse: (!) 102 97 (!) 102 (!) 103  Resp:  (!) 34    SpO2: 100% 100% (!) 84% (!) 78%    Constitutional: Unresponsive,, tachypneic, chronically ill looking, very deconditioned Respiratory: Tachypnea, diminished sounds bilaterally Cardiovascular: Sinus tachycardia.  Abdomen: no tenderness, no masses palpated. No hepatosplenomegaly. Bowel sounds positive.  Musculoskeletal: no clubbing / cyanosis. No joint deformity upper and lower extremities.  Skin: no rashes, lesions, ulcers. No induration Neurologic: Unresponsive  Labs on Admission: I have personally reviewed following labs and imaging studies  CBC: Recent Labs  Lab 12/26/2021 1502 12/18/21 0426 12/14/2021 1327  WBC 15.6* 14.3* 24.5*  NEUTROABS  --  10.3* 14.4*  HGB 5.8* 9.1* 10.5*  HCT 21.6* 31.1* 39.2  MCV 74.0* 79.3* 87.7  PLT 319 244 308   Basic Metabolic Panel: Recent Labs  Lab 01/08/2022 1502 12/18/21 0426 01/01/2022 1327  NA 143 144 147*  K 4.0 4.1 5.5*  CL 101 103 103  CO2 33* 35* 39*  GLUCOSE 146* 95 255*  BUN 24* 23 39*  CREATININE 1.15* 0.97 1.28*  CALCIUM 8.5* 8.0* 9.3   GFR: Estimated Creatinine Clearance: 21.6 mL/min (A) (by C-G formula based on SCr of 1.28 mg/dL (H)). Liver Function Tests: Recent Labs  Lab  12/18/21 0426 12/12/2021 1327  AST 23 61*  ALT 13 40  ALKPHOS 60 77  BILITOT 0.2* 0.7  PROT 6.7 7.7  ALBUMIN 3.3* 3.6   No results for input(s): "LIPASE", "AMYLASE" in the last 168 hours. No results for input(s): "AMMONIA" in the last 168 hours. Coagulation Profile: Recent Labs  Lab 12/18/21 0426  INR 1.1   Cardiac Enzymes: No results for input(s): "CKTOTAL", "CKMB", "CKMBINDEX", "TROPONINI" in the last 168 hours. BNP (last 3 results) No results for input(s): "PROBNP" in the last 8760 hours. HbA1C: No results for input(s): "HGBA1C" in the last 72 hours. CBG: Recent Labs  Lab 12/20/2021 1318  GLUCAP 240*   Lipid Profile: No results for input(s): "CHOL", "HDL", "LDLCALC", "TRIG", "CHOLHDL", "LDLDIRECT" in the last 72 hours. Thyroid Function Tests: No  results for input(s): "TSH", "T4TOTAL", "FREET4", "T3FREE", "THYROIDAB" in the last 72 hours. Anemia Panel: No results for input(s): "VITAMINB12", "FOLATE", "FERRITIN", "TIBC", "IRON", "RETICCTPCT" in the last 72 hours. Urine analysis:    Component Value Date/Time   COLORURINE YELLOW (A) 07/12/2019 0159   APPEARANCEUR CLOUDY (A) 07/12/2019 0159   LABSPEC 1.023 07/12/2019 0159   PHURINE 5.0 07/12/2019 0159   GLUCOSEU NEGATIVE 07/12/2019 0159   HGBUR NEGATIVE 07/12/2019 0159   BILIRUBINUR NEGATIVE 07/12/2019 0159   KETONESUR NEGATIVE 07/12/2019 0159   PROTEINUR 100 (A) 07/12/2019 0159   NITRITE NEGATIVE 07/12/2019 0159   LEUKOCYTESUR SMALL (A) 07/12/2019 0159    Radiological Exams on Admission: DG Chest Portable 1 View  Result Date: 01/02/2022 CLINICAL DATA:  Shortness of breath EXAM: PORTABLE CHEST 1 VIEW COMPARISON:  Previous studies including the examination done on 12/31/2021 FINDINGS: Transverse diameter of heart is increased. Central pulmonary vessels are more prominent. There is increase in small bilateral pleural effusions. There is no pneumothorax. Increased interstitial markings are seen in the parahilar regions  suggesting pulmonary edema. Previously seen nodular density in the left parahilar region is obscured. Patient's chin is partly obscuring the left apical region. IMPRESSION: Cardiomegaly. Central pulmonary vessels are more prominent suggesting CHF. Small bilateral pleural effusions with interval increase. Electronically Signed   By: Elmer Picker M.D.   On: 12/20/2021 13:56     Assessment/Plan Principal Problem:   Comfort measures only status  Goals of care: Found to be unresponsive at home.  Brought by family.  Recently diagnosed with lung cancer.  Family opted for comfort care.  Full comfort care was initiated.  Started on morphine, glycopyrrolate, Ativan.  Palliative  care also consulted by ED physician.    Left lung mass: On last admission, CT chest showed left upper lobe 7 X 8 cm and left lower lobe 2-3 cm masses highly concerning for malignancy.  She was plan for outpatient work-up.  Currently on comfort care.  COPD: On 2 to 3 L of oxygen at home at baseline.  Currently on comfort care  History of severe iron deficiency anemia       Severity of Illness: The appropriate patient status for this patient is INPATIENT.  DVT prophylaxis: None Code Status: Comfort care Family Communication: Family at bedside Consults called: Palliative care     Shelly Coss MD [facility]  01/05/2022, 5:26 PM

## 2021-12-23 NOTE — Progress Notes (Signed)
Pt given Morphine by RN and BiPAP mask removed at this time. Pt placed on 2Lnc

## 2021-12-23 NOTE — ED Notes (Signed)
Full rainbow of blood-work sent to lab. Lactic on ice.

## 2021-12-23 NOTE — Telephone Encounter (Signed)
Pt and son cancelled PET scan scheduled for 6/13. Stating that they no longer wish to have PET scan done. She is scheduled for follow up with Dr. Rogue Bussing on 6/22.

## 2021-12-23 NOTE — ED Notes (Signed)
Provider KRM at bedside.

## 2021-12-23 NOTE — ED Notes (Signed)
Mouth moisturizer applied to pt lip and mouth.

## 2021-12-23 NOTE — ED Notes (Signed)
Pt on BiPAP placed and adjusted by RRT currently at bedside. Provider Duchesne talked with family over phone and they stated pt DNR and will have paperwork completed for DNI. Family states they will arrive to ER soon.

## 2021-12-23 NOTE — ED Notes (Signed)
Pt family provided with condolence tray from dietary.

## 2021-12-23 NOTE — ED Notes (Signed)
Family at bedside; provider Kurten at bedside.

## 2021-12-23 NOTE — ED Provider Notes (Signed)
[facility] Provider Note    Event Date/Time   First MD Initiated Contact with Patient 12/28/2021 1325     (approximate)   History   Unresponsive   HPI  Margaret [name] is a 88 y.o. female       Past medical history of DVT recently diagnosed lung cancer COPD on 2 L nasal cannula baseline presents unresponsive.  Per family patient was in her normal state of health yesterday although recently diagnosed with lung cancer and is been moving towards hospice.  She was minimally responsive with increased work of breathing at home.  Per EMS she was hypoxic placed on nonrebreather but minimally responsive.  Unable to obtain any history from the patient given her mental status. Past Medical History:  Diagnosis Date   Asthma without status asthmaticus    COPD, severe (HCC)    Disc disease, degenerative, lumbar or lumbosacral    DVT (deep venous thrombosis) ([place]) 2005   Fibrocystic breast disease    Gastric antral vascular ectasia    History of uterine cancer    HTN (hypertension)    Hyperlipidemia    Osteoporosis     Patient Active Problem List   Diagnosis Date Noted   Severe anemia 01/05/2022   Oral thrush    Impaired fasting glucose    Lobar pneumonia (HCC)    Anemia    Lactic acidosis    Weight loss    Acute kidney injury superimposed on CKD ([place])    Community acquired pneumonia 07/11/2019   Acute on chronic respiratory failure with hypoxia ([place]) 07/11/2019   COPD with acute exacerbation ([place]) 07/11/2019   Suspected COVID-19 virus infection 07/11/2019   CAP (community acquired pneumonia) 07/11/2019   Asthma without status asthmaticus 06/01/2015   Bloodgood disease 06/01/2015   Personal history of malignant neoplasm of other parts of uterus 06/01/2015   Iron deficiency anemia due to chronic blood loss 01/02/2015   Degeneration of intervertebral disc of lumbosacral region 04/13/2014   Severe chronic obstructive pulmonary disease ([place]) 11/11/2013   Deep  vein thrombosis (DVT) ([place]) 11/11/2013   Gastric antral vascular ectasia 11/11/2013   BP (high blood pressure) 11/11/2013   HLD (hyperlipidemia) 11/11/2013   OP (osteoporosis) 11/11/2013     Physical Exam  Triage Vital Signs: ED Triage Vitals  Enc Vitals Group     BP 01/07/2022 1320 (!) 159/87     Pulse Rate 12/20/2021 1319 (!) 106     Resp 12/21/2021 1321 (!) 28     Temp --      Temp src --      SpO2 12/14/2021 1319 100 %     Weight --      Height --      Head Circumference --      Peak Flow --      Pain Score --      Pain Loc --      Pain Edu? --      Excl. in [place]? --     Most recent vital signs: Vitals:   01/07/2022 1415 01/06/2022 1423  BP:    Pulse: 98 (!) 103  Resp:    SpO2: 100% 98%     General: Appears critically ill minimally responsive CV:  Cold extremities 2+ edema Resp:  Agonal respirations Abd:  No distention.  Neuro:             GCS is 3   ED Results / Procedures / Treatments  Labs (all labs  ordered are listed, but only abnormal results are displayed) Labs Reviewed  COMPREHENSIVE METABOLIC PANEL - Abnormal; Notable for the following components:      Result Value   Sodium 147 (*)    Potassium 5.5 (*)    CO2 39 (*)    Glucose, Bld 255 (*)    BUN 39 (*)    Creatinine, Ser 1.28 (*)    AST 61 (*)    GFR, Estimated 40 (*)    All other components within normal limits  CBC WITH DIFFERENTIAL/PLATELET - Abnormal; Notable for the following components:   WBC 24.5 (*)    Hemoglobin 10.5 (*)    MCH 23.5 (*)    MCHC 26.8 (*)    RDW 21.2 (*)    Neutro Abs 14.4 (*)    Monocytes Absolute 5.3 (*)    Basophils Absolute 0.2 (*)    Abs Immature Granulocytes 3.87 (*)    All other components within normal limits  CBG MONITORING, ED - Abnormal; Notable for the following components:   Glucose-Capillary 240 (*)    All other components within normal limits  TROPONIN I (HIGH SENSITIVITY) - Abnormal; Notable for the following components:   Troponin I (High Sensitivity)  87 (*)    All other components within normal limits  BRAIN NATRIURETIC PEPTIDE  PATHOLOGIST SMEAR REVIEW     EKG  EKG shows sinus tachycardia normal axis normal intervals no acute ischemic changes   RADIOlOGY    PROCEDURES:  Critical Care performed: Yes, see critical care procedure note(s)  .1-3 Lead EKG Interpretation  Performed by: Rada Hay, MD Authorized by: Rada Hay, MD     Interpretation: abnormal     ECG rate assessment: tachycardic     Rhythm: sinus tachycardia     Ectopy: none     Conduction: normal   .Critical Care  Performed by: Rada Hay, MD Authorized by: Rada Hay, MD   Critical care provider statement:    Critical care time (minutes):  30   Critical care was time spent personally by me on the following activities:  Development of treatment plan with patient or surrogate, discussions with consultants, evaluation of patient's response to treatment, examination of patient, ordering and review of laboratory studies, ordering and review of radiographic studies, ordering and performing treatments and interventions, pulse oximetry, re-evaluation of patient's condition and review of old charts   The patient is on the cardiac monitor to evaluate for evidence of arrhythmia and/or significant heart rate changes.   MEDICATIONS ORDERED IN ED: Medications - No data to display   IMPRESSION / MDM / [place] / ED COURSE  I reviewed the triage vital signs and the nursing notes.                              Patient's presentation is most consistent with acute presentation with potential threat to life or bodily function.  Differential diagnosis includes, but is not limited to, ACS, aspiration, pulmonary embolism, pneumonia  Patient is a 88 year old female recently diagnosed with lung cancer who is not going to be undergoing treatment who presents unresponsive with increased work of breathing.  On arrival to the ED  patient has agonal respirations no response to pain GCS 3.  She is a DNR and recently diagnosed with lung cancer.  I spoke with patient's son Fara Olden immediately upon her arrival who did not want her to be intubated.  He would like comfort measures but her other son Aaron Edelman is on the way.  Patient placed on BiPAP to temporize.  Spoke with Aaron Edelman patient's son at bedside who would like to pursue comfort measures.  We will plan to take the BiPAP mask off put her on nasal cannula and give morphine for air hunger.  He would like to wait until the other brother Fara Olden gets here which is reasonable.  I suspect that patient will pass in the ED but may need admission for hospice.  I reviewed patient's labs which are notable for leukocytosis 24 elevated troponin.  Chest x-ray looking like pulmonary edema.    Not planning to escalate care for treatment of these issues given patient's goals of care.  Patient signed out to oncoming provider pending removal of bipap.   FINAL CLINICAL IMPRESSION(S) / ED DIAGNOSES   Final diagnoses:  Acute respiratory failure with hypoxia ([place])     Rx / DC Orders   ED Discharge Orders     None        Note:  This document was prepared using Dragon voice recognition software and may include unintentional dictation errors.   Rada Hay, MD 12/16/2021 1426

## 2021-12-23 NOTE — ED Provider Notes (Signed)
4:54 PM Assumed care for off going team.   Blood pressure (!) 124/56, pulse (!) 103, resp. rate (!) 34, SpO2 (!) 78 %.  See their HPI for full report but in brief I have placed consult for palliative care and I have called them but did not know if patient  I have consulted palliative care but they stated that they were not sure they would be able to come in time.  They will see her tomorrow.  I have reevaluated the patient and her sats are in the 80s but she remains tachycardic without any blood pressures being cycled due to end-of-life care.  I discussed with the family and we have started the transition to end-of-life care 2 hours ago.  Given we are unclear of when this will actually happen we discussed admission to the hospital to help with this transition.  They expressed understanding.       Vanessa Rockwood, MD 01/10/2022 [phone]

## 2021-12-23 NOTE — ED Notes (Signed)
2 family members remains with pt. Pt laying calmly on stretcher. Family denies any needs currently.

## 2021-12-25 ENCOUNTER — Other Ambulatory Visit: Payer: Medicare Other

## 2021-12-31 ENCOUNTER — Other Ambulatory Visit: Payer: Medicare Other

## 2022-01-02 ENCOUNTER — Ambulatory Visit: Payer: Medicare Other | Admitting: Internal Medicine

## 2022-01-02 ENCOUNTER — Ambulatory Visit: Payer: Medicare Other

## 2022-01-02 ENCOUNTER — Other Ambulatory Visit: Payer: Medicare Other

## 2022-01-03 ENCOUNTER — Ambulatory Visit: Payer: Medicare Other

## 2022-01-11 DIAGNOSIS — 419620001 Death: Secondary | SNOMED CT | POA: Diagnosis not present

## 2022-01-11 NOTE — Death Summary Note (Signed)
DEATH SUMMARY   Patient Details  Name: Margaret [name] MRN: [id number] DOB: 10/17/1930 TDD:UKGURK, Christean Grief, MD Admission/Discharge Information   Admit Date:  12-26-2021  Date of Death:    Time of Death:  8:04 am  Length of Stay: 1   Principle Cause of death: Acute on chronic hypoxic aspiratory failure secondary to lung cancer, pulmonary edema  Hospital Diagnoses: Principal Problem:   Comfort measures only status   Hospital Course: Margaret [name] is a 88 y.o. female with medical history significant of recently diagnosed left sided lung cancer, iron deficiency anemia, COPD on 2 L of oxygen at home, history of DVT, hypertension who was brought from home by the family after she was found to be unresponsive.  She was recently admitted here on 6/6 and was found to have severe anemia when she presented with hemoptysis, weakness, shortness of breath..  During that admission, she was diagnosed to have left upper and lower lobe lung masses consistent with primary lung malignancy.  Oncology was consulted at the time and she was discharged home with outpatient work-up plan.  Patient lives with her son.  She is usually ambulatory at baseline. She was seen' ok ' to this morning.  She was left by her family for half an hour and when they came back she was found to be unresponsive, gasping for breath.  Patient was immediately brought to the emergency department.  EMS found her hypoxic and put on nonrebreather but she remained minimally responsive.  On presentation, she was having agonal breathing.  Goals of care was discussed by ED physician with the family and they opted for comfort care.  Patient was briefly put on BiPAP which was later removed.  Patient was given morphine. Patient was started on full comfort care.  She expired peacefully this morning at 8: 04 AM  The results of significant diagnostics from this hospitalization (including imaging, microbiology, ancillary and laboratory) are listed below for  reference.   Significant Diagnostic Studies: DG Chest Portable 1 View  Result Date: 12-26-2021 CLINICAL DATA:  Shortness of breath EXAM: PORTABLE CHEST 1 VIEW COMPARISON:  Previous studies including the examination done on 12/19/2021 FINDINGS: Transverse diameter of heart is increased. Central pulmonary vessels are more prominent. There is increase in small bilateral pleural effusions. There is no pneumothorax. Increased interstitial markings are seen in the parahilar regions suggesting pulmonary edema. Previously seen nodular density in the left parahilar region is obscured. Patient's chin is partly obscuring the left apical region. IMPRESSION: Cardiomegaly. Central pulmonary vessels are more prominent suggesting CHF. Small bilateral pleural effusions with interval increase. Electronically Signed   By: Elmer Picker M.D.   On: 26-Dec-2021 13:56   CT CHEST ABDOMEN PELVIS W CONTRAST  Result Date: 12/30/2021 CLINICAL DATA:  Hemoptysis and shortness of breath x2 weeks. EXAM: CT CHEST, ABDOMEN, AND PELVIS WITH CONTRAST TECHNIQUE: Multidetector CT imaging of the chest, abdomen and pelvis was performed following the standard protocol during bolus administration of intravenous contrast. RADIATION DOSE REDUCTION: This exam was performed according to the departmental dose-optimization program which includes automated exposure control, adjustment of the mA and/or kV according to patient size and/or use of iterative reconstruction technique. CONTRAST:  58mL OMNIPAQUE IOHEXOL 300 MG/ML  SOLN COMPARISON:  July 13, 2019 FINDINGS: CT CHEST FINDINGS Cardiovascular: There is marked severity calcification of the thoracic aorta, without evidence of aortic aneurysm. Normal heart size. No pericardial effusion. Mediastinum/Nodes: No enlarged mediastinal, hilar, or axillary lymph nodes. Thyroid gland, trachea, and esophagus  demonstrate no significant findings. Lungs/Pleura: There is marked severity emphysematous lung  disease. Mild areas of linear scarring and/or atelectasis are seen within the anteromedial and posteromedial aspect of the right lung base and posterolateral aspect of the left lung base. A 7.2 cm x 4.0 cm x 8.3 cm heterogeneous, complex mass is seen within the anteromedial aspect of the left upper lobe. A 2.7 cm x 1.6 cm x 2.0 cm heterogeneous low-attenuation lung mass is also seen within the posterior aspect of the left lower lobe. There is a small left pleural effusion. No pneumothorax is identified. Musculoskeletal: Marked severity kyphosis of the mid and lower thoracic spine is seen. CT ABDOMEN PELVIS FINDINGS Hepatobiliary: No focal liver abnormality is seen. Status post cholecystectomy. No biliary dilatation. Pancreas: Unremarkable. No pancreatic ductal dilatation or surrounding inflammatory changes. Spleen: Normal in size without focal abnormality. Adrenals/Urinary Tract: Adrenal glands are unremarkable. Kidneys are normal, without renal calculi, focal lesion, or hydronephrosis. Bladder is unremarkable. Stomach/Bowel: Stomach is within normal limits. The appendix is surgically absent a large amount of stool is seen throughout the colon. No evidence of bowel dilatation. Noninflamed diverticula are seen throughout the large bowel. Vascular/Lymphatic: Aortic atherosclerosis. No enlarged abdominal or pelvic lymph nodes. Reproductive: Status post hysterectomy. No adnexal masses. Other: No abdominal wall hernia or abnormality. No abdominopelvic ascites. Musculoskeletal: There is grade 2 anterolisthesis of the L5 vertebral body on S1. IMPRESSION: 1. Marked severity emphysematous lung disease. 2. 7.2 cm x 4.0 cm x 8.3 cm left upper lobe lung mass consistent with a primary lung malignancy. 3. 2.7 cm x 1.6 cm x 2.0 cm left lower lobe lung mass consistent with a second neoplastic lesion. 4. Small left pleural effusion. 5. Colonic diverticulosis. 6. Grade 2 anterolisthesis of the L5 vertebral body on S1. Aortic  Atherosclerosis (ICD10-I70.0) and Emphysema (ICD10-J43.9). Electronically Signed   By: Virgina Norfolk M.D.   On: 01/09/2022 20:48   DG Chest 2 View  Result Date: 01/05/2022 CLINICAL DATA:  Shortness of breath. EXAM: CHEST - 2 VIEW COMPARISON:  Chest radiograph 07/11/2019. FINDINGS: Possible left perihilar nodule/mass. Small left pleural effusion. No visible pneumothorax. Emphysema. Similar cardiomediastinal silhouette. Exaggerated thoracic kyphosis with chronic compression fractures, similar. Osteopenia. IMPRESSION: 1. Possible left perihilar nodule/mass. Recommend CT chest (preferably with contrast) to further evaluate for malignancy. 2. Small left pleural effusion. 3. Emphysema. Electronically Signed   By: Margaretha Sheffield M.D.   On: 12/25/2021 15:28    Microbiology: No results found for this or any previous visit (from the past 240 hour(s)).  Time spent: 15 minutes  Signed: Shelly Coss, MD 01/19/22

## 2022-01-11 DEATH — deceased

## 2022-03-19 ENCOUNTER — Ambulatory Visit: Payer: Medicare Other | Admitting: Dermatology
